# Patient Record
Sex: Male | Born: 1955
Health system: Southern US, Community
[De-identification: ages and names within clinical notes are randomized; demographics above are authoritative.]

## PROBLEM LIST (undated history)

## (undated) DIAGNOSIS — M545 Low back pain, unspecified: Secondary | ICD-10-CM

## (undated) DIAGNOSIS — I219 Acute myocardial infarction, unspecified: Secondary | ICD-10-CM

## (undated) DIAGNOSIS — N400 Enlarged prostate without lower urinary tract symptoms: Secondary | ICD-10-CM

## (undated) DIAGNOSIS — M1990 Unspecified osteoarthritis, unspecified site: Secondary | ICD-10-CM

## (undated) DIAGNOSIS — I2510 Atherosclerotic heart disease of native coronary artery without angina pectoris: Secondary | ICD-10-CM

## (undated) DIAGNOSIS — E785 Hyperlipidemia, unspecified: Secondary | ICD-10-CM

## (undated) DIAGNOSIS — N189 Chronic kidney disease, unspecified: Secondary | ICD-10-CM

## (undated) DIAGNOSIS — Z8669 Personal history of other diseases of the nervous system and sense organs: Secondary | ICD-10-CM

## (undated) DIAGNOSIS — I10 Essential (primary) hypertension: Secondary | ICD-10-CM

## (undated) DIAGNOSIS — R3911 Hesitancy of micturition: Secondary | ICD-10-CM

## (undated) HISTORY — DX: Low back pain, unspecified: M54.50

## (undated) HISTORY — DX: Unspecified osteoarthritis, unspecified site: M19.90

## (undated) HISTORY — DX: Essential (primary) hypertension: I10

## (undated) HISTORY — DX: Chronic kidney disease, unspecified: N18.9

## (undated) HISTORY — DX: Benign prostatic hyperplasia without lower urinary tract symptoms: N40.0

## (undated) HISTORY — DX: Acute myocardial infarction, unspecified: I21.9

## (undated) HISTORY — DX: Hesitancy of micturition: R39.11

## (undated) HISTORY — PX: OTHER SURGICAL HISTORY: SHX169

## (undated) HISTORY — DX: Atherosclerotic heart disease of native coronary artery without angina pectoris: I25.10

## (undated) HISTORY — DX: Low back pain: M54.5

## (undated) HISTORY — DX: Personal history of other diseases of the nervous system and sense organs: Z86.69

## (undated) HISTORY — DX: Hyperlipidemia, unspecified: E78.5

---

## 2003-06-14 ENCOUNTER — Other Ambulatory Visit: Payer: Self-pay

## 2004-12-11 ENCOUNTER — Emergency Department: Payer: Self-pay | Admitting: Emergency Medicine

## 2004-12-23 ENCOUNTER — Other Ambulatory Visit: Payer: Self-pay

## 2004-12-23 ENCOUNTER — Inpatient Hospital Stay: Payer: Self-pay | Admitting: Internal Medicine

## 2010-03-12 ENCOUNTER — Inpatient Hospital Stay: Payer: Self-pay | Admitting: Internal Medicine

## 2010-03-28 ENCOUNTER — Ambulatory Visit: Payer: Self-pay | Admitting: Internal Medicine

## 2010-11-07 ENCOUNTER — Inpatient Hospital Stay: Payer: Self-pay | Admitting: Internal Medicine

## 2010-12-04 ENCOUNTER — Ambulatory Visit: Payer: Self-pay | Admitting: "Endocrinology

## 2011-02-03 ENCOUNTER — Emergency Department: Payer: Self-pay | Admitting: Emergency Medicine

## 2013-07-02 ENCOUNTER — Emergency Department: Payer: Self-pay | Admitting: Emergency Medicine

## 2014-02-09 ENCOUNTER — Ambulatory Visit: Payer: Self-pay | Admitting: Family Medicine

## 2014-02-25 ENCOUNTER — Encounter: Payer: Self-pay | Admitting: Family Medicine

## 2014-03-20 ENCOUNTER — Encounter: Payer: Self-pay | Admitting: Family Medicine

## 2014-04-19 ENCOUNTER — Encounter: Payer: Self-pay | Admitting: Family Medicine

## 2014-05-03 ENCOUNTER — Ambulatory Visit: Payer: Self-pay | Admitting: Gastroenterology

## 2014-05-03 LAB — HM COLONOSCOPY

## 2014-05-20 ENCOUNTER — Encounter: Payer: Self-pay | Admitting: Family Medicine

## 2014-05-30 DIAGNOSIS — M4802 Spinal stenosis, cervical region: Secondary | ICD-10-CM | POA: Diagnosis not present

## 2014-05-30 DIAGNOSIS — M1811 Unilateral primary osteoarthritis of first carpometacarpal joint, right hand: Secondary | ICD-10-CM | POA: Diagnosis not present

## 2014-06-03 DIAGNOSIS — M4802 Spinal stenosis, cervical region: Secondary | ICD-10-CM | POA: Diagnosis not present

## 2014-06-03 DIAGNOSIS — M1811 Unilateral primary osteoarthritis of first carpometacarpal joint, right hand: Secondary | ICD-10-CM | POA: Diagnosis not present

## 2014-06-17 DIAGNOSIS — M4802 Spinal stenosis, cervical region: Secondary | ICD-10-CM | POA: Diagnosis not present

## 2014-06-17 DIAGNOSIS — M1811 Unilateral primary osteoarthritis of first carpometacarpal joint, right hand: Secondary | ICD-10-CM | POA: Diagnosis not present

## 2014-06-20 ENCOUNTER — Encounter: Payer: Self-pay | Admitting: Family Medicine

## 2014-08-24 DIAGNOSIS — I429 Cardiomyopathy, unspecified: Secondary | ICD-10-CM | POA: Diagnosis not present

## 2014-08-24 DIAGNOSIS — I2510 Atherosclerotic heart disease of native coronary artery without angina pectoris: Secondary | ICD-10-CM | POA: Diagnosis not present

## 2014-08-24 DIAGNOSIS — F17200 Nicotine dependence, unspecified, uncomplicated: Secondary | ICD-10-CM | POA: Diagnosis not present

## 2014-08-24 DIAGNOSIS — E669 Obesity, unspecified: Secondary | ICD-10-CM | POA: Diagnosis not present

## 2014-09-12 LAB — SURGICAL PATHOLOGY

## 2014-09-16 DIAGNOSIS — I10 Essential (primary) hypertension: Secondary | ICD-10-CM | POA: Diagnosis not present

## 2014-09-16 DIAGNOSIS — I129 Hypertensive chronic kidney disease with stage 1 through stage 4 chronic kidney disease, or unspecified chronic kidney disease: Secondary | ICD-10-CM | POA: Diagnosis not present

## 2014-09-16 DIAGNOSIS — E785 Hyperlipidemia, unspecified: Secondary | ICD-10-CM | POA: Diagnosis not present

## 2014-09-16 DIAGNOSIS — N400 Enlarged prostate without lower urinary tract symptoms: Secondary | ICD-10-CM | POA: Diagnosis not present

## 2014-09-16 DIAGNOSIS — N183 Chronic kidney disease, stage 3 (moderate): Secondary | ICD-10-CM | POA: Diagnosis not present

## 2014-09-16 DIAGNOSIS — R3911 Hesitancy of micturition: Secondary | ICD-10-CM | POA: Diagnosis not present

## 2015-02-23 DIAGNOSIS — I2510 Atherosclerotic heart disease of native coronary artery without angina pectoris: Secondary | ICD-10-CM | POA: Diagnosis not present

## 2015-02-23 DIAGNOSIS — I429 Cardiomyopathy, unspecified: Secondary | ICD-10-CM | POA: Diagnosis not present

## 2015-02-23 DIAGNOSIS — F17200 Nicotine dependence, unspecified, uncomplicated: Secondary | ICD-10-CM | POA: Diagnosis not present

## 2015-02-23 DIAGNOSIS — R0602 Shortness of breath: Secondary | ICD-10-CM | POA: Diagnosis not present

## 2015-02-23 DIAGNOSIS — I10 Essential (primary) hypertension: Secondary | ICD-10-CM | POA: Diagnosis not present

## 2015-02-23 DIAGNOSIS — E784 Other hyperlipidemia: Secondary | ICD-10-CM | POA: Diagnosis not present

## 2015-02-23 DIAGNOSIS — I208 Other forms of angina pectoris: Secondary | ICD-10-CM | POA: Diagnosis not present

## 2015-02-23 DIAGNOSIS — Z87438 Personal history of other diseases of male genital organs: Secondary | ICD-10-CM | POA: Diagnosis not present

## 2015-03-20 ENCOUNTER — Other Ambulatory Visit: Payer: Self-pay | Admitting: Family Medicine

## 2015-03-20 ENCOUNTER — Ambulatory Visit (INDEPENDENT_AMBULATORY_CARE_PROVIDER_SITE_OTHER): Payer: Commercial Managed Care - HMO | Admitting: Family Medicine

## 2015-03-20 ENCOUNTER — Encounter: Payer: Self-pay | Admitting: Family Medicine

## 2015-03-20 VITALS — BP 115/70 | HR 56 | Temp 97.2°F | Ht 63.6 in | Wt 188.0 lb

## 2015-03-20 DIAGNOSIS — N183 Chronic kidney disease, stage 3 unspecified: Secondary | ICD-10-CM

## 2015-03-20 DIAGNOSIS — N400 Enlarged prostate without lower urinary tract symptoms: Secondary | ICD-10-CM | POA: Diagnosis not present

## 2015-03-20 DIAGNOSIS — R3911 Hesitancy of micturition: Secondary | ICD-10-CM

## 2015-03-20 DIAGNOSIS — E785 Hyperlipidemia, unspecified: Secondary | ICD-10-CM

## 2015-03-20 DIAGNOSIS — I129 Hypertensive chronic kidney disease with stage 1 through stage 4 chronic kidney disease, or unspecified chronic kidney disease: Secondary | ICD-10-CM | POA: Diagnosis not present

## 2015-03-20 DIAGNOSIS — Z0000 Encounter for general adult medical examination without abnormal findings: Secondary | ICD-10-CM

## 2015-03-20 DIAGNOSIS — I2510 Atherosclerotic heart disease of native coronary artery without angina pectoris: Secondary | ICD-10-CM

## 2015-03-20 DIAGNOSIS — Z113 Encounter for screening for infections with a predominantly sexual mode of transmission: Secondary | ICD-10-CM

## 2015-03-20 DIAGNOSIS — N401 Enlarged prostate with lower urinary tract symptoms: Secondary | ICD-10-CM

## 2015-03-20 DIAGNOSIS — M545 Low back pain, unspecified: Secondary | ICD-10-CM | POA: Insufficient documentation

## 2015-03-20 DIAGNOSIS — Z23 Encounter for immunization: Secondary | ICD-10-CM | POA: Diagnosis not present

## 2015-03-20 DIAGNOSIS — G8929 Other chronic pain: Secondary | ICD-10-CM | POA: Insufficient documentation

## 2015-03-20 DIAGNOSIS — M778 Other enthesopathies, not elsewhere classified: Secondary | ICD-10-CM

## 2015-03-20 DIAGNOSIS — M659 Synovitis and tenosynovitis, unspecified: Secondary | ICD-10-CM | POA: Diagnosis not present

## 2015-03-20 LAB — UA/M W/RFLX CULTURE, ROUTINE
Bilirubin, UA: NEGATIVE
Glucose, UA: NEGATIVE
KETONES UA: NEGATIVE
LEUKOCYTES UA: NEGATIVE
Nitrite, UA: NEGATIVE
PROTEIN UA: NEGATIVE
RBC UA: NEGATIVE
SPEC GRAV UA: 1.02 (ref 1.005–1.030)
Urobilinogen, Ur: 0.2 mg/dL (ref 0.2–1.0)
pH, UA: 5 (ref 5.0–7.5)

## 2015-03-20 LAB — MICROALBUMIN, URINE WAIVED
Creatinine, Urine Waived: 200 mg/dL (ref 10–300)
Microalb, Ur Waived: 30 mg/L — ABNORMAL HIGH (ref 0–19)
Microalb/Creat Ratio: <30 mg/g (ref <30)

## 2015-03-20 MED ORDER — LISINOPRIL 40 MG PO TABS: 40.0000 mg | ORAL_TABLET | Freq: Every day | ORAL | Status: DC

## 2015-03-20 MED ORDER — SIMVASTATIN 40 MG PO TABS: 40.0000 mg | ORAL_TABLET | Freq: Every day | ORAL | Status: DC

## 2015-03-20 MED ORDER — METOPROLOL TARTRATE 25 MG PO TABS: 25.0000 mg | ORAL_TABLET | Freq: Two times a day (BID) | ORAL | Status: DC

## 2015-03-20 MED ORDER — TAMSULOSIN HCL 0.4 MG PO CAPS: 0.4000 mg | ORAL_CAPSULE | Freq: Every day | ORAL | Status: DC

## 2015-03-20 NOTE — Assessment & Plan Note (Signed)
Under good control on current regimen. Continue current regimen. Continue to monitor. PSA checked today.

## 2015-03-20 NOTE — Assessment & Plan Note (Signed)
On lisinopril. Continue current regimen. Continue to monitor.

## 2015-03-20 NOTE — Assessment & Plan Note (Signed)
Under good control on current regimen. Continue current regimen. Continue to monitor.  

## 2015-03-20 NOTE — Assessment & Plan Note (Signed)
Continue to follow with cardiology. Stable at this time.

## 2015-03-20 NOTE — Assessment & Plan Note (Signed)
Continue flomax. Continue to monitor.

## 2015-03-20 NOTE — Progress Notes (Signed)
BP 115/70 mmHg  Pulse 56  Temp(Src) 97.2 F (36.2 C)  Ht 5' 3.6" (1.615 m)  Wt 188 lb (85.276 kg)  BMI 32.70 kg/m2  SpO2 99%   Subjective:    Patient ID: Mike [name]., male    DOB: April 01, 1956, 59 y.o.   MRN: [id number]  HPI: [place] Norberto Nair [name] is a 59 y.o. male presenting on 03/20/2015 for comprehensive medical examination. Current medical complaints include:  Has been having some tendonitis in his elbows and his low back. Has been taking aleve for it at night  He currently lives with: friend Interim Problems from his last visit: no  Depression Screen done today and results listed below:  Depression screen [facility] 2/9 03/20/2015  Decreased Interest 0  Down, Depressed, Hopeless 0  PHQ - 2 Score 0    The patient does not have a history of falls. I did not complete a risk assessment for falls. A plan of care for falls was not documented.  Past Medical History:  Past Medical History  Diagnosis Date  . Hyperlipidemia   . Hypertension   . CAD (coronary artery disease)   . Urinary hesitancy   . Lumbago   . Chronic kidney disease   . Benign enlargement of prostate   . Arthritis   . Myocardial infarction ([place])   . History of seizures as a child     Due to head injury    Surgical History:  Past Surgical History  Procedure Laterality Date  . Coronary artery stent      Medications:  Current Outpatient Prescriptions on File Prior to Visit  Medication Sig  . EPINEPHrine (EPIPEN 2-PAK) 0.3 mg/0.3 mL IJ SOAJ injection Inject into the muscle once.   No current facility-administered medications on file prior to visit.    Allergies:  Allergies  Allergen Reactions  . Shellfish Allergy Anaphylaxis  . Peanuts [Peanut Oil] Rash  . Geralyn Flash [Fish Allergy] Hives    Social History:  Social History   Social History  . Marital Status: Single    Spouse Name: N/A  . Number of Children: N/A  . Years of Education: N/A   Occupational History  . Not on file.    Social History Main Topics  . Smoking status: Current Every Day Smoker -- 0.50 packs/day    Types: Cigarettes  . Smokeless tobacco: Never Used  . Alcohol Use: No  . Drug Use: No  . Sexual Activity: Yes    Birth Control/ Protection: None   Other Topics Concern  . Not on file   Social History Narrative   History  Smoking status  . Current Every Day Smoker -- 0.50 packs/day  . Types: Cigarettes  Smokeless tobacco  . Never Used   History  Alcohol Use No    Family History:  Family History  Problem Relation Age of Onset  . Cancer Mother     breast  . Hypertension Mother   . Hypertension Father   . Stroke Father   . Stroke Maternal Grandfather     Past medical history, surgical history, medications, allergies, family history and social history reviewed with patient today and changes made to appropriate areas of the chart.   Review of Systems  Constitutional: Negative.   HENT: Negative.   Eyes: Negative.   Respiratory: Negative.   Cardiovascular: Negative.   Gastrointestinal: Positive for constipation. Negative for heartburn, nausea, vomiting, abdominal pain, diarrhea, blood in stool and melena.  Genitourinary: Negative.  Hesitancy- flomax helps but not a whole lot, slow stream   Musculoskeletal: Negative.   Skin: Negative.   Neurological: Negative.   Endo/Heme/Allergies: Negative.   Psychiatric/Behavioral: Negative.     All other ROS negative except what is listed above and in the HPI.      Objective:    BP 115/70 mmHg  Pulse 56  Temp(Src) 97.2 F (36.2 C)  Ht 5' 3.6" (1.615 m)  Wt 188 lb (85.276 kg)  BMI 32.70 kg/m2  SpO2 99%  Wt Readings from Last 3 Encounters:  03/20/15 188 lb (85.276 kg)  09/16/14 195 lb (88.451 kg)    Physical Exam  Constitutional: He is oriented to person, place, and time. He appears well-developed and well-nourished. No distress.  HENT:  Head: Normocephalic and atraumatic.  Right Ear: Hearing and external ear  normal.  Left Ear: Hearing and external ear normal.  Nose: Nose normal.  Mouth/Throat: Oropharynx is clear and moist. No oropharyngeal exudate.  Eyes: Conjunctivae, EOM and lids are normal. Pupils are equal, round, and reactive to light. Right eye exhibits no discharge. Left eye exhibits no discharge. No scleral icterus.  Neck: Normal range of motion. Neck supple. No JVD present. No tracheal deviation present. No thyromegaly present.  Cardiovascular: Normal rate, regular rhythm, normal heart sounds and intact distal pulses.  Exam reveals no gallop and no friction rub.   No murmur heard. Pulmonary/Chest: Effort normal and breath sounds normal. No stridor. No respiratory distress.  Abdominal: Soft. Bowel sounds are normal. He exhibits no distension and no mass. There is no tenderness. There is no rebound and no guarding. Hernia confirmed negative in the right inguinal area and confirmed negative in the left inguinal area.  Genitourinary: Rectum normal, testes normal and penis normal. Prostate is enlarged. Prostate is not tender. Cremasteric reflex is present. Circumcised. No phimosis, hypospadias or penile tenderness.  Musculoskeletal: Normal range of motion. He exhibits no edema or tenderness.  Lymphadenopathy:    He has no cervical adenopathy.       Right: No inguinal adenopathy present.       Left: No inguinal adenopathy present.  Neurological: He is alert and oriented to person, place, and time. He has normal reflexes. He displays normal reflexes. No cranial nerve deficit. He exhibits normal muscle tone. Coordination normal.  Skin: Skin is warm, dry and intact. No rash noted. He is not diaphoretic. No erythema. No pallor.  Psychiatric: He has a normal mood and affect. His speech is normal and behavior is normal. Judgment and thought content normal. Cognition and memory are normal.  Nursing note and vitals reviewed.      Assessment & Plan:   Problem List Items Addressed This Visit       Cardiovascular and Mediastinum   CAD (coronary artery disease)    Continue to follow with cardiology. Stable at this time.       Relevant Medications   aspirin EC 81 MG tablet   lisinopril (PRINIVIL,ZESTRIL) 40 MG tablet   metoprolol tartrate (LOPRESSOR) 25 MG tablet   simvastatin (ZOCOR) 40 MG tablet     Genitourinary   BPH (benign prostatic hyperplasia)    Under good control on current regimen. Continue current regimen. Continue to monitor. PSA checked today.      Relevant Medications   tamsulosin (FLOMAX) 0.4 MG CAPS capsule   Benign hypertensive renal disease    Under good control on current regimen. Continue current regimen. Continue to monitor .      CKD (chronic  kidney disease), stage III    On lisinopril. Continue current regimen. Continue to monitor.         Other   HLD (hyperlipidemia)    Checking levels today. Await results. Continue to monitor.       Relevant Medications   aspirin EC 81 MG tablet   lisinopril (PRINIVIL,ZESTRIL) 40 MG tablet   metoprolol tartrate (LOPRESSOR) 25 MG tablet   simvastatin (ZOCOR) 40 MG tablet   Urinary hesitancy due to benign prostatic hyperplasia    Continue flomax. Continue to monitor.        Other Visit Diagnoses    Routine general medical examination at a health care facility    -  Primary    Up to date on vaccines. Colonoscopy up to date. Screening labs checked today. Aw    Routine screening for STI (sexually transmitted infection)        HIV and hep C checked today. Await results.     Relevant Orders    HIV antibody    Hepatitis C Antibody    Immunization due        Flu shot given today.    Relevant Orders    Flu Vaccine QUAD 36+ mos PF IM (Fluarix & Fluzone Quad PF)    Right elbow tendonitis        Due to lifting. Rx for brace given. Call if not getting better or getting worse.         Discussed aspirin prophylaxis for myocardial infarction prevention and decision was made to continue ASA  LABORATORY TESTING:   Health maintenance labs ordered today as discussed above.   The natural history of prostate cancer and ongoing controversy regarding screening and potential treatment outcomes of prostate cancer has been discussed with the patient. The meaning of a false positive PSA and a false negative PSA has been discussed. He indicates understanding of the limitations of this screening test and wishes to proceed with screening PSA testing.   IMMUNIZATIONS:   - Tdap: Tetanus vaccination status reviewed: last tetanus booster within 10 years. - Influenza: Administered today - Pneumovax: Up to date - Prevnar: Not applicable - Zostavax vaccine: Rx given today to have at the pharmacy  SCREENING: - Colonoscopy: Up to date  Discussed with patient purpose of the colonoscopy is to detect colon cancer at curable precancerous or early stages   - AAA Screening: Not applicable   PATIENT COUNSELING:    Sexuality: Discussed sexually transmitted diseases, partner selection, use of condoms, avoidance of unintended pregnancy  and contraceptive alternatives.   Advised to avoid cigarette smoking.  I discussed with the patient that most people either abstain from alcohol or drink within safe limits (<=14/week and <=4 drinks/occasion for males, <=7/weeks and <= 3 drinks/occasion for females) and that the risk for alcohol disorders and other health effects rises proportionally with the number of drinks per week and how often a drinker exceeds daily limits.  Discussed cessation/primary prevention of drug use and availability of treatment for abuse.   Diet: Encouraged to adjust caloric intake to maintain  or achieve ideal body weight, to reduce intake of dietary saturated fat and total fat, to limit sodium intake by avoiding high sodium foods and not adding table salt, and to maintain adequate dietary potassium and calcium preferably from fresh fruits, vegetables, and low-fat dairy products.    stressed the importance of  regular exercise  Injury prevention: Discussed safety belts, safety helmets, smoke detector, smoking near bedding or upholstery.  Dental health: Discussed importance of regular tooth brushing, flossing, and dental visits.   Follow up plan: NEXT PREVENTATIVE PHYSICAL DUE IN 1 YEAR. Return in about 6 months (around 09/17/2015) for Follow up BP and cholesterol.

## 2015-03-20 NOTE — Assessment & Plan Note (Signed)
Checking levels today. Await results. Continue to monitor.  

## 2015-03-20 NOTE — Patient Instructions (Signed)
Preventive Care for Adults, Male A healthy lifestyle and preventive care can promote health and wellness. Preventive health guidelines for men include the following key practices:  A routine yearly physical is a good way to check with your health care provider about your health and preventative screening. It is a chance to share any concerns and updates on your health and to receive a thorough exam.  Visit your dentist for a routine exam and preventative care every 6 months. Brush your teeth twice a day and floss once a day. Good oral hygiene prevents tooth decay and gum disease.  The frequency of eye exams is based on your age, health, family medical history, use of contact lenses, and other factors. Follow your health care provider's recommendations for frequency of eye exams.  Eat a healthy diet. Foods such as vegetables, fruits, whole grains, low-fat dairy products, and lean protein foods contain the nutrients you need without too many calories. Decrease your intake of foods high in solid fats, added sugars, and salt. Eat the right amount of calories for you.Get information about a proper diet from your health care provider, if necessary.  Regular physical exercise is one of the most important things you can do for your health. Most adults should get at least 150 minutes of moderate-intensity exercise (any activity that increases your heart rate and causes you to sweat) each week. In addition, most adults need muscle-strengthening exercises on 2 or more days a week.  Maintain a healthy weight. The body mass index (BMI) is a screening tool to identify possible weight problems. It provides an estimate of body fat based on height and weight. Your health care provider can find your BMI and can help you achieve or maintain a healthy weight.For adults 20 years and older:  A BMI below 18.5 is considered underweight.  A BMI of 18.5 to 24.9 is normal.  A BMI of 25 to 29.9 is considered  overweight.  A BMI of 30 and above is considered obese.  Maintain normal blood lipids and cholesterol levels by exercising and minimizing your intake of saturated fat. Eat a balanced diet with plenty of fruit and vegetables. Blood tests for lipids and cholesterol should begin at age 20 and be repeated every 5 years. If your lipid or cholesterol levels are high, you are over 50, or you are at high risk for heart disease, you may need your cholesterol levels checked more frequently.Ongoing high lipid and cholesterol levels should be treated with medicines if diet and exercise are not working.  If you smoke, find out from your health care provider how to quit. If you do not use tobacco, do not start.  Lung cancer screening is recommended for adults aged 55-80 years who are at high risk for developing lung cancer because of a history of smoking. A yearly low-dose CT scan of the lungs is recommended for people who have at least a 30-pack-year history of smoking and are a current smoker or have quit within the past 15 years. A pack year of smoking is smoking an average of 1 pack of cigarettes a day for 1 year (for example: 1 pack a day for 30 years or 2 packs a day for 15 years). Yearly screening should continue until the smoker has stopped smoking for at least 15 years. Yearly screening should be stopped for people who develop a health problem that would prevent them from having lung cancer treatment.  If you choose to drink alcohol, do not have more   than 2 drinks per day. One drink is considered to be 12 ounces (355 mL) of beer, 5 ounces (148 mL) of wine, or 1.5 ounces (44 mL) of liquor.  Avoid use of street drugs. Do not share needles with anyone. Ask for help if you need support or instructions about stopping the use of drugs.  High blood pressure causes heart disease and increases the risk of stroke. Your blood pressure should be checked at least every 1-2 years. Ongoing high blood pressure should be  treated with medicines, if weight loss and exercise are not effective.  If you are 34-90 years old, ask your health care provider if you should take aspirin to prevent heart disease.  Diabetes screening is done by taking a blood sample to check your blood glucose level after you have not eaten for a certain period of time (fasting). If you are not overweight and you do not have risk factors for diabetes, you should be screened once every 3 years starting at age 35. If you are overweight or obese and you are 70-84 years of age, you should be screened for diabetes every year as part of your cardiovascular risk assessment.  Colorectal cancer can be detected and often prevented. Most routine colorectal cancer screening begins at the age of 18 and continues through age 69. However, your health care provider may recommend screening at an earlier age if you have risk factors for colon cancer. On a yearly basis, your health care provider may provide home test kits to check for hidden blood in the stool. Use of a small camera at the end of a tube to directly examine the colon (sigmoidoscopy or colonoscopy) can detect the earliest forms of colorectal cancer. Talk to your health care provider about this at age 71, when routine screening begins. Direct exam of the colon should be repeated every 5-10 years through age 18, unless early forms of precancerous polyps or small growths are found.  People who are at an increased risk for hepatitis B should be screened for this virus. You are considered at high risk for hepatitis B if:  You were born in a country where hepatitis B occurs often. Talk with your health care provider about which countries are considered high risk.  Your parents were born in a high-risk country and you have not received a shot to protect against hepatitis B (hepatitis B vaccine).  You have HIV or AIDS.  You use needles to inject street drugs.  You live with, or have sex with, someone who  has hepatitis B.  You are a man who has sex with other men (MSM).  You get hemodialysis treatment.  You take certain medicines for conditions such as cancer, organ transplantation, and autoimmune conditions.  Hepatitis C blood testing is recommended for all people born from [birth year] through 1965 and any individual with known risks for hepatitis C.  Practice safe sex. Use condoms and avoid high-risk sexual practices to reduce the spread of sexually transmitted infections (STIs). STIs include gonorrhea, chlamydia, syphilis, trichomonas, herpes, HPV, and human immunodeficiency virus (HIV). Herpes, HIV, and HPV are viral illnesses that have no cure. They can result in disability, cancer, and death.  If you are a man who has sex with other men, you should be screened at least once per year for:  HIV.  Urethral, rectal, and pharyngeal infection of gonorrhea, chlamydia, or both.  If you are at risk of being infected with HIV, it is recommended that you take a  prescription medicine daily to prevent HIV infection. This is called preexposure prophylaxis (PrEP). You are considered at risk if:  You are a man who has sex with other men (MSM) and have other risk factors.  You are a heterosexual man, are sexually active, and are at increased risk for HIV infection.  You take drugs by injection.  You are sexually active with a partner who has HIV.  Talk with your health care provider about whether you are at high risk of being infected with HIV. If you choose to begin PrEP, you should first be tested for HIV. You should then be tested every 3 months for as long as you are taking PrEP.  A one-time screening for abdominal aortic aneurysm (AAA) and surgical repair of large AAAs by ultrasound are recommended for men ages 44 to 66 years who are current or former smokers.  Healthy men should no longer receive prostate-specific antigen (PSA) blood tests as part of routine cancer screening. Talk with your health  care provider about prostate cancer screening.  Testicular cancer screening is not recommended for adult males who have no symptoms. Screening includes self-exam, a health care provider exam, and other screening tests. Consult with your health care provider about any symptoms you have or any concerns you have about testicular cancer.  Use sunscreen. Apply sunscreen liberally and repeatedly throughout the day. You should seek shade when your shadow is shorter than you. Protect yourself by wearing long sleeves, pants, a wide-brimmed hat, and sunglasses year round, whenever you are outdoors.  Once a month, do a whole-body skin exam, using a mirror to look at the skin on your back. Tell your health care provider about new moles, moles that have irregular borders, moles that are larger than a pencil eraser, or moles that have changed in shape or color.  Stay current with required vaccines (immunizations).  Influenza vaccine. All adults should be immunized every year.  Tetanus, diphtheria, and acellular pertussis (Td, Tdap) vaccine. An adult who has not previously received Tdap or who does not know his vaccine status should receive 1 dose of Tdap. This initial dose should be followed by tetanus and diphtheria toxoids (Td) booster doses every 10 years. Adults with an unknown or incomplete history of completing a 3-dose immunization series with Td-containing vaccines should begin or complete a primary immunization series including a Tdap dose. Adults should receive a Td booster every 10 years.  Varicella vaccine. An adult without evidence of immunity to varicella should receive 2 doses or a second dose if he has previously received 1 dose.  Human papillomavirus (HPV) vaccine. Males aged 11-21 years who have not received the vaccine previously should receive the 3-dose series. Males aged 22-26 years may be immunized. Immunization is recommended through the age of 23 years for any male who has sex with males  and did not get any or all doses earlier. Immunization is recommended for any person with an immunocompromised condition through the age of 72 years if he did not get any or all doses earlier. During the 3-dose series, the second dose should be obtained 4-8 weeks after the first dose. The third dose should be obtained 24 weeks after the first dose and 16 weeks after the second dose.  Zoster vaccine. One dose is recommended for adults aged 23 years or older unless certain conditions are present.  Measles, mumps, and rubella (MMR) vaccine. Adults born before [birth year] generally are considered immune to measles and mumps. Adults born in [birth year]  or later should have 1 or more doses of MMR vaccine unless there is a contraindication to the vaccine or there is laboratory evidence of immunity to each of the three diseases. A routine second dose of MMR vaccine should be obtained at least 28 days after the first dose for students attending postsecondary schools, health care workers, or international travelers. People who received inactivated measles vaccine or an unknown type of measles vaccine during 1963-1967 should receive 2 doses of MMR vaccine. People who received inactivated mumps vaccine or an unknown type of mumps vaccine before 1979 and are at high risk for mumps infection should consider immunization with 2 doses of MMR vaccine. Unvaccinated health care workers born before [birth year] who lack laboratory evidence of measles, mumps, or rubella immunity or laboratory confirmation of disease should consider measles and mumps immunization with 2 doses of MMR vaccine or rubella immunization with 1 dose of MMR vaccine.  Pneumococcal 13-valent conjugate (PCV13) vaccine. When indicated, a person who is uncertain of his immunization history and has no record of immunization should receive the PCV13 vaccine. All adults 9 years of age and older should receive this vaccine. An adult aged 69 years or older who has certain medical  conditions and has not been previously immunized should receive 1 dose of PCV13 vaccine. This PCV13 should be followed with a dose of pneumococcal polysaccharide (PPSV23) vaccine. Adults who are at high risk for pneumococcal disease should obtain the PPSV23 vaccine at least 8 weeks after the dose of PCV13 vaccine. Adults older than 60 years of age who have normal immune system function should obtain the PPSV23 vaccine dose at least 1 year after the dose of PCV13 vaccine.  Pneumococcal polysaccharide (PPSV23) vaccine. When PCV13 is also indicated, PCV13 should be obtained first. All adults aged 79 years and older should be immunized. An adult younger than age 43 years who has certain medical conditions should be immunized. Any person who resides in a nursing home or long-term care facility should be immunized. An adult smoker should be immunized. People with an immunocompromised condition and certain other conditions should receive both PCV13 and PPSV23 vaccines. People with human immunodeficiency virus (HIV) infection should be immunized as soon as possible after diagnosis. Immunization during chemotherapy or radiation therapy should be avoided. Routine use of PPSV23 vaccine is not recommended for American Indians, [place] Natives, or people younger than 65 years unless there are medical conditions that require PPSV23 vaccine. When indicated, people who have unknown immunization and have no record of immunization should receive PPSV23 vaccine. One-time revaccination 5 years after the first dose of PPSV23 is recommended for people aged 19-64 years who have chronic kidney failure, nephrotic syndrome, asplenia, or immunocompromised conditions. People who received 1-2 doses of PPSV23 before age 70 years should receive another dose of PPSV23 vaccine at age 79 years or later if at least 5 years have passed since the previous dose. Doses of PPSV23 are not needed for people immunized with PPSV23 at or after age 55  years.  Meningococcal vaccine. Adults with asplenia or persistent complement component deficiencies should receive 2 doses of quadrivalent meningococcal conjugate (MenACWY-D) vaccine. The doses should be obtained at least 2 months apart. Microbiologists working with certain meningococcal bacteria, [place] recruits, people at risk during an outbreak, and people who travel to or live in countries with a high rate of meningitis should be immunized. A first-year college student up through age 64 years who is living in a residence hall should receive a  dose if he did not receive a dose on or after his 16th birthday. Adults who have certain high-risk conditions should receive one or more doses of vaccine.  Hepatitis A vaccine. Adults who wish to be protected from this disease, have chronic liver disease, work with hepatitis A-infected animals, work in hepatitis A research labs, or travel to or work in countries with a high rate of hepatitis A should be immunized. Adults who were previously unvaccinated and who anticipate close contact with an international adoptee during the first 60 days after arrival in the [place] from a country with a high rate of hepatitis A should be immunized.  Hepatitis B vaccine. Adults should be immunized if they wish to be protected from this disease, are under age 34 years and have diabetes, have chronic liver disease, have had more than one sex partner in the past 6 months, may be exposed to blood or other infectious body fluids, are household contacts or sex partners of hepatitis B positive people, are clients or workers in certain care facilities, or travel to or work in countries with a high rate of hepatitis B.  Haemophilus influenzae type b (Hib) vaccine. A previously unvaccinated person with asplenia or sickle cell disease or having a scheduled splenectomy should receive 1 dose of Hib vaccine. Regardless of previous immunization, a recipient of a hematopoietic stem cell  transplant should receive a 3-dose series 6-12 months after his successful transplant. Hib vaccine is not recommended for adults with HIV infection. Preventive Service / Frequency Ages 77 to 55  Blood pressure check.** / Every 3-5 years.  Lipid and cholesterol check.** / Every 5 years beginning at age 66.  Hepatitis C blood test.** / For any individual with known risks for hepatitis C.  Skin self-exam. / Monthly.  Influenza vaccine. / Every year.  Tetanus, diphtheria, and acellular pertussis (Tdap, Td) vaccine.** / Consult your health care provider. 1 dose of Td every 10 years.  Varicella vaccine.** / Consult your health care provider.  HPV vaccine. / 3 doses over 6 months, if 45 or younger.  Measles, mumps, rubella (MMR) vaccine.** / You need at least 1 dose of MMR if you were born in 1957 or later. You may also need a second dose.  Pneumococcal 13-valent conjugate (PCV13) vaccine.** / Consult your health care provider.  Pneumococcal polysaccharide (PPSV23) vaccine.** / 1 to 2 doses if you smoke cigarettes or if you have certain conditions.  Meningococcal vaccine.** / 1 dose if you are age 81 to 79 years and a [employer] researcher living in a residence hall, or have one of several medical conditions. You may also need additional booster doses.  Hepatitis A vaccine.** / Consult your health care provider.  Hepatitis B vaccine.** / Consult your health care provider.  Haemophilus influenzae type b (Hib) vaccine.** / Consult your health care provider. Ages 6 to 58  Blood pressure check.** / Every year.  Lipid and cholesterol check.** / Every 5 years beginning at age 89.  Lung cancer screening. / Every year if you are aged 84-80 years and have a 30-pack-year history of smoking and currently smoke or have quit within the past 15 years. Yearly screening is stopped once you have quit smoking for at least 15 years or develop a health problem that would prevent you from having  lung cancer treatment.  Fecal occult blood test (FOBT) of stool. / Every year beginning at age 90 and continuing until age 73. You may not have to do  this test if you get a colonoscopy every 10 years.  Flexible sigmoidoscopy** or colonoscopy.** / Every 5 years for a flexible sigmoidoscopy or every 10 years for a colonoscopy beginning at age 50 and continuing until age 75.  Hepatitis C blood test.** / For all people born from 1945 through 1965 and any individual with known risks for hepatitis C.  Skin self-exam. / Monthly.  Influenza vaccine. / Every year.  Tetanus, diphtheria, and acellular pertussis (Tdap/Td) vaccine.** / Consult your health care provider. 1 dose of Td every 10 years.  Varicella vaccine.** / Consult your health care provider.  Zoster vaccine.** / 1 dose for adults aged 60 years or older.  Measles, mumps, rubella (MMR) vaccine.** / You need at least 1 dose of MMR if you were born in 1957 or later. You may also need a second dose.  Pneumococcal 13-valent conjugate (PCV13) vaccine.** / Consult your health care provider.  Pneumococcal polysaccharide (PPSV23) vaccine.** / 1 to 2 doses if you smoke cigarettes or if you have certain conditions.  Meningococcal vaccine.** / Consult your health care provider.  Hepatitis A vaccine.** / Consult your health care provider.  Hepatitis B vaccine.** / Consult your health care provider.  Haemophilus influenzae type b (Hib) vaccine.** / Consult your health care provider. Ages 65 and over  Blood pressure check.** / Every year.  Lipid and cholesterol check.**/ Every 5 years beginning at age 20.  Lung cancer screening. / Every year if you are aged 55-80 years and have a 30-pack-year history of smoking and currently smoke or have quit within the past 15 years. Yearly screening is stopped once you have quit smoking for at least 15 years or develop a health problem that would prevent you from having lung cancer treatment.  Fecal  occult blood test (FOBT) of stool. / Every year beginning at age 50 and continuing until age 75. You may not have to do this test if you get a colonoscopy every 10 years.  Flexible sigmoidoscopy** or colonoscopy.** / Every 5 years for a flexible sigmoidoscopy or every 10 years for a colonoscopy beginning at age 50 and continuing until age 75.  Hepatitis C blood test.** / For all people born from 1945 through 1965 and any individual with known risks for hepatitis C.  Abdominal aortic aneurysm (AAA) screening.** / A one-time screening for ages 65 to 75 years who are current or former smokers.  Skin self-exam. / Monthly.  Influenza vaccine. / Every year.  Tetanus, diphtheria, and acellular pertussis (Tdap/Td) vaccine.** / 1 dose of Td every 10 years.  Varicella vaccine.** / Consult your health care provider.  Zoster vaccine.** / 1 dose for adults aged 60 years or older.  Pneumococcal 13-valent conjugate (PCV13) vaccine.** / 1 dose for all adults aged 65 years and older.  Pneumococcal polysaccharide (PPSV23) vaccine.** / 1 dose for all adults aged 65 years and older.  Meningococcal vaccine.** / Consult your health care provider.  Hepatitis A vaccine.** / Consult your health care provider.  Hepatitis B vaccine.** / Consult your health care provider.  Haemophilus influenzae type b (Hib) vaccine.** / Consult your health care provider. **Family history and personal history of risk and conditions may change your health care provider's recommendations.   This information is not intended to replace advice given to you by your health care provider. Make sure you discuss any questions you have with your health care provider.   Document Released: 07/02/2001 Document Revised: 05/27/2014 Document Reviewed: 10/01/2010 Elsevier Interactive Patient Education 2016   [place] Can Quit Smoking If you are ready to quit smoking or are thinking about it, congratulations! You have chosen to help  yourself be healthier and live longer! There are lots of different ways to quit smoking. Nicotine gum, nicotine patches, a nicotine inhaler, or nicotine nasal spray can help with physical craving. Hypnosis, support groups, and medicines help break the habit of smoking. TIPS TO GET OFF AND STAY OFF CIGARETTES  Learn to predict your moods. Do not let a bad situation be your excuse to have a cigarette. Some situations in your life might tempt you to have a cigarette.  Ask friends and co-workers not to smoke around you.  Make your home smoke-free.  Never have "just one" cigarette. It leads to wanting another and another. Remind yourself of your decision to quit.  On a card, make a list of your reasons for not smoking. Read it at least the same number of times a day as you have a cigarette. Tell yourself everyday, "I do not want to smoke. I choose not to smoke."  Ask someone at home or work to help you with your plan to quit smoking.  Have something planned after you eat or have a cup of coffee. Take a walk or get other exercise to perk you up. This will help to keep you from overeating.  Try a relaxation exercise to calm you down and decrease your stress. Remember, you may be tense and nervous the first two weeks after you quit. This will pass.  Find new activities to keep your hands busy. Play with a pen, coin, or rubber band. Doodle or draw things on paper.  Brush your teeth right after eating. This will help cut down the craving for the taste of tobacco after meals. You can try mouthwash too.  Try gum, breath mints, or diet candy to keep something in your mouth. IF YOU SMOKE AND WANT TO QUIT:  Do not stock up on cigarettes. Never buy a carton. Wait until one pack is finished before you buy another.  Never carry cigarettes with you at work or at home.  Keep cigarettes as far away from you as possible. Leave them with someone else.  Never carry matches or a lighter with you.  Ask  yourself, "Do I need this cigarette or is this just a reflex?"  Bet with someone that you can quit. Put cigarette money in a piggy bank every morning. If you smoke, you give up the money. If you do not smoke, by the end of the week, you keep the money.  Keep trying. It takes 21 days to change a habit!  Talk to your doctor about using medicines to help you quit. These include nicotine replacement gum, lozenges, or skin patches.   This information is not intended to replace advice given to you by your health care provider. Make sure you discuss any questions you have with your health care provider.   Document Released: 03/02/2009 Document Revised: 07/29/2011 Document Reviewed: 03/02/2009 Elsevier Interactive Patient Education [employer].

## 2015-03-21 ENCOUNTER — Encounter: Payer: Self-pay | Admitting: Family Medicine

## 2015-03-21 LAB — CBC WITH DIFFERENTIAL/PLATELET
Basophils Absolute: 0.1 10*3/uL (ref 0.0–0.2)
Basos: 1 %
EOS (ABSOLUTE): 0.6 10*3/uL — ABNORMAL HIGH (ref 0.0–0.4)
EOS: 9 %
HEMATOCRIT: 43.2 % (ref 37.5–51.0)
Hemoglobin: 14.6 g/dL (ref 12.6–17.7)
Immature Grans (Abs): 0 10*3/uL (ref 0.0–0.1)
Immature Granulocytes: 0 %
LYMPHS ABS: 2.3 10*3/uL (ref 0.7–3.1)
Lymphs: 33 %
MCH: 26.2 pg — ABNORMAL LOW (ref 26.6–33.0)
MCHC: 33.8 g/dL (ref 31.5–35.7)
MCV: 78 fL — ABNORMAL LOW (ref 79–97)
MONOS ABS: 0.6 10*3/uL (ref 0.1–0.9)
Monocytes: 9 %
Neutrophils Absolute: 3.2 10*3/uL (ref 1.4–7.0)
Neutrophils: 48 %
Platelets: 192 10*3/uL (ref 150–379)
RBC: 5.57 x10E6/uL (ref 4.14–5.80)
RDW: 14.5 % (ref 12.3–15.4)
WBC: 6.8 10*3/uL (ref 3.4–10.8)

## 2015-03-21 LAB — COMPREHENSIVE METABOLIC PANEL
ALBUMIN: 4.1 g/dL (ref 3.6–4.8)
ALT: 14 IU/L (ref 0–44)
AST: 24 IU/L (ref 0–40)
Albumin/Globulin Ratio: 1.6 (ref 1.1–2.5)
Alkaline Phosphatase: 144 IU/L — ABNORMAL HIGH (ref 39–117)
BILIRUBIN TOTAL: 0.4 mg/dL (ref 0.0–1.2)
BUN / CREAT RATIO: 14 (ref 10–22)
BUN: 20 mg/dL (ref 8–27)
CO2: 23 mmol/L (ref 18–29)
CREATININE: 1.39 mg/dL — AB (ref 0.76–1.27)
Calcium: 9.2 mg/dL (ref 8.6–10.2)
Chloride: 101 mmol/L (ref 97–106)
GFR calc non Af Amer: 55 mL/min/{1.73_m2} — ABNORMAL LOW (ref >59)
GFR, EST AFRICAN AMERICAN: 63 mL/min/{1.73_m2} (ref >59)
GLOBULIN, TOTAL: 2.5 g/dL (ref 1.5–4.5)
Glucose: 90 mg/dL (ref 65–99)
Potassium: 4.4 mmol/L (ref 3.5–5.2)
SODIUM: 139 mmol/L (ref 136–144)
TOTAL PROTEIN: 6.6 g/dL (ref 6.0–8.5)

## 2015-03-21 LAB — LIPID PANEL W/O CHOL/HDL RATIO
Cholesterol, Total: 147 mg/dL (ref 100–199)
HDL: 55 mg/dL (ref >39)
LDL CALC: 63 mg/dL (ref 0–99)
Triglycerides: 146 mg/dL (ref 0–149)
VLDL CHOLESTEROL CAL: 29 mg/dL (ref 5–40)

## 2015-03-21 LAB — HEPATITIS C ANTIBODY: Hep C Virus Ab: <0.1 s/co ratio (ref 0.0–0.9)

## 2015-03-21 LAB — HIV ANTIBODY (ROUTINE TESTING W REFLEX): HIV SCREEN 4TH GENERATION: NONREACTIVE

## 2015-03-21 LAB — TSH: TSH: 1.26 u[IU]/mL (ref 0.450–4.500)

## 2015-03-21 LAB — PSA: Prostate Specific Ag, Serum: 0.4 ng/mL (ref 0.0–4.0)

## 2015-09-18 ENCOUNTER — Ambulatory Visit (INDEPENDENT_AMBULATORY_CARE_PROVIDER_SITE_OTHER): Payer: Commercial Managed Care - HMO | Admitting: Family Medicine

## 2015-09-18 ENCOUNTER — Encounter: Payer: Self-pay | Admitting: Family Medicine

## 2015-09-18 VITALS — BP 129/73 | HR 67 | Temp 98.6°F | Ht 64.8 in | Wt 192.0 lb

## 2015-09-18 DIAGNOSIS — N400 Enlarged prostate without lower urinary tract symptoms: Secondary | ICD-10-CM | POA: Diagnosis not present

## 2015-09-18 DIAGNOSIS — I129 Hypertensive chronic kidney disease with stage 1 through stage 4 chronic kidney disease, or unspecified chronic kidney disease: Secondary | ICD-10-CM | POA: Diagnosis not present

## 2015-09-18 DIAGNOSIS — E785 Hyperlipidemia, unspecified: Secondary | ICD-10-CM | POA: Diagnosis not present

## 2015-09-18 LAB — LIPID PANEL PICCOLO, WAIVED
CHOLESTEROL PICCOLO, WAIVED: 138 mg/dL (ref <200)
Chol/HDL Ratio Piccolo,Waive: 2.2 mg/dL
HDL Chol Piccolo, Waived: 63 mg/dL (ref >59)
LDL CHOL CALC PICCOLO WAIVED: 64 mg/dL (ref <100)
Triglycerides Piccolo,Waived: 54 mg/dL (ref <150)
VLDL Chol Calc Piccolo,Waive: 11 mg/dL (ref <30)

## 2015-09-18 MED ORDER — SIMVASTATIN 40 MG PO TABS: 40.0000 mg | ORAL_TABLET | Freq: Every day | ORAL | Status: DC

## 2015-09-18 MED ORDER — LISINOPRIL 40 MG PO TABS: 40.0000 mg | ORAL_TABLET | Freq: Every day | ORAL | Status: DC

## 2015-09-18 MED ORDER — METOPROLOL TARTRATE 25 MG PO TABS: 25.0000 mg | ORAL_TABLET | Freq: Two times a day (BID) | ORAL | Status: DC

## 2015-09-18 MED ORDER — TAMSULOSIN HCL 0.4 MG PO CAPS: 0.4000 mg | ORAL_CAPSULE | Freq: Every day | ORAL | Status: DC

## 2015-09-18 NOTE — Assessment & Plan Note (Signed)
Slightly exacerbated at this time. Not sure if he wants to add anything. Will look over information and let us know if he wants to start it.

## 2015-09-18 NOTE — Progress Notes (Signed)
BP 129/73 mmHg  Pulse 67  Temp(Src) 98.6 F (37 C)  Ht 5' 4.8" (1.646 m)  Wt 192 lb (87.091 kg)  BMI 32.15 kg/m2  SpO2 99%   Subjective:    Patient ID: Mike [name]., male    DOB: 04/21/1956, 60 y.o.   MRN: [id number]  HPI: Mike [name]. is a 60 y.o. male  Chief Complaint  Patient presents with  . Hypertension  . Hyperlipidemia  . Benign Prostatic Hypertrophy   HYPERTENSION / [place] Satisfied with current treatment? yes Duration of hypertension: chronic BP monitoring frequency: not checking BP medication side effects: no Duration of hyperlipidemia: chronic Cholesterol medication side effects: no Cholesterol supplements: none Medication compliance: excellent compliance Aspirin: yes Recent stressors: no Recurrent headaches: no Visual changes: no Palpitations: no Dyspnea: no Chest pain: no Lower extremity edema: no Dizzy/lightheaded: no  BPH BPH status: slightly exacerbated Satisfied with current treatment?: yes Medication side effects: no Medication compliance: excellent compliance Duration: chronic Nocturia: 2-3x per night Urinary frequency:yes Incomplete voiding: yes Urgency: no Weak urinary stream: no Straining to start stream: yes Dysuria: no Onset: gradual Severity: fluctuating  Relevant past medical, surgical, family and social history reviewed and updated as indicated. Interim medical history since our last visit reviewed. Allergies and medications reviewed and updated.  Review of Systems  Constitutional: Negative.   Respiratory: Negative.   Cardiovascular: Negative.   Genitourinary: Positive for frequency and difficulty urinating. Negative for dysuria, urgency, hematuria, flank pain, decreased urine volume, discharge, penile swelling, scrotal swelling, enuresis, genital sores, penile pain and testicular pain.  Psychiatric/Behavioral: Negative.     Per HPI unless specifically indicated above     Objective:     BP 129/73 mmHg  Pulse 67  Temp(Src) 98.6 F (37 C)  Ht 5' 4.8" (1.646 m)  Wt 192 lb (87.091 kg)  BMI 32.15 kg/m2  SpO2 99%  Wt Readings from Last 3 Encounters:  09/18/15 192 lb (87.091 kg)  03/20/15 188 lb (85.276 kg)  09/16/14 195 lb (88.451 kg)    Physical Exam  Results for orders placed or performed in visit on 03/20/15  CBC with Differential/Platelet  Result Value Ref Range   WBC 6.8 3.4 - 10.8 x10E3/uL   RBC 5.57 4.14 - 5.80 x10E6/uL   Hemoglobin 14.6 12.6 - 17.7 g/dL   Hematocrit 43.2 37.5 - 51.0 %   MCV 78 (L) 79 - 97 fL   MCH 26.2 (L) 26.6 - 33.0 pg   MCHC 33.8 31.5 - 35.7 g/dL   RDW 14.5 12.3 - 15.4 %   Platelets 192 150 - 379 x10E3/uL   Neutrophils 48 %   Lymphs 33 %   Monocytes 9 %   Eos 9 %   Basos 1 %   Neutrophils Absolute 3.2 1.4 - 7.0 x10E3/uL   Lymphocytes Absolute 2.3 0.7 - 3.1 x10E3/uL   Monocytes Absolute 0.6 0.1 - 0.9 x10E3/uL   EOS (ABSOLUTE) 0.6 (H) 0.0 - 0.4 x10E3/uL   Basophils Absolute 0.1 0.0 - 0.2 x10E3/uL   Immature Granulocytes 0 %   Immature Grans (Abs) 0.0 0.0 - 0.1 x10E3/uL  Comprehensive metabolic panel  Result Value Ref Range   Glucose 90 65 - 99 mg/dL   BUN 20 8 - 27 mg/dL   Creatinine, Ser 1.39 (H) 0.76 - 1.27 mg/dL   GFR calc non Af Amer 55 (L) >59 mL/min/1.73   GFR calc Af Amer 63 >59 mL/min/1.73   BUN/Creatinine Ratio 14 10 - 22   Sodium  139 136 - 144 mmol/L   Potassium 4.4 3.5 - 5.2 mmol/L   Chloride 101 97 - 106 mmol/L   CO2 23 18 - 29 mmol/L   Calcium 9.2 8.6 - 10.2 mg/dL   Total Protein 6.6 6.0 - 8.5 g/dL   Albumin 4.1 3.6 - 4.8 g/dL   Globulin, Total 2.5 1.5 - 4.5 g/dL   Albumin/Globulin Ratio 1.6 1.1 - 2.5   Bilirubin Total 0.4 0.0 - 1.2 mg/dL   Alkaline Phosphatase 144 (H) 39 - 117 IU/L   AST 24 0 - 40 IU/L   ALT 14 0 - 44 IU/L  Lipid Panel w/o Chol/HDL Ratio  Result Value Ref Range   Cholesterol, Total 147 100 - 199 mg/dL   Triglycerides 146 0 - 149 mg/dL   HDL 55 >39 mg/dL   VLDL Cholesterol Cal 29 5 -  40 mg/dL   LDL Calculated 63 0 - 99 mg/dL  Microalbumin, Urine Waived  Result Value Ref Range   Microalb, Ur Waived 30 (H) 0 - 19 mg/L   Creatinine, Urine Waived 200 10 - 300 mg/dL   Microalb/Creat Ratio <30 <30 mg/g  PSA  Result Value Ref Range   Prostate Specific Ag, Serum 0.4 0.0 - 4.0 ng/mL  TSH  Result Value Ref Range   TSH 1.260 0.450 - 4.500 uIU/mL  UA/M w/rflx Culture, Routine  Result Value Ref Range   Specific Gravity, UA 1.020 1.005 - 1.030   pH, UA 5.0 5.0 - 7.5   Color, UA Yellow Yellow   Appearance Ur Clear Clear   Leukocytes, UA Negative Negative   Protein, UA Negative Negative/Trace   Glucose, UA Negative Negative   Ketones, UA Negative Negative   RBC, UA Negative Negative   Bilirubin, UA Negative Negative   Urobilinogen, Ur 0.2 0.2 - 1.0 mg/dL   Nitrite, UA Negative Negative  HIV antibody  Result Value Ref Range   HIV Screen 4th Generation wRfx Non Reactive Non Reactive  Hepatitis C Antibody  Result Value Ref Range   Hep C Virus Ab <0.1 0.0 - 0.9 s/co ratio  HM COLONOSCOPY  Result Value Ref Range   HM Colonoscopy Due in 5 years       Assessment & Plan:   Problem List Items Addressed This Visit      Genitourinary   BPH (benign prostatic hyperplasia)    Slightly exacerbated at this time. Not sure if he wants to add anything. Will look over information and let us know if he wants to start it.       Relevant Medications   tamsulosin (FLOMAX) 0.4 MG CAPS capsule   Benign hypertensive renal disease    Under good control. Continue current regimen. Continue to monitor.       Relevant Orders   Comprehensive metabolic panel     Other   HLD (hyperlipidemia) - Primary    Under good control. Continue current regimen. Continue to monitor.       Relevant Medications   simvastatin (ZOCOR) 40 MG tablet   lisinopril (PRINIVIL,ZESTRIL) 40 MG tablet   metoprolol tartrate (LOPRESSOR) 25 MG tablet   Other Relevant Orders   Lipid Panel [place], Waived        Follow up plan: Return in about 6 months (around 03/20/2016) for physical.

## 2015-09-18 NOTE — Assessment & Plan Note (Signed)
Under good control. Continue current regimen. Continue to monitor.  

## 2015-09-18 NOTE — Patient Instructions (Signed)
Dutasteride capsules What is this medicine? DUTASTERIDE (doo TAS teer ide) is used to treat benign prostatic hyperplasia (BPH) in men. This is a condition that causes you to have an enlarged prostate. This medicine helps to control your symptoms, decrease urinary retention, and reduces your risk of needing surgery. This medicine may be used for other purposes; ask your health care provider or pharmacist if you have questions. What should I tell my health care provider before I take this medicine? They need to know if you have any of these conditions: -liver disease -prostate cancer -an unusual or allergic reaction to dutasteride, finasteride, other medicines, foods, dyes, or preservatives -pregnant or trying to get pregnant -breast-feeding How should I use this medicine? Take this medicine by mouth with a glass of water. Follow the directions on the prescription label. Do not cut, crush, chew or open this medicine. You can take this medicine with or without food. Take your doses at regular intervals. Do not take your medicine more often than directed. Do not stop taking except on your doctor's advice. Talk to your pediatrician regarding the use of this medicine in children. Special care may be needed. Overdosage: If you think you have taken too much of this medicine contact a poison control center or emergency room at once. NOTE: This medicine is only for you. Do not share this medicine with others. What if I miss a dose? If you miss a dose, take it as soon as you can. If it is almost time for your next dose, take only that dose. Do not take double or extra doses. What may interact with this medicine? -antiviral medicines for HIV or AIDS -boceprevir -certain medicines for fungal infections like ketoconazole, itraconazole, voriconazole -certain medicines for infection like erythromycin, telithromycin -cimetidine -diltiazem -saw palmetto or other dietary supplements -verapamil This list may  not describe all possible interactions. Give your health care provider a list of all the medicines, herbs, non-prescription drugs, or dietary supplements you use. Also tell them if you smoke, drink alcohol, or use illegal drugs. Some items may interact with your medicine. What should I watch for while using this medicine? Do not donate blood while you are taking this medicine. This will prevent giving this medicine to a pregnant male through a blood transfusion. Ask your doctor or health care professional when it is safe to donate blood after you stop taking this medicine. Contact your doctor or health care professional if your symptoms do not start to get better. You may need to take this medicine for 6 to 12 months to get the best results. Women who are pregnant or may get pregnant must not handle this medicine. The active ingredient could harm the unborn baby. If a pregnant woman or woman who may become pregnant comes into contact with a leaking capsule, she should wash the exposed area of skin with soap and water immediately and check with her doctor or health care professional. This medicine can interfere with PSA laboratory tests for prostate cancer. If you are scheduled to have a lab test for prostate cancer, tell your doctor or health care professional that you are taking this medicine. What side effects may I notice from receiving this medicine? Side effects that you should report to your doctor or health care professional as soon as possible: -allergic reactions like skin rash, itching or hives, swelling of the face, lips, or tongue -changes in breast like lumps, pain or fluids leaking from the nipple -pain in the testicles Side  effects that usually do not require medical attention (report to your doctor or health care professional if they continue or are bothersome): -change in sex drive or performance This list may not describe all possible side effects. Call your doctor for medical advice  about side effects. You may report side effects to FDA at 1-800-FDA-1088. Where should I keep my medicine? Keep out of the reach of children. Store at room temperature between 15 and 30 degrees C (59 and 86 degrees F). Keep container tightly closed. Throw away any unused medicine after the expiration date. NOTE: This sheet is a summary. It may not cover all possible information. If you have questions about this medicine, talk to your doctor, pharmacist, or health care provider.    2016, [employer]. (2014-12-22 21:05:59)

## 2015-09-19 ENCOUNTER — Encounter: Payer: Self-pay | Admitting: Family Medicine

## 2015-09-19 LAB — COMPREHENSIVE METABOLIC PANEL
A/G RATIO: 1.7 (ref 1.2–2.2)
ALT: 13 IU/L (ref 0–44)
AST: 20 IU/L (ref 0–40)
Albumin: 4.1 g/dL (ref 3.6–4.8)
Alkaline Phosphatase: 125 IU/L — ABNORMAL HIGH (ref 39–117)
BUN/Creatinine Ratio: 10 (ref 10–24)
BUN: 14 mg/dL (ref 8–27)
Bilirubin Total: 0.5 mg/dL (ref 0.0–1.2)
CO2: 25 mmol/L (ref 18–29)
Calcium: 9.7 mg/dL (ref 8.6–10.2)
Chloride: 105 mmol/L (ref 96–106)
Creatinine, Ser: 1.4 mg/dL — ABNORMAL HIGH (ref 0.76–1.27)
GFR calc Af Amer: 62 mL/min/{1.73_m2} (ref >59)
GFR, EST NON AFRICAN AMERICAN: 54 mL/min/{1.73_m2} — AB (ref >59)
Globulin, Total: 2.4 g/dL (ref 1.5–4.5)
Glucose: 75 mg/dL (ref 65–99)
POTASSIUM: 4.1 mmol/L (ref 3.5–5.2)
Sodium: 143 mmol/L (ref 134–144)
Total Protein: 6.5 g/dL (ref 6.0–8.5)

## 2016-01-12 ENCOUNTER — Telehealth: Payer: Self-pay

## 2016-01-12 MED ORDER — TAMSULOSIN HCL 0.4 MG PO CAPS: 0.4000 mg | ORAL_CAPSULE | Freq: Every day | ORAL | 1 refills | Status: DC

## 2016-01-12 MED ORDER — METOPROLOL TARTRATE 25 MG PO TABS: 25.0000 mg | ORAL_TABLET | Freq: Two times a day (BID) | ORAL | 1 refills | Status: DC

## 2016-01-12 MED ORDER — LISINOPRIL 40 MG PO TABS: 40.0000 mg | ORAL_TABLET | Freq: Every day | ORAL | 1 refills | Status: DC

## 2016-01-12 MED ORDER — SIMVASTATIN 40 MG PO TABS: 40.0000 mg | ORAL_TABLET | Freq: Every day | ORAL | 1 refills | Status: DC

## 2016-01-12 NOTE — Telephone Encounter (Signed)
Please send 90 day supplies of the following to [facility]: Lisinopril Metoprolol   Simvastatin Tamsulosin

## 2016-01-12 NOTE — Telephone Encounter (Signed)
Rx's sent to pharmacy.  

## 2016-03-08 ENCOUNTER — Encounter (INDEPENDENT_AMBULATORY_CARE_PROVIDER_SITE_OTHER): Payer: Self-pay

## 2016-03-26 ENCOUNTER — Encounter: Payer: Commercial Managed Care - HMO | Admitting: Family Medicine

## 2016-04-02 ENCOUNTER — Ambulatory Visit (INDEPENDENT_AMBULATORY_CARE_PROVIDER_SITE_OTHER): Payer: Commercial Managed Care - HMO | Admitting: Family Medicine

## 2016-04-02 ENCOUNTER — Encounter: Payer: Self-pay | Admitting: Family Medicine

## 2016-04-02 ENCOUNTER — Other Ambulatory Visit: Payer: Self-pay | Admitting: Family Medicine

## 2016-04-02 VITALS — BP 119/55 | HR 58 | Temp 97.5°F | Ht 64.3 in | Wt 192.8 lb

## 2016-04-02 DIAGNOSIS — Z87891 Personal history of nicotine dependence: Secondary | ICD-10-CM | POA: Insufficient documentation

## 2016-04-02 DIAGNOSIS — E782 Mixed hyperlipidemia: Secondary | ICD-10-CM

## 2016-04-02 DIAGNOSIS — Z23 Encounter for immunization: Secondary | ICD-10-CM

## 2016-04-02 DIAGNOSIS — I129 Hypertensive chronic kidney disease with stage 1 through stage 4 chronic kidney disease, or unspecified chronic kidney disease: Secondary | ICD-10-CM | POA: Diagnosis not present

## 2016-04-02 DIAGNOSIS — N401 Enlarged prostate with lower urinary tract symptoms: Secondary | ICD-10-CM

## 2016-04-02 DIAGNOSIS — Z0000 Encounter for general adult medical examination without abnormal findings: Secondary | ICD-10-CM

## 2016-04-02 DIAGNOSIS — R3911 Hesitancy of micturition: Secondary | ICD-10-CM

## 2016-04-02 DIAGNOSIS — N183 Chronic kidney disease, stage 3 unspecified: Secondary | ICD-10-CM

## 2016-04-02 DIAGNOSIS — I2510 Atherosclerotic heart disease of native coronary artery without angina pectoris: Secondary | ICD-10-CM

## 2016-04-02 DIAGNOSIS — Z720 Tobacco use: Secondary | ICD-10-CM | POA: Diagnosis not present

## 2016-04-02 LAB — UA/M W/RFLX CULTURE, ROUTINE
Bilirubin, UA: NEGATIVE
Glucose, UA: NEGATIVE
Ketones, UA: NEGATIVE
LEUKOCYTES UA: NEGATIVE
NITRITE UA: NEGATIVE
PH UA: 5.5 (ref 5.0–7.5)
Protein, UA: NEGATIVE
RBC, UA: NEGATIVE
Specific Gravity, UA: 1.015 (ref 1.005–1.030)
Urobilinogen, Ur: 0.2 mg/dL (ref 0.2–1.0)

## 2016-04-02 LAB — LIPID PANEL PICCOLO, WAIVED
Chol/HDL Ratio Piccolo,Waive: 2.7 mg/dL
Cholesterol Piccolo, Waived: 142 mg/dL (ref <200)
HDL CHOL PICCOLO, WAIVED: 53 mg/dL — AB (ref >59)
LDL CHOL CALC PICCOLO WAIVED: 78 mg/dL (ref <100)
TRIGLYCERIDES PICCOLO,WAIVED: 53 mg/dL (ref <150)
VLDL CHOL CALC PICCOLO,WAIVE: 11 mg/dL (ref <30)

## 2016-04-02 LAB — MICROALBUMIN, URINE WAIVED
Creatinine, Urine Waived: 200 mg/dL (ref 10–300)
MICROALB, UR WAIVED: 10 mg/L (ref 0–19)
Microalb/Creat Ratio: <30 mg/g (ref <30)

## 2016-04-02 MED ORDER — VARENICLINE TARTRATE 1 MG PO TABS: 1.0000 mg | ORAL_TABLET | Freq: Two times a day (BID) | ORAL | 2 refills | Status: DC

## 2016-04-02 MED ORDER — TAMSULOSIN HCL 0.4 MG PO CAPS: 0.4000 mg | ORAL_CAPSULE | Freq: Every day | ORAL | 1 refills | Status: DC

## 2016-04-02 MED ORDER — VARENICLINE TARTRATE 0.5 MG X 11 & 1 MG X 42 PO MISC: ORAL | 0 refills | Status: DC

## 2016-04-02 MED ORDER — SIMVASTATIN 40 MG PO TABS: 40.0000 mg | ORAL_TABLET | Freq: Every day | ORAL | 1 refills | Status: DC

## 2016-04-02 MED ORDER — LISINOPRIL 40 MG PO TABS: 40.0000 mg | ORAL_TABLET | Freq: Every day | ORAL | 1 refills | Status: DC

## 2016-04-02 MED ORDER — METOPROLOL TARTRATE 25 MG PO TABS: 25.0000 mg | ORAL_TABLET | Freq: Two times a day (BID) | ORAL | 1 refills | Status: DC

## 2016-04-02 NOTE — Patient Instructions (Addendum)
Preventative Services:  Health Risk Assessment and Personalized Prevention Plan: Done today Bone Mass Measurements: N/A CVD Screening: Done today Colon Cancer Screening: Up to date Depression Screening: Done today Diabetes Screening: Done today Glaucoma Screening: See your eye doctor Hepatitis B vaccine: N/A Hepatitis C screening: Done previously HIV Screening: Done previously Flu Vaccine: Done today Lung cancer Screening: Ordered today Obesity Screening: Done today Pneumonia Vaccines (2): Up to date STI Screening: N/A PSA screening: Done today Health Maintenance, Male A healthy lifestyle and preventative care can promote health and wellness.  Maintain regular health, dental, and eye exams.  Eat a healthy diet. Foods like vegetables, fruits, whole grains, low-fat dairy products, and lean protein foods contain the nutrients you need and are low in calories. Decrease your intake of foods high in solid fats, added sugars, and salt. Get information about a proper diet from your health care provider, if necessary.  Regular physical exercise is one of the most important things you can do for your health. Most adults should get at least 150 minutes of moderate-intensity exercise (any activity that increases your heart rate and causes you to sweat) each week. In addition, most adults need muscle-strengthening exercises on 2 or more days a week.   Maintain a healthy weight. The body mass index (BMI) is a screening tool to identify possible weight problems. It provides an estimate of body fat based on height and weight. Your health care provider can find your BMI and can help you achieve or maintain a healthy weight. For males 20 years and older:  A BMI below 18.5 is considered underweight.  A BMI of 18.5 to 24.9 is normal.  A BMI of 25 to 29.9 is considered overweight.  A BMI of 30 and above is considered obese.  Maintain normal blood lipids and cholesterol by exercising and minimizing  your intake of saturated fat. Eat a balanced diet with plenty of fruits and vegetables. Blood tests for lipids and cholesterol should begin at age 41 and be repeated every 5 years. If your lipid or cholesterol levels are high, you are over age 37, or you are at high risk for heart disease, you may need your cholesterol levels checked more frequently.Ongoing high lipid and cholesterol levels should be treated with medicines if diet and exercise are not working.  If you smoke, find out from your health care provider how to quit. If you do not use tobacco, do not start.  Lung cancer screening is recommended for adults aged 52-80 years who are at high risk for developing lung cancer because of a history of smoking. A yearly low-dose CT scan of the lungs is recommended for people who have at least a 30-pack-year history of smoking and are current smokers or have quit within the past 15 years. A pack year of smoking is smoking an average of 1 pack of cigarettes a day for 1 year (for example, a 30-pack-year history of smoking could mean smoking 1 pack a day for 30 years or 2 packs a day for 15 years). Yearly screening should continue until the smoker has stopped smoking for at least 15 years. Yearly screening should be stopped for people who develop a health problem that would prevent them from having lung cancer treatment.  If you choose to drink alcohol, do not have more than 2 drinks per day. One drink is considered to be 12 oz (360 mL) of beer, 5 oz (150 mL) of wine, or 1.5 oz (45 mL) of liquor.  Avoid the use of street drugs. Do not share needles with anyone. Ask for help if you need support or instructions about stopping the use of drugs.  High blood pressure causes heart disease and increases the risk of stroke. High blood pressure is more likely to develop in:  People who have blood pressure in the end of the normal range (100-139/85-89 mm Hg).  People who are overweight or obese.  People who are  African American.  If you are 24-65 years of age, have your blood pressure checked every 3-5 years. If you are 47 years of age or older, have your blood pressure checked every year. You should have your blood pressure measured twice-once when you are at a hospital or clinic, and once when you are not at a hospital or clinic. Record the average of the two measurements. To check your blood pressure when you are not at a hospital or clinic, you can use:  An automated blood pressure machine at a pharmacy.  A home blood pressure monitor.  If you are 34-96 years old, ask your health care provider if you should take aspirin to prevent heart disease.  Diabetes screening involves taking a blood sample to check your fasting blood sugar level. This should be done once every 3 years after age 15 if you are at a normal weight and without risk factors for diabetes. Testing should be considered at a younger age or be carried out more frequently if you are overweight and have at least 1 risk factor for diabetes.  Colorectal cancer can be detected and often prevented. Most routine colorectal cancer screening begins at the age of 27 and continues through age 61. However, your health care provider may recommend screening at an earlier age if you have risk factors for colon cancer. On a yearly basis, your health care provider may provide home test kits to check for hidden blood in the stool. A small camera at the end of a tube may be used to directly examine the colon (sigmoidoscopy or colonoscopy) to detect the earliest forms of colorectal cancer. Talk to your health care provider about this at age 16 when routine screening begins. A direct exam of the colon should be repeated every 5-10 years through age 36, unless early forms of precancerous polyps or small growths are found.  People who are at an increased risk for hepatitis B should be screened for this virus. You are considered at high risk for hepatitis B  if:  You were born in a country where hepatitis B occurs often. Talk with your health care provider about which countries are considered high risk.  Your parents were born in a high-risk country and you have not received a shot to protect against hepatitis B (hepatitis B vaccine).  You have HIV or AIDS.  You use needles to inject street drugs.  You live with, or have sex with, someone who has hepatitis B.  You are a man who has sex with other men (MSM).  You get hemodialysis treatment.  You take certain medicines for conditions like cancer, organ transplantation, and autoimmune conditions.  Hepatitis C blood testing is recommended for all people born from [birth year] through 1966 and any individual with known risk factors for hepatitis C.  Healthy men should no longer receive prostate-specific antigen (PSA) blood tests as part of routine cancer screening. Talk to your health care provider about prostate cancer screening.  Testicular cancer screening is not recommended for adolescents or adult males who  have no symptoms. Screening includes self-exam, a health care provider exam, and other screening tests. Consult with your health care provider about any symptoms you have or any concerns you have about testicular cancer.  Practice safe sex. Use condoms and avoid high-risk sexual practices to reduce the spread of sexually transmitted infections (STIs).  You should be screened for STIs, including gonorrhea and chlamydia if:  You are sexually active and are younger than 24 years.  You are older than 24 years, and your health care provider tells you that you are at risk for this type of infection.  Your sexual activity has changed since you were last screened, and you are at an increased risk for chlamydia or gonorrhea. Ask your health care provider if you are at risk.  If you are at risk of being infected with HIV, it is recommended that you take a prescription medicine daily to prevent HIV  infection. This is called pre-exposure prophylaxis (PrEP). You are considered at risk if:  You are a man who has sex with other men (MSM).  You are a heterosexual man who is sexually active with multiple partners.  You take drugs by injection.  You are sexually active with a partner who has HIV.  Talk with your health care provider about whether you are at high risk of being infected with HIV. If you choose to begin PrEP, you should first be tested for HIV. You should then be tested every 3 months for as long as you are taking PrEP.  Use sunscreen. Apply sunscreen liberally and repeatedly throughout the day. You should seek shade when your shadow is shorter than you. Protect yourself by wearing long sleeves, pants, a wide-brimmed hat, and sunglasses year round whenever you are outdoors.  Tell your health care provider of new moles or changes in moles, especially if there is a change in shape or color. Also, tell your health care provider if a mole is larger than the size of a pencil eraser.  A one-time screening for abdominal aortic aneurysm (AAA) and surgical repair of large AAAs by ultrasound is recommended for men aged 88-75 years who are current or former smokers.  Stay current with your vaccines (immunizations). This information is not intended to replace advice given to you by your health care provider. Make sure you discuss any questions you have with your health care provider. Document Released: 11/02/2007 Document Revised: 05/27/2014 Document Reviewed: 02/07/2015 Elsevier Interactive Patient Education  2017 [employer]. Influenza (Flu) Vaccine (Inactivated or Recombinant): What You Need to Know 1. Why get vaccinated? Influenza ("flu") is a contagious disease that spreads around the [place] every year, usually between October and May. Flu is caused by influenza viruses, and is spread mainly by coughing, sneezing, and close contact. Anyone can get flu. Flu strikes suddenly  and can last several days. Symptoms vary by age, but can include:  fever/chills  sore throat  muscle aches  fatigue  cough  headache  runny or stuffy nose Flu can also lead to pneumonia and blood infections, and cause diarrhea and seizures in children. If you have a medical condition, such as heart or lung disease, flu can make it worse. Flu is more dangerous for some people. Infants and young children, people 57 years of age and older, pregnant women, and people with certain health conditions or a weakened immune system are at greatest risk. Each year thousands of people in the [place] die from flu, and many more are hospitalized. Flu  vaccine can:  keep you from getting flu,  make flu less severe if you do get it, and  keep you from spreading flu to your family and other people. 2. Inactivated and recombinant flu vaccines A dose of flu vaccine is recommended every flu season. Children 6 months through 73 years of age may need two doses during the same flu season. Everyone else needs only one dose each flu season. Some inactivated flu vaccines contain a very small amount of a mercury-based preservative called thimerosal. Studies have not shown thimerosal in vaccines to be harmful, but flu vaccines that do not contain thimerosal are available. There is no live flu virus in flu shots. They cannot cause the flu. There are many flu viruses, and they are always changing. Each year a new flu vaccine is made to protect against three or four viruses that are likely to cause disease in the upcoming flu season. But even when the vaccine doesn't exactly match these viruses, it may still provide some protection. Flu vaccine cannot prevent:  flu that is caused by a virus not covered by the vaccine, or  illnesses that look like flu but are not. It takes about 2 weeks for protection to develop after vaccination, and protection lasts through the flu season. 3. Some people should not get this  vaccine Tell the person who is giving you the vaccine:  If you have any severe, life-threatening allergies. If you ever had a life-threatening allergic reaction after a dose of flu vaccine, or have a severe allergy to any part of this vaccine, you may be advised not to get vaccinated. Most, but not all, types of flu vaccine contain a small amount of egg protein.  If you ever had Guillain-Barr Syndrome (also called GBS). Some people with a history of GBS should not get this vaccine. This should be discussed with your doctor.  If you are not feeling well. It is usually okay to get flu vaccine when you have a mild illness, but you might be asked to come back when you feel better. 4. Risks of a vaccine reaction With any medicine, including vaccines, there is a chance of reactions. These are usually mild and go away on their own, but serious reactions are also possible. Most people who get a flu shot do not have any problems with it. Minor problems following a flu shot include:  soreness, redness, or swelling where the shot was given  hoarseness  sore, red or itchy eyes  cough  fever  aches  headache  itching  fatigue If these problems occur, they usually begin soon after the shot and last 1 or 2 days. More serious problems following a flu shot can include the following:  There may be a small increased risk of Guillain-Barre Syndrome (GBS) after inactivated flu vaccine. This risk has been estimated at 1 or 2 additional cases per million people vaccinated. This is much lower than the risk of severe complications from flu, which can be prevented by flu vaccine.  Young children who get the flu shot along with pneumococcal vaccine (PCV13) and/or DTaP vaccine at the same time might be slightly more likely to have a seizure caused by fever. Ask your doctor for more information. Tell your doctor if a child who is getting flu vaccine has ever had a seizure. Problems that could happen after  any injected vaccine:  People sometimes faint after a medical procedure, including vaccination. Sitting or lying down for about 15 minutes can help  prevent fainting, and injuries caused by a fall. Tell your doctor if you feel dizzy, or have vision changes or ringing in the ears.  Some people get severe pain in the shoulder and have difficulty moving the arm where a shot was given. This happens very rarely.  Any medication can cause a severe allergic reaction. Such reactions from a vaccine are very rare, estimated at about 1 in a million doses, and would happen within a few minutes to a few hours after the vaccination. As with any medicine, there is a very remote chance of a vaccine causing a serious injury or death. The safety of vaccines is always being monitored. For more information, visit: http://www.aguilar.org/ 5. What if there is a serious reaction? What should I look for? Look for anything that concerns you, such as signs of a severe allergic reaction, very high fever, or unusual behavior. Signs of a severe allergic reaction can include hives, swelling of the face and throat, difficulty breathing, a fast heartbeat, dizziness, and weakness. These would start a few minutes to a few hours after the vaccination. What should I do?  If you think it is a severe allergic reaction or other emergency that can't wait, call 9-1-1 and get the person to the nearest hospital. Otherwise, call your doctor.  Reactions should be reported to the Vaccine Adverse Event Reporting System (VAERS). Your doctor should file this report, or you can do it yourself through the VAERS web site at www.vaers.SamedayNews.es, or by calling 563-644-9420.  VAERS does not give medical advice. 6. The National Vaccine Injury Compensation Program The [employer] Vaccine Injury Compensation Program (VICP) is a federal program that was created to compensate people who may have been injured by certain vaccines. Persons who believe they  may have been injured by a vaccine can learn about the program and about filing a claim by calling 317-825-2005 or visiting the [employer] website at GoldCloset.com.ee. There is a time limit to file a claim for compensation. 7. How can I learn more?  Ask your healthcare provider. He or she can give you the vaccine package insert or suggest other sources of information.  Call your local or state health department.  Contact the Centers for Disease Control and Prevention (CDC):  Call (856)075-7804 (1-800-CDC-INFO) or  Visit CDC's website at https://gibson.com/ Vaccine Information Statement, Inactivated Influenza Vaccine (12/24/2013) This information is not intended to replace advice given to you by your health care provider. Make sure you discuss any questions you have with your health care provider. Document Released: 02/28/2006 Document Revised: 01/25/2016 Document Reviewed: 01/25/2016 Elsevier Interactive Patient Education  2017 [place] Directive Advance directives are the legal documents that allow you to make choices about your health care and medical treatment if you cannot speak for yourself. Advance directives are a way for you to communicate your wishes to family, friends, and health care providers. The specified people can then convey your decisions about end-of-life care to avoid confusion if you should become unable to communicate. Ideally, the process of discussing and writing advance directives should happen over time rather than making decisions all at once. Advance directives can be modified as your situation changes, and you can change your mind at any time, even after you have signed the advance directives. Each state has its own laws regarding advance directives. You may want to check with your health care provider, attorney, or state representative about the law in your state. Below are some examples of advance directives. HEALTH CARE PROXY  AND DURABLE  POWER OF ATTORNEY FOR HEALTH CARE A health care proxy is a person (agent) appointed to make medical decisions for you if you cannot. Generally, people choose someone they know well and trust to represent their preferences when they can no longer do so. You should be sure to ask this person for agreement to act as your agent. An agent may have to exercise judgment in the event of a medical decision for which your wishes are not known. A durable power of attorney for health care is a legal document that names your health care proxy. Depending on the laws in your state, after the document is written, it may also need to be:  Signed.  Notarized.  Dated.  Copied.  Witnessed.  Incorporated into your medical record. You may also want to appoint someone to manage your financial affairs if you cannot. This is called a durable power of attorney for finances. It is a separate legal document from the durable power of attorney for health care. You may choose the same person or someone different from your health care proxy to act as your agent in financial matters. LIVING WILL A living will is a set of instructions documenting your wishes about medical care when you cannot care for yourself. It is used if you become:  Terminally ill.  Incapacitated.  Unable to communicate.  Unable to make decisions. Items to consider in your living will include:  The use or non-use of life-sustaining equipment, such as dialysis machines and breathing machines (ventilators).  A do not resuscitate (DNR) order, which is the instruction not to use cardiopulmonary resuscitation (CPR) if breathing or heartbeat stops.  Tube feeding.  Withholding of food and fluids.  Comfort (palliative) care when the goal becomes comfort rather than a cure.  Organ and tissue donation. A living will does not give instructions about distribution of your money and property if you should pass away. It is advisable to seek the expert  advice of a lawyer in drawing up a will regarding your possessions. Decisions about taxes, beneficiaries, and asset distribution will be legally binding. This process can relieve your family and friends of any burdens surrounding disputes or questions that may come up about the allocation of your assets. DO NOT RESUSCITATE (DNR) A do not resuscitate (DNR) order is a request to not have CPR in the event that your heart stops beating or you stop breathing. Unless given other instructions, a health care provider will try to help any patient whose heart has stopped or who has stopped breathing.  This information is not intended to replace advice given to you by your health care provider. Make sure you discuss any questions you have with your health care provider. Document Released: 08/13/2007 Document Revised: 08/28/2015 Document Reviewed: 09/23/2012 Elsevier Interactive Patient Education  2017 [employer].

## 2016-04-02 NOTE — Assessment & Plan Note (Signed)
Rechecking labs. Continue to monitor.

## 2016-04-02 NOTE — Assessment & Plan Note (Signed)
Message sent to [facility], sounds like he's a candidate for lung cancer screening. Would like to start chantix. Rx given. Recheck 1 month.

## 2016-04-02 NOTE — Assessment & Plan Note (Signed)
Stable. Continue current regimen. Call with any concerns.  

## 2016-04-02 NOTE — Progress Notes (Signed)
BP (!) 119/55   Pulse (!) 58   Temp 97.5 F (36.4 C)   Ht 5' 4.3" (1.633 m)   Wt 192 lb 12.8 oz (87.5 kg)   SpO2 100%   BMI 32.79 kg/m    Subjective:    Patient ID: Mike [name]., male    DOB: 04-29-1956, 60 y.o.   MRN: [id number]  HPI: [name] [name] is a 60 y.o. male presenting on 04/02/2016 for comprehensive medical examination. Current medical complaints include:  HYPERTENSION / HYPERLIPIDEMIA Satisfied with current treatment? yes Duration of hypertension: chronic BP monitoring frequency: not checking BP medication side effects: yes Duration of hyperlipidemia: chronic Cholesterol medication side effects: no Cholesterol supplements: none Medication compliance: excellent compliance Aspirin: yes Recent stressors: no Recurrent headaches: no Visual changes: no Palpitations: no Dyspnea: no Chest pain: no Lower extremity edema: no Dizzy/lightheaded: no  BPH BPH status: stable Satisfied with current treatment?: yes Medication side effects: no Medication compliance: excellent compliance Duration: chronic Nocturia: 1-2x per night Urinary frequency:yes Incomplete voiding: yes Urgency: no Weak urinary stream: no Straining to start stream: yes Dysuria: no Onset: gradual Severity: mild Treatments attempted: flomax  He currently lives with: girlfriend Interim Problems from his last visit: no  Functional Status Survey: Is the patient deaf or have difficulty hearing?: No Does the patient have difficulty seeing, even when wearing glasses/contacts?: No Does the patient have difficulty concentrating, remembering, or making decisions?: No Does the patient have difficulty walking or climbing stairs?: Yes Does the patient have difficulty dressing or bathing?: No Does the patient have difficulty doing errands alone such as visiting a doctor's office or shopping?: No  FALL RISK: Fall Risk  03/20/2015  Falls in the past year? No    Depression  Screen Depression screen [facility] 2/9 04/02/2016 03/20/2015  Decreased Interest 0 0  Down, Depressed, Hopeless 0 0  PHQ - 2 Score 0 0  Altered sleeping 1 -  Tired, decreased energy 0 -  Change in appetite 0 -  Feeling bad or failure about yourself  0 -  Trouble concentrating 0 -  Moving slowly or fidgety/restless 0 -  Suicidal thoughts 0 -  PHQ-9 Score 1 -   Advance Directives See Appropriate area of chart  Past Medical History:  Past Medical History:  Diagnosis Date  . Arthritis   . Benign enlargement of prostate   . CAD (coronary artery disease)   . Chronic kidney disease   . History of seizures as a child    Due to head injury  . Hyperlipidemia   . Hypertension   . Lumbago   . Myocardial infarction   . Urinary hesitancy     Surgical History:  Past Surgical History:  Procedure Laterality Date  . coronary artery stent      Medications:  Current Outpatient Prescriptions on File Prior to Visit  Medication Sig  . aspirin EC 81 MG tablet Take 81 mg by mouth daily.   Marland Kitchen EPINEPHrine (EPIPEN 2-PAK) 0.3 mg/0.3 mL IJ SOAJ injection Inject into the muscle once.   No current facility-administered medications on file prior to visit.     Allergies:  Allergies  Allergen Reactions  . Shellfish Allergy Anaphylaxis  . Peanuts [Peanut Oil] Rash  . Geralyn Flash [Fish Allergy] Hives    Social History:  Social History   Social History  . Marital status: Single    Spouse name: N/A  . Number of children: N/A  . Years of education: N/A   Occupational  History  . Not on file.   Social History Main Topics  . Smoking status: Current Every Day Smoker    Packs/day: 0.50    Types: Cigarettes  . Smokeless tobacco: Never Used     Comment: since 16, max 1 ppd x10  . Alcohol use No  . Drug use: No  . Sexual activity: Yes    Birth control/ protection: None   Other Topics Concern  . Not on file   Social History Narrative  . No narrative on file   History  Smoking Status  .  Current Every Day Smoker  . Packs/day: 0.50  . Types: Cigarettes  Smokeless Tobacco  . Never Used    Comment: since 15, max 1 ppd x10   History  Alcohol Use No    Family History:  Family History  Problem Relation Age of Onset  . Cancer Mother     breast  . Hypertension Mother   . Hypertension Father   . Stroke Father   . Stroke Maternal Grandfather     Past medical history, surgical history, medications, allergies, family history and social history reviewed with patient today and changes made to appropriate areas of the chart.   Review of Systems  Constitutional: Negative.   HENT: Negative.   Eyes: Negative.   Respiratory: Negative.   Cardiovascular: Negative.   Gastrointestinal: Negative.   Genitourinary: Negative.   Musculoskeletal: Negative.        Sciatica on R side, arthritis in his L knuckles  Skin: Negative.   Neurological: Negative.   Endo/Heme/Allergies: Negative.   Psychiatric/Behavioral: Negative.    All other ROS negative except what is listed above and in the HPI.      Objective:    BP (!) 119/55   Pulse (!) 58   Temp 97.5 F (36.4 C)   Ht 5' 4.3" (1.633 m)   Wt 192 lb 12.8 oz (87.5 kg)   SpO2 100%   BMI 32.79 kg/m   Wt Readings from Last 3 Encounters:  04/02/16 192 lb 12.8 oz (87.5 kg)  09/18/15 192 lb (87.1 kg)  03/20/15 188 lb (85.3 kg)     Hearing Screening   125Hz  250Hz  500Hz  1000Hz  2000Hz  3000Hz  4000Hz  6000Hz  8000Hz   Right ear:   20 20 20   Fail    Left ear:   20 20 20  20       Visual Acuity Screening   Right eye Left eye Both eyes  Without correction: 20/50 20/25 20/30   With correction:       Physical Exam  Constitutional: He is oriented to person, place, and time. He appears well-developed and well-nourished. No distress.  HENT:  Head: Normocephalic and atraumatic.  Right Ear: Hearing and external ear normal.  Left Ear: Hearing and external ear normal.  Nose: Nose normal.  Mouth/Throat: Oropharynx is clear and moist. No  oropharyngeal exudate.  Eyes: Conjunctivae, EOM and lids are normal. Pupils are equal, round, and reactive to light. Right eye exhibits no discharge. Left eye exhibits no discharge. No scleral icterus.  Neck: Normal range of motion. Neck supple. No JVD present. No tracheal deviation present. No thyromegaly present.  Cardiovascular: Normal rate, regular rhythm, normal heart sounds and intact distal pulses.  Exam reveals no gallop and no friction rub.   No murmur heard. Pulmonary/Chest: Effort normal and breath sounds normal. No stridor. No respiratory distress. He has no wheezes. He has no rales. He exhibits no tenderness.  Abdominal: Soft. Bowel sounds are normal. He  exhibits no distension and no mass. There is no tenderness. There is no rebound and no guarding.  Musculoskeletal: Normal range of motion.  Lymphadenopathy:    He has no cervical adenopathy.  Neurological: He is alert and oriented to person, place, and time.  Skin: Skin is intact. No rash noted. He is not diaphoretic.  Psychiatric: He has a normal mood and affect. His speech is normal and behavior is normal. Judgment and thought content normal. Cognition and memory are normal.  Nursing note and vitals reviewed.   6CIT Screen 04/02/2016  What Year? 0 points  What month? 0 points  What time? 0 points  Count back from 20 0 points  Months in reverse 0 points  Repeat phrase 2 points  Total Score 2    Results for orders placed or performed in visit on 09/18/15  Lipid Panel Piccolo, [employer]  Result Value Ref Range   Cholesterol Piccolo, Waived 138 <200 mg/dL   HDL Chol Piccolo, Waived 63 >59 mg/dL   Triglycerides Piccolo,Waived 54 <150 mg/dL   Chol/HDL Ratio Piccolo,Waive 2.2 mg/dL   LDL Chol Calc Piccolo Waived 64 <100 mg/dL   VLDL Chol Calc Piccolo,Waive 11 <30 mg/dL  Comprehensive metabolic panel  Result Value Ref Range   Glucose 75 65 - 99 mg/dL   BUN 14 8 - 27 mg/dL   Creatinine, Ser 1.40 (H) 0.76 - 1.27 mg/dL   GFR  calc non Af Amer 54 (L) >59 mL/min/1.73   GFR calc Af Amer 62 >59 mL/min/1.73   BUN/Creatinine Ratio 10 10 - 24   Sodium 143 134 - 144 mmol/L   Potassium 4.1 3.5 - 5.2 mmol/L   Chloride 105 96 - 106 mmol/L   CO2 25 18 - 29 mmol/L   Calcium 9.7 8.6 - 10.2 mg/dL   Total Protein 6.5 6.0 - 8.5 g/dL   Albumin 4.1 3.6 - 4.8 g/dL   Globulin, Total 2.4 1.5 - 4.5 g/dL   Albumin/Globulin Ratio 1.7 1.2 - 2.2   Bilirubin Total 0.5 0.0 - 1.2 mg/dL   Alkaline Phosphatase 125 (H) 39 - 117 IU/L   AST 20 0 - 40 IU/L   ALT 13 0 - 44 IU/L      Assessment & Plan:   Problem List Items Addressed This Visit      Cardiovascular and Mediastinum   CAD (coronary artery disease)    No pain. Continue medical management. Call with any concerns.       Relevant Medications   simvastatin (ZOCOR) 40 MG tablet   metoprolol tartrate (LOPRESSOR) 25 MG tablet   lisinopril (PRINIVIL,ZESTRIL) 40 MG tablet     Genitourinary   BPH (benign prostatic hyperplasia)    Stable. Continue current regimen. Call with any concerns.       Relevant Medications   tamsulosin (FLOMAX) 0.4 MG CAPS capsule   Benign hypertensive renal disease    Under good control on recheck. Call with any concerns.       CKD (chronic kidney disease), stage III    Rechecking labs. Continue to monitor.         Other   HLD (hyperlipidemia)    Under good control. Continue current regimen. Continue to monitor.       Relevant Medications   simvastatin (ZOCOR) 40 MG tablet   metoprolol tartrate (LOPRESSOR) 25 MG tablet   lisinopril (PRINIVIL,ZESTRIL) 40 MG tablet   Tobacco abuse    Message sent to [employer], sounds like he's a candidate for lung  cancer screening. Would like to start chantix. Rx given. Recheck 1 month.       Other Visit Diagnoses    Medicare annual wellness visit, subsequent    -  Primary   Preventative care discussed as below. Vaccines up to date. Labs done. Continue diet and exercise.    Immunization due        Flu shot given today   Relevant Orders   Flu Vaccine QUAD 36+ mos PF IM (Fluarix & Fluzone Quad PF) (Completed)       Preventative Services:  Health Risk Assessment and Personalized Prevention Plan: Done today Bone Mass Measurements: N/A CVD Screening: Done today Colon Cancer Screening: Up to date Depression Screening: Done today Diabetes Screening: Done today Glaucoma Screening: See your eye doctor Hepatitis B vaccine: N/A Hepatitis C screening: Done previously HIV Screening: Done previously Flu Vaccine: Done today Lung cancer Screening: Ordered today Obesity Screening: Done today Pneumonia Vaccines (2): Up to date STI Screening: N/A PSA screening: Done today  Discussed aspirin prophylaxis for myocardial infarction prevention and decision was made to continue ASA  LABORATORY TESTING:  Health maintenance labs ordered today as discussed above.   The natural history of prostate cancer and ongoing controversy regarding screening and potential treatment outcomes of prostate cancer has been discussed with the patient. The meaning of a false positive PSA and a false negative PSA has been discussed. He indicates understanding of the limitations of this screening test and wishes to proceed with screening PSA testing.  IMMUNIZATIONS:   - Tdap: Tetanus vaccination status reviewed: last tetanus booster within 10 years. - Influenza: Administered today - Pneumovax: Up to date - Prevnar: Not applicable - Zostavax vaccine: Up to date  SCREENING: - Colonoscopy: Up to date  Discussed with patient purpose of the colonoscopy is to detect colon cancer at curable precancerous or early stages   PATIENT COUNSELING:    Sexuality: Discussed sexually transmitted diseases, partner selection, use of condoms, avoidance of unintended pregnancy  and contraceptive alternatives.   Advised to avoid cigarette smoking.  I discussed with the patient that most people either abstain from alcohol or drink  within safe limits (<=14/week and <=4 drinks/occasion for males, <=7/weeks and <= 3 drinks/occasion for females) and that the risk for alcohol disorders and other health effects rises proportionally with the number of drinks per week and how often a drinker exceeds daily limits.  Discussed cessation/primary prevention of drug use and availability of treatment for abuse.   Diet: Encouraged to adjust caloric intake to maintain  or achieve ideal body weight, to reduce intake of dietary saturated fat and total fat, to limit sodium intake by avoiding high sodium foods and not adding table salt, and to maintain adequate dietary potassium and calcium preferably from fresh fruits, vegetables, and low-fat dairy products.    stressed the importance of regular exercise  Injury prevention: Discussed safety belts, safety helmets, smoke detector, smoking near bedding or upholstery.   Dental health: Discussed importance of regular tooth brushing, flossing, and dental visits.   Follow up plan: NEXT PREVENTATIVE PHYSICAL DUE IN 1 YEAR. Return in about 4 weeks (around 04/30/2016) for Follow up smoking cessation.

## 2016-04-02 NOTE — Assessment & Plan Note (Signed)
Under good control. Continue current regimen. Continue to monitor.  

## 2016-04-02 NOTE — Assessment & Plan Note (Signed)
No pain. Continue medical management. Call with any concerns.

## 2016-04-02 NOTE — Assessment & Plan Note (Signed)
Under good control on recheck. Call with any concerns.  

## 2016-04-03 ENCOUNTER — Encounter: Payer: Self-pay | Admitting: Family Medicine

## 2016-04-03 LAB — CBC WITH DIFFERENTIAL/PLATELET
BASOS ABS: 0.1 10*3/uL (ref 0.0–0.2)
BASOS: 1 %
EOS (ABSOLUTE): 2.4 10*3/uL — AB (ref 0.0–0.4)
Eos: 28 %
HEMATOCRIT: 40.9 % (ref 37.5–51.0)
Hemoglobin: 13.8 g/dL (ref 12.6–17.7)
IMMATURE GRANULOCYTES: 0 %
Immature Grans (Abs): 0 10*3/uL (ref 0.0–0.1)
Lymphocytes Absolute: 2.2 10*3/uL (ref 0.7–3.1)
Lymphs: 26 %
MCH: 25.8 pg — ABNORMAL LOW (ref 26.6–33.0)
MCHC: 33.7 g/dL (ref 31.5–35.7)
MCV: 77 fL — ABNORMAL LOW (ref 79–97)
MONOS ABS: 0.5 10*3/uL (ref 0.1–0.9)
Monocytes: 6 %
NEUTROS PCT: 39 %
Neutrophils Absolute: 3.4 10*3/uL (ref 1.4–7.0)
Platelets: 185 10*3/uL (ref 150–379)
RBC: 5.34 x10E6/uL (ref 4.14–5.80)
RDW: 13.7 % (ref 12.3–15.4)
WBC: 8.5 10*3/uL (ref 3.4–10.8)

## 2016-04-03 LAB — COMPREHENSIVE METABOLIC PANEL
A/G RATIO: 1.6 (ref 1.2–2.2)
ALK PHOS: 161 IU/L — AB (ref 39–117)
ALT: 18 IU/L (ref 0–44)
AST: 27 IU/L (ref 0–40)
Albumin: 4.1 g/dL (ref 3.6–4.8)
BUN/Creatinine Ratio: 11 (ref 10–24)
BUN: 16 mg/dL (ref 8–27)
Bilirubin Total: 0.4 mg/dL (ref 0.0–1.2)
CO2: 21 mmol/L (ref 18–29)
Calcium: 9.4 mg/dL (ref 8.6–10.2)
Chloride: 101 mmol/L (ref 96–106)
Creatinine, Ser: 1.4 mg/dL — ABNORMAL HIGH (ref 0.76–1.27)
GFR calc Af Amer: 62 mL/min/{1.73_m2} (ref >59)
GFR calc non Af Amer: 54 mL/min/{1.73_m2} — ABNORMAL LOW (ref >59)
GLOBULIN, TOTAL: 2.6 g/dL (ref 1.5–4.5)
Glucose: 80 mg/dL (ref 65–99)
POTASSIUM: 4.5 mmol/L (ref 3.5–5.2)
SODIUM: 140 mmol/L (ref 134–144)
Total Protein: 6.7 g/dL (ref 6.0–8.5)

## 2016-04-03 LAB — PSA: PROSTATE SPECIFIC AG, SERUM: 0.3 ng/mL (ref 0.0–4.0)

## 2016-04-03 LAB — TSH: TSH: 1.49 u[IU]/mL (ref 0.450–4.500)

## 2016-04-16 ENCOUNTER — Telehealth: Payer: Self-pay | Admitting: *Deleted

## 2016-04-16 NOTE — Telephone Encounter (Signed)
Received referral for low dose lung cancer screening CT scan.Attempted to leave Voicemail at phone number listed in EMR for patient to call me back to facilitate scheduling scan. However, there is no voicemail option.

## 2016-04-17 ENCOUNTER — Telehealth: Payer: Self-pay | Admitting: *Deleted

## 2016-04-17 NOTE — Telephone Encounter (Signed)
Received referral for initial lung cancer screening scan. Contacted patient and obtained smoking history,(current, 30 pack year) as well as answering questions related to screening process. Patient denies signs of lung cancer such as weight loss or hemoptysis. Patient denies comorbidity that would prevent curative treatment if lung cancer were found. Patient is tentatively scheduled for shared decision making visit and CT scan on 04/23/16 at 1:30pm, pending insurance approval from business office.

## 2016-04-22 ENCOUNTER — Other Ambulatory Visit: Payer: Self-pay | Admitting: *Deleted

## 2016-04-30 ENCOUNTER — Encounter: Payer: Self-pay | Admitting: Family Medicine

## 2016-04-30 ENCOUNTER — Ambulatory Visit (INDEPENDENT_AMBULATORY_CARE_PROVIDER_SITE_OTHER): Payer: Commercial Managed Care - HMO | Admitting: Family Medicine

## 2016-04-30 DIAGNOSIS — Z720 Tobacco use: Secondary | ICD-10-CM | POA: Diagnosis not present

## 2016-04-30 NOTE — Progress Notes (Signed)
BP 120/62   Pulse (!) 52   Temp 97.6 F (36.4 C)   Wt 200 lb 3.2 oz (90.8 kg)   SpO2 100%   BMI 34.04 kg/m    Subjective:    Patient ID: Mike [name]., male    DOB: 08-23-1955, 60 y.o.   MRN: [id number]  HPI: Mike [name] Mike [name]. is a 60 y.o. male  Chief Complaint  Patient presents with  . Nicotine Dependence   SMOKING CESSATION Smoking Status: current every day smoker Smoking Amount: 1/2 pack a day Smoking starting: 60yo Smoking Quit Date: 3 weeks ago! Smoking triggers: none Type of tobacco use: cigarettes Children in the house: no Other household members who smoke: no Treatments attempted: chantix Pneumovax: up to date   Relevant past medical, surgical, family and social history reviewed and updated as indicated. Interim medical history since our last visit reviewed. Allergies and medications reviewed and updated.  Review of Systems  Constitutional: Negative.   Respiratory: Negative.   Cardiovascular: Negative.   Psychiatric/Behavioral: Negative.     Per HPI unless specifically indicated above     Objective:    BP 120/62   Pulse (!) 52   Temp 97.6 F (36.4 C)   Wt 200 lb 3.2 oz (90.8 kg)   SpO2 100%   BMI 34.04 kg/m   Wt Readings from Last 3 Encounters:  04/30/16 200 lb 3.2 oz (90.8 kg)  04/02/16 192 lb 12.8 oz (87.5 kg)  09/18/15 192 lb (87.1 kg)    Physical Exam  Constitutional: He is oriented to person, place, and time. He appears well-developed and well-nourished. No distress.  HENT:  Head: Normocephalic and atraumatic.  Right Ear: Hearing normal.  Left Ear: Hearing normal.  Nose: Nose normal.  Eyes: Conjunctivae and lids are normal. Right eye exhibits no discharge. Left eye exhibits no discharge. No scleral icterus.  Cardiovascular: Normal rate, regular rhythm, normal heart sounds and intact distal pulses.  Exam reveals no gallop and no friction rub.   No murmur heard. Pulmonary/Chest: Effort normal and breath sounds  normal. No respiratory distress. He has no wheezes. He has no rales. He exhibits no tenderness.  Musculoskeletal: Normal range of motion.  Neurological: He is alert and oriented to person, place, and time.  Skin: Skin is warm, dry and intact. No rash noted. He is not diaphoretic. No erythema. No pallor.  Psychiatric: He has a normal mood and affect. His speech is normal and behavior is normal. Judgment and thought content normal. Cognition and memory are normal.  Nursing note and vitals reviewed.   Results for orders placed or performed in visit on 04/02/16  CBC with Differential/Platelet  Result Value Ref Range   WBC 8.5 3.4 - 10.8 x10E3/uL   RBC 5.34 4.14 - 5.80 x10E6/uL   Hemoglobin 13.8 12.6 - 17.7 g/dL   Hematocrit 40.9 37.5 - 51.0 %   MCV 77 (L) 79 - 97 fL   MCH 25.8 (L) 26.6 - 33.0 pg   MCHC 33.7 31.5 - 35.7 g/dL   RDW 13.7 12.3 - 15.4 %   Platelets 185 150 - 379 x10E3/uL   Neutrophils 39 Not Estab. %   Lymphs 26 Not Estab. %   Monocytes 6 Not Estab. %   Eos 28 Not Estab. %   Basos 1 Not Estab. %   Neutrophils Absolute 3.4 1.4 - 7.0 x10E3/uL   Lymphocytes Absolute 2.2 0.7 - 3.1 x10E3/uL   Monocytes Absolute 0.5 0.1 - 0.9 x10E3/uL  EOS (ABSOLUTE) 2.4 (H) 0.0 - 0.4 x10E3/uL   Basophils Absolute 0.1 0.0 - 0.2 x10E3/uL   Immature Granulocytes 0 Not Estab. %   Immature Grans (Abs) 0.0 0.0 - 0.1 x10E3/uL   Hematology Comments: Note:   Comprehensive metabolic panel  Result Value Ref Range   Glucose 80 65 - 99 mg/dL   BUN 16 8 - 27 mg/dL   Creatinine, Ser 1.40 (H) 0.76 - 1.27 mg/dL   GFR calc non Af Amer 54 (L) >59 mL/min/1.73   GFR calc Af Amer 62 >59 mL/min/1.73   BUN/Creatinine Ratio 11 10 - 24   Sodium 140 134 - 144 mmol/L   Potassium 4.5 3.5 - 5.2 mmol/L   Chloride 101 96 - 106 mmol/L   CO2 21 18 - 29 mmol/L   Calcium 9.4 8.6 - 10.2 mg/dL   Total Protein 6.7 6.0 - 8.5 g/dL   Albumin 4.1 3.6 - 4.8 g/dL   Globulin, Total 2.6 1.5 - 4.5 g/dL   Albumin/Globulin Ratio  1.6 1.2 - 2.2   Bilirubin Total 0.4 0.0 - 1.2 mg/dL   Alkaline Phosphatase 161 (H) 39 - 117 IU/L   AST 27 0 - 40 IU/L   ALT 18 0 - 44 IU/L  Lipid Panel Piccolo, Waived  Result Value Ref Range   Cholesterol Piccolo, Waived 142 <200 mg/dL   HDL Chol Piccolo, Waived 53 (L) >59 mg/dL   Triglycerides Piccolo,Waived 53 <150 mg/dL   Chol/HDL Ratio Piccolo,Waive 2.7 mg/dL   LDL Chol Calc Piccolo Waived 78 <100 mg/dL   VLDL Chol Calc Piccolo,Waive 11 <30 mg/dL  Microalbumin, Urine Waived  Result Value Ref Range   Microalb, Ur Waived 10 0 - 19 mg/L   Creatinine, Urine Waived 200 10 - 300 mg/dL   Microalb/Creat Ratio <30 <30 mg/g  PSA  Result Value Ref Range   Prostate Specific Ag, Serum 0.3 0.0 - 4.0 ng/mL  TSH  Result Value Ref Range   TSH 1.490 0.450 - 4.500 uIU/mL  UA/M w/rflx Culture, Routine  Result Value Ref Range   Specific Gravity, UA 1.015 1.005 - 1.030   pH, UA 5.5 5.0 - 7.5   Color, UA Yellow Yellow   Appearance Ur Clear Clear   Leukocytes, UA Negative Negative   Protein, UA Negative Negative/Trace   Glucose, UA Negative Negative   Ketones, UA Negative Negative   RBC, UA Negative Negative   Bilirubin, UA Negative Negative   Urobilinogen, Ur 0.2 0.2 - 1.0 mg/dL   Nitrite, UA Negative Negative      Assessment & Plan:   Problem List Items Addressed This Visit      Other   Tobacco abuse    Hasn't smoked in 3 weeks! Congratulated patient. Encouraged continued abstinence. Continue chantix.          Follow up plan: Return in about 5 months (around 09/28/2016) for Follow up BP/Chol.

## 2016-04-30 NOTE — Assessment & Plan Note (Signed)
Hasn't smoked in 3 weeks! Congratulated patient. Encouraged continued abstinence. Continue chantix.

## 2016-05-06 ENCOUNTER — Other Ambulatory Visit: Payer: Self-pay | Admitting: *Deleted

## 2016-05-06 DIAGNOSIS — Z87891 Personal history of nicotine dependence: Secondary | ICD-10-CM

## 2016-05-07 ENCOUNTER — Ambulatory Visit
Admission: RE | Admit: 2016-05-07 | Discharge: 2016-05-07 | Disposition: A | Discharge status: Home / Self Care | Payer: Commercial Managed Care - HMO | Source: Ambulatory Visit | Attending: Oncology | Admitting: Oncology

## 2016-05-07 ENCOUNTER — Encounter: Payer: Self-pay | Admitting: Oncology

## 2016-05-07 ENCOUNTER — Inpatient Hospital Stay: Payer: Commercial Managed Care - HMO | Attending: Oncology | Admitting: Oncology

## 2016-05-07 DIAGNOSIS — Z87891 Personal history of nicotine dependence: Secondary | ICD-10-CM | POA: Diagnosis not present

## 2016-05-07 DIAGNOSIS — R918 Other nonspecific abnormal finding of lung field: Secondary | ICD-10-CM | POA: Diagnosis not present

## 2016-05-07 DIAGNOSIS — I700 Atherosclerosis of aorta: Secondary | ICD-10-CM | POA: Insufficient documentation

## 2016-05-07 DIAGNOSIS — Z122 Encounter for screening for malignant neoplasm of respiratory organs: Secondary | ICD-10-CM

## 2016-05-07 DIAGNOSIS — F17210 Nicotine dependence, cigarettes, uncomplicated: Secondary | ICD-10-CM

## 2016-05-07 DIAGNOSIS — K229 Disease of esophagus, unspecified: Secondary | ICD-10-CM | POA: Diagnosis not present

## 2016-05-07 DIAGNOSIS — I2510 Atherosclerotic heart disease of native coronary artery without angina pectoris: Secondary | ICD-10-CM | POA: Diagnosis not present

## 2016-05-09 ENCOUNTER — Telehealth: Payer: Self-pay | Admitting: *Deleted

## 2016-05-09 NOTE — Telephone Encounter (Signed)
Notified patient of LDCT lung cancer screening results with recommendation for 3 month follow up imaging. Also notified of incidental finding noted below. Patient verbalizes understanding. This note will be forwarded to patient's PCP via Epic.  IMPRESSION: 1. Masslike subpleural focus of consolidation in the posterior right lower lobe with adjacent pleural thickening and calcified pleural plaque, increased since 12/04/2010 chest CT. Although technically Lung-RADS Category 4B, suspicious, benign rounded atelectasis is suspected due to the described chest CT features, and initial follow-up in 3 months is recommended with repeat low-dose chest CT without contrast (please use the following order, "CT CHEST LCS NODULE FOLLOW-UP W/O CM"). 2. The "S" modifier above refers to potentially clinically significant non lung cancer related findings. Specifically, left main and 3 vessel coronary atherosclerosis . 3. Aortic atherosclerosis . 4. Nonspecific mild circumferential wall thickening in the lower third of the thoracic esophagus, not appreciably changed since 2012, most commonly due to reflux esophagitis.

## 2016-05-11 DIAGNOSIS — Z87891 Personal history of nicotine dependence: Secondary | ICD-10-CM | POA: Insufficient documentation

## 2016-05-11 NOTE — Progress Notes (Signed)
In accordance with CMS guidelines, patient has met eligibility criteria including age, absence of signs or symptoms of lung cancer.  Social History  Substance Use Topics  . Smoking status: Current Every Day Smoker    Packs/day: 1.00    Years: 30.00    Types: Cigarettes    Last attempt to quit: 04/12/2016  . Smokeless tobacco: Never Used     Comment: since 16, max 1 ppd x10  . Alcohol use No     A shared decision-making session was conducted prior to the performance of CT scan. This includes one or more decision aids, includes benefits and harms of screening, follow-up diagnostic testing, over-diagnosis, false positive rate, and total radiation exposure.  Counseling on the importance of adherence to annual lung cancer LDCT screening, impact of co-morbidities, and ability or willingness to undergo diagnosis and treatment is imperative for compliance of the program.  Counseling on the importance of continued smoking cessation for former smokers; the importance of smoking cessation for current smokers, and information about tobacco cessation interventions have been given to patient including [place] and [facility] quit [place] programs.  Written order for lung cancer screening with LDCT has been given to the patient and any and all questions have been answered to the best of my abilities.   Yearly follow up will be coordinated by Burgess Estelle, Thoracic Navigator.

## 2016-07-31 ENCOUNTER — Telehealth: Payer: Self-pay | Admitting: *Deleted

## 2016-07-31 DIAGNOSIS — R9389 Abnormal findings on diagnostic imaging of other specified body structures: Secondary | ICD-10-CM

## 2016-07-31 NOTE — Telephone Encounter (Signed)
Notified patient that lung cancer screening follow up CT scan is due. Confirmed that patient is within the age range of 55-77, and asymptomatic, (no signs or symptoms of lung cancer). Patient denies illness that would prevent curative treatment for lung cancer if found. The patient is a current smoker, with a 30 pack year history. The shared decision making visit was done 05/07/16. Patient is agreeable for CT scan being scheduled.

## 2016-08-01 ENCOUNTER — Telehealth: Payer: Self-pay

## 2016-08-01 NOTE — Telephone Encounter (Signed)
Spoke with patient.  Appt details given. 3/23 @ 1:30pm Mike [name].

## 2016-08-09 ENCOUNTER — Ambulatory Visit
Admission: RE | Admit: 2016-08-09 | Discharge: 2016-08-09 | Disposition: A | Discharge status: Home / Self Care | Payer: Medicare HMO | Source: Ambulatory Visit | Attending: Oncology | Admitting: Oncology

## 2016-08-09 DIAGNOSIS — I2510 Atherosclerotic heart disease of native coronary artery without angina pectoris: Secondary | ICD-10-CM | POA: Insufficient documentation

## 2016-08-09 DIAGNOSIS — R9389 Abnormal findings on diagnostic imaging of other specified body structures: Secondary | ICD-10-CM

## 2016-08-09 DIAGNOSIS — R938 Abnormal findings on diagnostic imaging of other specified body structures: Secondary | ICD-10-CM | POA: Diagnosis present

## 2016-08-09 DIAGNOSIS — R918 Other nonspecific abnormal finding of lung field: Secondary | ICD-10-CM | POA: Diagnosis not present

## 2016-08-09 DIAGNOSIS — I700 Atherosclerosis of aorta: Secondary | ICD-10-CM | POA: Diagnosis not present

## 2016-08-12 ENCOUNTER — Encounter: Payer: Self-pay | Admitting: Family Medicine

## 2016-08-12 ENCOUNTER — Encounter: Payer: Self-pay | Admitting: *Deleted

## 2016-08-12 DIAGNOSIS — J449 Chronic obstructive pulmonary disease, unspecified: Secondary | ICD-10-CM | POA: Insufficient documentation

## 2016-10-01 ENCOUNTER — Ambulatory Visit (INDEPENDENT_AMBULATORY_CARE_PROVIDER_SITE_OTHER): Payer: Medicare HMO | Admitting: Family Medicine

## 2016-10-01 ENCOUNTER — Encounter: Payer: Self-pay | Admitting: Family Medicine

## 2016-10-01 VITALS — BP 136/70 | HR 56 | Temp 99.0°F | Wt 199.8 lb

## 2016-10-01 DIAGNOSIS — I129 Hypertensive chronic kidney disease with stage 1 through stage 4 chronic kidney disease, or unspecified chronic kidney disease: Secondary | ICD-10-CM

## 2016-10-01 DIAGNOSIS — E782 Mixed hyperlipidemia: Secondary | ICD-10-CM

## 2016-10-01 LAB — MICROALBUMIN, URINE WAIVED
Creatinine, Urine Waived: 100 mg/dL (ref 10–300)
Microalb, Ur Waived: 10 mg/L (ref 0–19)

## 2016-10-01 MED ORDER — TAMSULOSIN HCL 0.4 MG PO CAPS: 0.4000 mg | ORAL_CAPSULE | Freq: Every day | ORAL | 1 refills | Status: DC

## 2016-10-01 MED ORDER — SIMVASTATIN 40 MG PO TABS: 40.0000 mg | ORAL_TABLET | Freq: Every day | ORAL | 1 refills | Status: DC

## 2016-10-01 MED ORDER — METOPROLOL TARTRATE 25 MG PO TABS: 25.0000 mg | ORAL_TABLET | Freq: Two times a day (BID) | ORAL | 1 refills | Status: DC

## 2016-10-01 MED ORDER — LISINOPRIL 40 MG PO TABS: 40.0000 mg | ORAL_TABLET | Freq: Every day | ORAL | 1 refills | Status: DC

## 2016-10-01 NOTE — Assessment & Plan Note (Signed)
Under good control. Continue regimen. Continue to monitor. Call with any concerns.

## 2016-10-01 NOTE — Progress Notes (Signed)
BP 136/70 (BP Location: Left Arm, Patient Position: Sitting, Cuff Size: Large)   Pulse (!) 56   Temp 99 F (37.2 C)   Wt 199 lb 12.8 oz (90.6 kg)   SpO2 98%   BMI 33.25 kg/m    Subjective:    Patient ID: Mike [name]., male    DOB: 1955-07-11, 61 y.o.   MRN: [id number]  HPI: Cheryl Stabenow Flicker [name] is a 61 y.o. male  Chief Complaint  Patient presents with  . Hypertension  . Hyperlipidemia   Has quit smoking! Has been doing well!  HYPERTENSION / HYPERLIPIDEMIA Satisfied with current treatment? yes Duration of hypertension: chronic BP monitoring frequency: not checking BP medication side effects: no Past BP meds: lisinopril, metoprolol Duration of hyperlipidemia: chronic Cholesterol medication side effects: no Cholesterol supplements: none Past cholesterol medications: simvastatin Medication compliance: excellent compliance Aspirin: yes Recent stressors: no Recurrent headaches: no Visual changes: no Palpitations: no Dyspnea: no Chest pain: no Lower extremity edema: no Dizzy/lightheaded: no  Relevant past medical, surgical, family and social history reviewed and updated as indicated. Interim medical history since our last visit reviewed. Allergies and medications reviewed and updated.  Review of Systems  Constitutional: Negative.   Respiratory: Negative.   Cardiovascular: Negative.   Psychiatric/Behavioral: Negative.     Per HPI unless specifically indicated above     Objective:    BP 136/70 (BP Location: Left Arm, Patient Position: Sitting, Cuff Size: Large)   Pulse (!) 56   Temp 99 F (37.2 C)   Wt 199 lb 12.8 oz (90.6 kg)   SpO2 98%   BMI 33.25 kg/m   Wt Readings from Last 3 Encounters:  10/01/16 199 lb 12.8 oz (90.6 kg)  05/07/16 200 lb (90.7 kg)  04/30/16 200 lb 3.2 oz (90.8 kg)    Physical Exam  Constitutional: He is oriented to person, place, and time. He appears well-developed and well-nourished. No distress.  HENT:    Head: Normocephalic and atraumatic.  Right Ear: Hearing normal.  Left Ear: Hearing normal.  Nose: Nose normal.  Eyes: Conjunctivae and lids are normal. Right eye exhibits no discharge. Left eye exhibits no discharge. No scleral icterus.  Cardiovascular: Normal rate, regular rhythm, normal heart sounds and intact distal pulses.  Exam reveals no gallop and no friction rub.   No murmur heard. Pulmonary/Chest: Effort normal and breath sounds normal. No respiratory distress. He has no wheezes. He has no rales. He exhibits no tenderness.  Musculoskeletal: Normal range of motion.  Neurological: He is alert and oriented to person, place, and time.  Skin: Skin is warm, dry and intact. No rash noted. He is not diaphoretic. No erythema. No pallor.  Psychiatric: He has a normal mood and affect. His speech is normal and behavior is normal. Judgment and thought content normal. Cognition and memory are normal.  Nursing note and vitals reviewed.   Results for orders placed or performed in visit on 04/02/16  CBC with Differential/Platelet  Result Value Ref Range   WBC 8.5 3.4 - 10.8 x10E3/uL   RBC 5.34 4.14 - 5.80 x10E6/uL   Hemoglobin 13.8 12.6 - 17.7 g/dL   Hematocrit 40.9 37.5 - 51.0 %   MCV 77 (L) 79 - 97 fL   MCH 25.8 (L) 26.6 - 33.0 pg   MCHC 33.7 31.5 - 35.7 g/dL   RDW 13.7 12.3 - 15.4 %   Platelets 185 150 - 379 x10E3/uL   Neutrophils 39 Not Estab. %   Lymphs 26  Not Estab. %   Monocytes 6 Not Estab. %   Eos 28 Not Estab. %   Basos 1 Not Estab. %   Neutrophils Absolute 3.4 1.4 - 7.0 x10E3/uL   Lymphocytes Absolute 2.2 0.7 - 3.1 x10E3/uL   Monocytes Absolute 0.5 0.1 - 0.9 x10E3/uL   EOS (ABSOLUTE) 2.4 (H) 0.0 - 0.4 x10E3/uL   Basophils Absolute 0.1 0.0 - 0.2 x10E3/uL   Immature Granulocytes 0 Not Estab. %   Immature Grans (Abs) 0.0 0.0 - 0.1 x10E3/uL   Hematology Comments: Note:   Comprehensive metabolic panel  Result Value Ref Range   Glucose 80 65 - 99 mg/dL   BUN 16 8 - 27 mg/dL    Creatinine, Ser 1.40 (H) 0.76 - 1.27 mg/dL   GFR calc non Af Amer 54 (L) >59 mL/min/1.73   GFR calc Af Amer 62 >59 mL/min/1.73   BUN/Creatinine Ratio 11 10 - 24   Sodium 140 134 - 144 mmol/L   Potassium 4.5 3.5 - 5.2 mmol/L   Chloride 101 96 - 106 mmol/L   CO2 21 18 - 29 mmol/L   Calcium 9.4 8.6 - 10.2 mg/dL   Total Protein 6.7 6.0 - 8.5 g/dL   Albumin 4.1 3.6 - 4.8 g/dL   Globulin, Total 2.6 1.5 - 4.5 g/dL   Albumin/Globulin Ratio 1.6 1.2 - 2.2   Bilirubin Total 0.4 0.0 - 1.2 mg/dL   Alkaline Phosphatase 161 (H) 39 - 117 IU/L   AST 27 0 - 40 IU/L   ALT 18 0 - 44 IU/L  Lipid Panel Piccolo, Waived  Result Value Ref Range   Cholesterol Piccolo, Waived 142 <200 mg/dL   HDL Chol Piccolo, Waived 53 (L) >59 mg/dL   Triglycerides Piccolo,Waived 53 <150 mg/dL   Chol/HDL Ratio Piccolo,Waive 2.7 mg/dL   LDL Chol Calc Piccolo Waived 78 <100 mg/dL   VLDL Chol Calc Piccolo,Waive 11 <30 mg/dL  Microalbumin, Urine Waived  Result Value Ref Range   Microalb, Ur Waived 10 0 - 19 mg/L   Creatinine, Urine Waived 200 10 - 300 mg/dL   Microalb/Creat Ratio <30 <30 mg/g  PSA  Result Value Ref Range   Prostate Specific Ag, Serum 0.3 0.0 - 4.0 ng/mL  TSH  Result Value Ref Range   TSH 1.490 0.450 - 4.500 uIU/mL  UA/M w/rflx Culture, Routine  Result Value Ref Range   Specific Gravity, UA 1.015 1.005 - 1.030   pH, UA 5.5 5.0 - 7.5   Color, UA Yellow Yellow   Appearance Ur Clear Clear   Leukocytes, UA Negative Negative   Protein, UA Negative Negative/Trace   Glucose, UA Negative Negative   Ketones, UA Negative Negative   RBC, UA Negative Negative   Bilirubin, UA Negative Negative   Urobilinogen, Ur 0.2 0.2 - 1.0 mg/dL   Nitrite, UA Negative Negative      Assessment & Plan:   Problem List Items Addressed This Visit      Genitourinary   Benign hypertensive renal disease - Primary    Under good control. Continue regimen. Continue to monitor. Call with any concerns.       Relevant  Orders   Comprehensive metabolic panel   Microalbumin, Urine Waived     Other   HLD (hyperlipidemia)    Under good control. Continue regimen. Continue to monitor. Call with any concerns.       Relevant Medications   simvastatin (ZOCOR) 40 MG tablet   metoprolol tartrate (LOPRESSOR) 25 MG tablet  lisinopril (PRINIVIL,ZESTRIL) 40 MG tablet   Other Relevant Orders   Comprehensive metabolic panel   Lipid Panel w/o Chol/HDL Ratio       Follow up plan: Return in about 6 months (around 04/03/2017) for Physical.

## 2016-10-02 ENCOUNTER — Encounter: Payer: Self-pay | Admitting: Family Medicine

## 2016-10-02 LAB — COMPREHENSIVE METABOLIC PANEL
A/G RATIO: 1.8 (ref 1.2–2.2)
ALK PHOS: 126 IU/L — AB (ref 39–117)
ALT: 17 IU/L (ref 0–44)
AST: 32 IU/L (ref 0–40)
Albumin: 4.2 g/dL (ref 3.6–4.8)
BUN/Creatinine Ratio: 14 (ref 10–24)
BUN: 19 mg/dL (ref 8–27)
Bilirubin Total: 0.4 mg/dL (ref 0.0–1.2)
CALCIUM: 9.3 mg/dL (ref 8.6–10.2)
CHLORIDE: 100 mmol/L (ref 96–106)
CO2: 22 mmol/L (ref 18–29)
Creatinine, Ser: 1.32 mg/dL — ABNORMAL HIGH (ref 0.76–1.27)
GFR calc Af Amer: 66 mL/min/{1.73_m2} (ref >59)
GFR, EST NON AFRICAN AMERICAN: 57 mL/min/{1.73_m2} — AB (ref >59)
Globulin, Total: 2.4 g/dL (ref 1.5–4.5)
Glucose: 115 mg/dL — ABNORMAL HIGH (ref 65–99)
Potassium: 4.1 mmol/L (ref 3.5–5.2)
Sodium: 138 mmol/L (ref 134–144)
Total Protein: 6.6 g/dL (ref 6.0–8.5)

## 2016-10-02 LAB — LIPID PANEL W/O CHOL/HDL RATIO
CHOLESTEROL TOTAL: 154 mg/dL (ref 100–199)
HDL: 59 mg/dL (ref >39)
LDL CALC: 78 mg/dL (ref 0–99)
Triglycerides: 87 mg/dL (ref 0–149)
VLDL CHOLESTEROL CAL: 17 mg/dL (ref 5–40)

## 2017-02-21 ENCOUNTER — Ambulatory Visit (INDEPENDENT_AMBULATORY_CARE_PROVIDER_SITE_OTHER): Payer: Medicare HMO

## 2017-02-21 VITALS — BP 133/76 | HR 59 | Temp 97.9°F | Resp 17 | Ht 65.0 in | Wt 205.8 lb

## 2017-02-21 DIAGNOSIS — Z0000 Encounter for general adult medical examination without abnormal findings: Secondary | ICD-10-CM | POA: Diagnosis not present

## 2017-02-21 DIAGNOSIS — Z23 Encounter for immunization: Secondary | ICD-10-CM

## 2017-02-21 NOTE — Progress Notes (Signed)
Subjective:   Mike [name] Mike [name]. is a 61 y.o. male who presents for Medicare Annual/Subsequent preventive examination.  Review of Systems:  Cardiac Risk Factors include: male gender;advanced age (>37men, >75 women);obesity (BMI >30kg/m2);hypertension;dyslipidemia     Objective:    Vitals: BP 133/76 (BP Location: Left Arm, Patient Position: Sitting)   Pulse (!) 59   Temp 97.9 F (36.6 C) (Oral)   Resp 17   Ht 5\' 5"  (1.651 m)   Wt 205 lb 12.8 oz (93.4 kg)   BMI 34.25 kg/m   Body mass index is 34.25 kg/m.  Tobacco History  Smoking Status  . Former Smoker  . Packs/day: 1.00  . Years: 30.00  . Types: Cigarettes  . Quit date: 04/12/2016  Smokeless Tobacco  . Never Used    Comment: since 67, max 1 ppd x10     Counseling given: Not Answered   Past Medical History:  Diagnosis Date  . Arthritis   . Benign enlargement of prostate   . CAD (coronary artery disease)   . Chronic kidney disease   . History of seizures as a child    Due to head injury  . Hyperlipidemia   . Hypertension   . Lumbago   . Myocardial infarction ([place])   . Urinary hesitancy    Past Surgical History:  Procedure Laterality Date  . coronary artery stent     Family History  Problem Relation Age of Onset  . Hypertension Mother   . Breast cancer Mother   . Hypertension Father   . Stroke Father   . Stroke Maternal Grandfather    History  Sexual Activity  . Sexual activity: Yes  . Birth control/ protection: None    Outpatient Encounter Prescriptions as of 02/21/2017  Medication Sig  . aspirin EC 81 MG tablet Take 81 mg by mouth daily.   Marland Kitchen lisinopril (PRINIVIL,ZESTRIL) 40 MG tablet Take 1 tablet (40 mg total) by mouth daily.  . metoprolol tartrate (LOPRESSOR) 25 MG tablet Take 1 tablet (25 mg total) by mouth 2 (two) times daily.  . simvastatin (ZOCOR) 40 MG tablet Take 1 tablet (40 mg total) by mouth daily.  . tamsulosin (FLOMAX) 0.4 MG CAPS capsule Take 1 capsule (0.4 mg total) by  mouth daily.  Marland Kitchen EPINEPHrine (EPIPEN 2-PAK) 0.3 mg/0.3 mL IJ SOAJ injection Inject into the muscle once.   No facility-administered encounter medications on file as of 02/21/2017.     Activities of Daily Living In your present state of health, do you have any difficulty performing the following activities: 02/21/2017 10/01/2016  Hearing? N N  Vision? N N  Difficulty concentrating or making decisions? N N  Walking or climbing stairs? Y Y  Comment - Sciatica Pain  Dressing or bathing? N N  Doing errands, shopping? N N  Preparing Food and eating ? N -  Using the Toilet? N -  In the past six months, have you accidently leaked urine? N -  Do you have problems with loss of bowel control? N -  Managing your Medications? N -  Managing your Finances? N -  Housekeeping or managing your Housekeeping? N -  Some recent data might be hidden    Patient Care Team: Valerie Roys, DO as PCP - General (Family Medicine)   Assessment:     Exercise Activities and Dietary recommendations Current Exercise Habits: Home exercise routine, Type of exercise: strength training/weights (stationary bike), Time (Minutes): > 60, Frequency (Times/Week): 6, Weekly Exercise (Minutes/Week): 0, Intensity:  Mild, Exercise limited by: orthopedic condition(s)  Goals    None     Fall Risk Fall Risk  02/21/2017 10/01/2016 03/20/2015  Falls in the past year? No No No   Depression Screen PHQ 2/9 Scores 02/21/2017 10/01/2016 04/02/2016 03/20/2015  PHQ - 2 Score 0 0 0 0  PHQ- 9 Score - - 1 -    Cognitive Function     6CIT Screen 02/21/2017 04/02/2016  What Year? 0 points 0 points  What month? 0 points 0 points  What time? 0 points 0 points  Count back from 20 0 points 0 points  Months in reverse 0 points 0 points  Repeat phrase 2 points 2 points  Total Score 2 2    Immunization History  Administered Date(s) Administered  . Influenza,inj,Quad PF,6+ Mos 03/20/2015, 04/02/2016, 02/21/2017  . Pneumococcal  Conjugate-13 02/16/2014  . Tdap 05/20/2010  . Zoster 07/19/2014   Screening Tests Health Maintenance  Topic Date Due  . INFLUENZA VACCINE  12/18/2016  . COLONOSCOPY  05/04/2019  . TETANUS/TDAP  05/20/2020  . Hepatitis C Screening  Completed  . HIV Screening  Completed      Plan:    I have personally reviewed and addressed the Medicare Annual Wellness questionnaire and have noted the following in the patient's chart:  A. Medical and social history B. Use of alcohol, tobacco or illicit drugs  C. Current medications and supplements D. Functional ability and status E.  Nutritional status F.  Physical activity G. Advance directives H. List of other physicians I.  Hospitalizations, surgeries, and ER visits in previous 12 months J.  [place] such as hearing and vision if needed, cognitive and depression L. Referrals and appointments  In addition, I have reviewed and discussed with patient certain preventive protocols, quality metrics, and best practice recommendations. A written personalized care plan for preventive services as well as general preventive health recommendations were provided to patient.   Signed,  Tyler Aas, LPN Nurse Health Advisor   MD Recommendations: none

## 2017-02-21 NOTE — Patient Instructions (Addendum)
Mike [name] , Thank you for taking time to come for your Medicare Wellness Visit. I appreciate your ongoing commitment to your health goals. Please review the following plan we discussed and let me know if I can assist you in the future.   Screening recommendations/referrals: Colonoscopy: completed 09/16/2014 Recommended yearly ophthalmology/optometry visit for glaucoma screening and checkup Recommended yearly dental visit for hygiene and checkup  Vaccinations: Influenza vaccine: done today  Pneumococcal vaccine: due at age 67 Tdap vaccine: up to date Shingles vaccine: up to date  Advanced directives: Advance directive discussed with you today. Even though you declined this today please call our office should you change your mind and we can give you the proper paperwork for you to fill out.  Conditions/risks identified: none  Next appointment: Follow up on 04/07/2017 at 2:00pm with Dr.Johnson. Follow up in one year for your annual wellness exam.   Preventive Care 40-64 Years, Male Preventive care refers to lifestyle choices and visits with your health care provider that can promote health and wellness. What does preventive care include?  A yearly physical exam. This is also called an annual well check.  Dental exams once or twice a year.  Routine eye exams. Ask your health care provider how often you should have your eyes checked.  Personal lifestyle choices, including:  Daily care of your teeth and gums.  Regular physical activity.  Eating a healthy diet.  Avoiding tobacco and drug use.  Limiting alcohol use.  Practicing safe sex.  Taking low-dose aspirin every day starting at age 69. What happens during an annual well check? The services and screenings done by your health care provider during your annual well check will depend on your age, overall health, lifestyle risk factors, and family history of disease. Counseling  Your health care provider may ask you questions  about your:  Alcohol use.  Tobacco use.  Drug use.  Emotional well-being.  Home and relationship well-being.  Sexual activity.  Eating habits.  Work and work Statistician. Screening  You may have the following tests or measurements:  Height, weight, and BMI.  Blood pressure.  Lipid and cholesterol levels. These may be checked every 5 years, or more frequently if you are over 21 years old.  Skin check.  Lung cancer screening. You may have this screening every year starting at age 3 if you have a 30-pack-year history of smoking and currently smoke or have quit within the past 15 years.  Fecal occult blood test (FOBT) of the stool. You may have this test every year starting at age 28.  Flexible sigmoidoscopy or colonoscopy. You may have a sigmoidoscopy every 5 years or a colonoscopy every 10 years starting at age 19.  Prostate cancer screening. Recommendations will vary depending on your family history and other risks.  Hepatitis C blood test.  Hepatitis B blood test.  Sexually transmitted disease (STD) testing.  Diabetes screening. This is done by checking your blood sugar (glucose) after you have not eaten for a while (fasting). You may have this done every 1-3 years. Discuss your test results, treatment options, and if necessary, the need for more tests with your health care provider. Vaccines  Your health care provider may recommend certain vaccines, such as:  Influenza vaccine. This is recommended every year.  Tetanus, diphtheria, and acellular pertussis (Tdap, Td) vaccine. You may need a Td booster every 10 years.  Zoster vaccine. You may need this after age 55.  Pneumococcal 13-valent conjugate (PCV13) vaccine. You may  need this if you have certain conditions and have not been vaccinated.  Pneumococcal polysaccharide (PPSV23) vaccine. You may need one or two doses if you smoke cigarettes or if you have certain conditions. Talk to your health care provider  about which screenings and vaccines you need and how often you need them. This information is not intended to replace advice given to you by your health care provider. Make sure you discuss any questions you have with your health care provider. Document Released: 06/02/2015 Document Revised: 01/24/2016 Document Reviewed: 03/07/2015 Elsevier Interactive Patient Education  2017 [place] Prevention in the Home Falls can cause injuries. They can happen to people of all ages. There are many things you can do to make your home safe and to help prevent falls. What can I do on the outside of my home?  Regularly fix the edges of walkways and driveways and fix any cracks.  Remove anything that might make you trip as you walk through a door, such as a raised step or threshold.  Trim any bushes or trees on the path to your home.  Use bright outdoor lighting.  Clear any walking paths of anything that might make someone trip, such as rocks or tools.  Regularly check to see if handrails are loose or broken. Make sure that both sides of any steps have handrails.  Any raised decks and porches should have guardrails on the edges.  Have any leaves, snow, or ice cleared regularly.  Use sand or salt on walking paths during winter.  Clean up any spills in your garage right away. This includes oil or grease spills. What can I do in the bathroom?  Use night lights.  Install grab bars by the toilet and in the tub and shower. Do not use towel bars as grab bars.  Use non-skid mats or decals in the tub or shower.  If you need to sit down in the shower, use a plastic, non-slip stool.  Keep the floor dry. Clean up any water that spills on the floor as soon as it happens.  Remove soap buildup in the tub or shower regularly.  Attach bath mats securely with double-sided non-slip rug tape.  Do not have throw rugs and other things on the floor that can make you trip. What can I do in the  bedroom?  Use night lights.  Make sure that you have a light by your bed that is easy to reach.  Do not use any sheets or blankets that are too big for your bed. They should not hang down onto the floor.  Have a firm chair that has side arms. You can use this for support while you get dressed.  Do not have throw rugs and other things on the floor that can make you trip. What can I do in the kitchen?  Clean up any spills right away.  Avoid walking on wet floors.  Keep items that you use a lot in easy-to-reach places.  If you need to reach something above you, use a strong step stool that has a grab bar.  Keep electrical cords out of the way.  Do not use floor polish or wax that makes floors slippery. If you must use wax, use non-skid floor wax.  Do not have throw rugs and other things on the floor that can make you trip. What can I do with my stairs?  Do not leave any items on the stairs.  Make sure that there are  handrails on both sides of the stairs and use them. Fix handrails that are broken or loose. Make sure that handrails are as long as the stairways.  Check any carpeting to make sure that it is firmly attached to the stairs. Fix any carpet that is loose or worn.  Avoid having throw rugs at the top or bottom of the stairs. If you do have throw rugs, attach them to the floor with carpet tape.  Make sure that you have a light switch at the top of the stairs and the bottom of the stairs. If you do not have them, ask someone to add them for you. What else can I do to help prevent falls?  Wear shoes that:  Do not have high heels.  Have rubber bottoms.  Are comfortable and fit you well.  Are closed at the toe. Do not wear sandals.  If you use a stepladder:  Make sure that it is fully opened. Do not climb a closed stepladder.  Make sure that both sides of the stepladder are locked into place.  Ask someone to hold it for you, if possible.  Clearly mark and make  sure that you can see:  Any grab bars or handrails.  First and last steps.  Where the edge of each step is.  Use tools that help you move around (mobility aids) if they are needed. These include:  Canes.  Walkers.  Scooters.  Crutches.  Turn on the lights when you go into a dark area. Replace any light bulbs as soon as they burn out.  Set up your furniture so you have a clear path. Avoid moving your furniture around.  If any of your floors are uneven, fix them.  If there are any pets around you, be aware of where they are.  Review your medicines with your doctor. Some medicines can make you feel dizzy. This can increase your chance of falling. Ask your doctor what other things that you can do to help prevent falls. This information is not intended to replace advice given to you by your health care provider. Make sure you discuss any questions you have with your health care provider. Document Released: 03/02/2009 Document Revised: 10/12/2015 Document Reviewed: 06/10/2014 Elsevier Interactive Patient Education  2017 [place].  Influenza (Flu) Vaccine (Inactivated or Recombinant): What You Need to Know 1. Why get vaccinated? Influenza ("flu") is a contagious disease that spreads around the [place] every year, usually between October and May. Flu is caused by influenza viruses, and is spread mainly by coughing, sneezing, and close contact. Anyone can get flu. Flu strikes suddenly and can last several days. Symptoms vary by age, but can include:  fever/chills  sore throat  muscle aches  fatigue  cough  headache  runny or stuffy nose  Flu can also lead to pneumonia and blood infections, and cause diarrhea and seizures in children. If you have a medical condition, such as heart or lung disease, flu can make it worse. Flu is more dangerous for some people. Infants and young children, people 40 years of age and older, pregnant women, and people with certain  health conditions or a weakened immune system are at greatest risk. Each year thousands of people in the [place] die from flu, and many more are hospitalized. Flu vaccine can:  keep you from getting flu,  make flu less severe if you do get it, and  keep you from spreading flu to your family and other people. 2.  Inactivated and recombinant flu vaccines A dose of flu vaccine is recommended every flu season. Children 6 months through 24 years of age may need two doses during the same flu season. Everyone else needs only one dose each flu season. Some inactivated flu vaccines contain a very small amount of a mercury-based preservative called thimerosal. Studies have not shown thimerosal in vaccines to be harmful, but flu vaccines that do not contain thimerosal are available. There is no live flu virus in flu shots. They cannot cause the flu. There are many flu viruses, and they are always changing. Each year a new flu vaccine is made to protect against three or four viruses that are likely to cause disease in the upcoming flu season. But even when the vaccine doesn't exactly match these viruses, it may still provide some protection. Flu vaccine cannot prevent:  flu that is caused by a virus not covered by the vaccine, or  illnesses that look like flu but are not.  It takes about 2 weeks for protection to develop after vaccination, and protection lasts through the flu season. 3. Some people should not get this vaccine Tell the person who is giving you the vaccine:  If you have any severe, life-threatening allergies. If you ever had a life-threatening allergic reaction after a dose of flu vaccine, or have a severe allergy to any part of this vaccine, you may be advised not to get vaccinated. Most, but not all, types of flu vaccine contain a small amount of egg protein.  If you ever had Guillain-Barr Syndrome (also called GBS). Some people with a history of GBS should not get this vaccine.  This should be discussed with your doctor.  If you are not feeling well. It is usually okay to get flu vaccine when you have a mild illness, but you might be asked to come back when you feel better.  4. Risks of a vaccine reaction With any medicine, including vaccines, there is a chance of reactions. These are usually mild and go away on their own, but serious reactions are also possible. Most people who get a flu shot do not have any problems with it. Minor problems following a flu shot include:  soreness, redness, or swelling where the shot was given  hoarseness  sore, red or itchy eyes  cough  fever  aches  headache  itching  fatigue  If these problems occur, they usually begin soon after the shot and last 1 or 2 days. More serious problems following a flu shot can include the following:  There may be a small increased risk of Guillain-Barre Syndrome (GBS) after inactivated flu vaccine. This risk has been estimated at 1 or 2 additional cases per million people vaccinated. This is much lower than the risk of severe complications from flu, which can be prevented by flu vaccine.  Young children who get the flu shot along with pneumococcal vaccine (PCV13) and/or DTaP vaccine at the same time might be slightly more likely to have a seizure caused by fever. Ask your doctor for more information. Tell your doctor if a child who is getting flu vaccine has ever had a seizure.  Problems that could happen after any injected vaccine:  People sometimes faint after a medical procedure, including vaccination. Sitting or lying down for about 15 minutes can help prevent fainting, and injuries caused by a fall. Tell your doctor if you feel dizzy, or have vision changes or ringing in the ears.  Some people get severe  pain in the shoulder and have difficulty moving the arm where a shot was given. This happens very rarely.  Any medication can cause a severe allergic reaction. Such reactions from  a vaccine are very rare, estimated at about 1 in a million doses, and would happen within a few minutes to a few hours after the vaccination. As with any medicine, there is a very remote chance of a vaccine causing a serious injury or death. The safety of vaccines is always being monitored. For more information, visit: http://www.aguilar.org/ 5. What if there is a serious reaction? What should I look for? Look for anything that concerns you, such as signs of a severe allergic reaction, very high fever, or unusual behavior. Signs of a severe allergic reaction can include hives, swelling of the face and throat, difficulty breathing, a fast heartbeat, dizziness, and weakness. These would start a few minutes to a few hours after the vaccination. What should I do?  If you think it is a severe allergic reaction or other emergency that can't wait, call 9-1-1 and get the person to the nearest hospital. Otherwise, call your doctor.  Reactions should be reported to the Vaccine Adverse Event Reporting System (VAERS). Your doctor should file this report, or you can do it yourself through the VAERS web site at www.vaers.SamedayNews.es, or by calling 930-483-4073. ? VAERS does not give medical advice. 6. The National Vaccine Injury Compensation Program The [employer] Vaccine Injury Compensation Program (VICP) is a federal program that was created to compensate people who may have been injured by certain vaccines. Persons who believe they may have been injured by a vaccine can learn about the program and about filing a claim by calling 561-639-1093 or visiting the [place] website at GoldCloset.com.ee. There is a time limit to file a claim for compensation. 7. How can I learn more?  Ask your healthcare provider. He or she can give you the vaccine package insert or suggest other sources of information.  Call your local or state health department.  Contact the Centers for Disease Control and Prevention  (CDC): ? Call 802-197-4607 (1-800-CDC-INFO) or ? Visit CDC's website at https://gibson.com/ Vaccine Information Statement, Inactivated Influenza Vaccine (12/24/2013) This information is not intended to replace advice given to you by your health care provider. Make sure you discuss any questions you have with your health care provider. Document Released: 02/28/2006 Document Revised: 01/25/2016 Document Reviewed: 01/25/2016 Elsevier Interactive Patient Education  2017 [employer].

## 2017-04-07 ENCOUNTER — Ambulatory Visit (INDEPENDENT_AMBULATORY_CARE_PROVIDER_SITE_OTHER): Payer: Medicare HMO | Admitting: Family Medicine

## 2017-04-07 ENCOUNTER — Encounter: Payer: Self-pay | Admitting: Family Medicine

## 2017-04-07 ENCOUNTER — Telehealth: Payer: Self-pay | Admitting: Family Medicine

## 2017-04-07 VITALS — BP 122/56 | HR 58 | Temp 97.4°F | Ht 64.6 in | Wt 210.3 lb

## 2017-04-07 DIAGNOSIS — N401 Enlarged prostate with lower urinary tract symptoms: Secondary | ICD-10-CM | POA: Diagnosis not present

## 2017-04-07 DIAGNOSIS — Z87891 Personal history of nicotine dependence: Secondary | ICD-10-CM | POA: Diagnosis not present

## 2017-04-07 DIAGNOSIS — G8929 Other chronic pain: Secondary | ICD-10-CM

## 2017-04-07 DIAGNOSIS — M5030 Other cervical disc degeneration, unspecified cervical region: Secondary | ICD-10-CM | POA: Diagnosis not present

## 2017-04-07 DIAGNOSIS — I129 Hypertensive chronic kidney disease with stage 1 through stage 4 chronic kidney disease, or unspecified chronic kidney disease: Secondary | ICD-10-CM | POA: Diagnosis not present

## 2017-04-07 DIAGNOSIS — N183 Chronic kidney disease, stage 3 unspecified: Secondary | ICD-10-CM

## 2017-04-07 DIAGNOSIS — E782 Mixed hyperlipidemia: Secondary | ICD-10-CM

## 2017-04-07 DIAGNOSIS — I2510 Atherosclerotic heart disease of native coronary artery without angina pectoris: Secondary | ICD-10-CM

## 2017-04-07 DIAGNOSIS — Z0000 Encounter for general adult medical examination without abnormal findings: Secondary | ICD-10-CM

## 2017-04-07 DIAGNOSIS — M4722 Other spondylosis with radiculopathy, cervical region: Secondary | ICD-10-CM | POA: Diagnosis not present

## 2017-04-07 DIAGNOSIS — M5441 Lumbago with sciatica, right side: Secondary | ICD-10-CM | POA: Diagnosis not present

## 2017-04-07 DIAGNOSIS — R3911 Hesitancy of micturition: Secondary | ICD-10-CM | POA: Diagnosis not present

## 2017-04-07 DIAGNOSIS — J449 Chronic obstructive pulmonary disease, unspecified: Secondary | ICD-10-CM | POA: Diagnosis not present

## 2017-04-07 LAB — UA/M W/RFLX CULTURE, ROUTINE
Bilirubin, UA: NEGATIVE
Glucose, UA: NEGATIVE
KETONES UA: NEGATIVE
Leukocytes, UA: NEGATIVE
NITRITE UA: NEGATIVE
Protein, UA: NEGATIVE
RBC UA: NEGATIVE
SPEC GRAV UA: 1.01 (ref 1.005–1.030)
UUROB: 0.2 mg/dL (ref 0.2–1.0)
pH, UA: 5 (ref 5.0–7.5)

## 2017-04-07 LAB — MICROALBUMIN, URINE WAIVED
CREATININE, URINE WAIVED: 100 mg/dL (ref 10–300)
MICROALB, UR WAIVED: 10 mg/L (ref 0–19)

## 2017-04-07 MED ORDER — ALBUTEROL SULFATE HFA 108 (90 BASE) MCG/ACT IN AERS: 2.0000 | INHALATION_SPRAY | Freq: Four times a day (QID) | RESPIRATORY_TRACT | 0 refills | Status: DC | PRN

## 2017-04-07 MED ORDER — SIMVASTATIN 40 MG PO TABS: 40.0000 mg | ORAL_TABLET | Freq: Every day | ORAL | 1 refills | Status: DC

## 2017-04-07 MED ORDER — LISINOPRIL 40 MG PO TABS: 40.0000 mg | ORAL_TABLET | Freq: Every day | ORAL | 1 refills | Status: DC

## 2017-04-07 MED ORDER — METOPROLOL TARTRATE 25 MG PO TABS: 25.0000 mg | ORAL_TABLET | Freq: Two times a day (BID) | ORAL | 1 refills | Status: DC

## 2017-04-07 MED ORDER — TAMSULOSIN HCL 0.4 MG PO CAPS: 0.4000 mg | ORAL_CAPSULE | Freq: Every day | ORAL | 1 refills | Status: DC

## 2017-04-07 MED ORDER — EPINEPHRINE 0.3 MG/0.3ML IJ SOAJ: 0.3000 mg | Freq: Once | INTRAMUSCULAR | 12 refills | Status: AC

## 2017-04-07 NOTE — Assessment & Plan Note (Signed)
Under good control on recheck. Continue current regimen. Continue to monitor. Call with any concerns.  

## 2017-04-07 NOTE — Assessment & Plan Note (Signed)
Stable. Continue current regimen. Continue to monitor. Call with any concerns.  

## 2017-04-07 NOTE — Assessment & Plan Note (Signed)
No pain. Continue to keep BP and cholesterol under good control. Call with any concerns.

## 2017-04-07 NOTE — Assessment & Plan Note (Signed)
Stable on current regimen. Continue to monitor. Call with any concerns.  

## 2017-04-07 NOTE — Assessment & Plan Note (Signed)
Former smoker x 1 year! Congratulated patient!

## 2017-04-07 NOTE — Assessment & Plan Note (Signed)
With some SOB last week. Start inhaler. Call with any concerns or if not getting better.

## 2017-04-07 NOTE — Assessment & Plan Note (Signed)
Rechecking levels today. Await results. Call with any concerns.  

## 2017-04-07 NOTE — Assessment & Plan Note (Addendum)
Acting up with some numbness and tingling and weakness. Has been getting worse. X-ray done in the past, which showed arthritis. Will obtain additional x-ray. Will likely need MRI. Call with any concerns.

## 2017-04-07 NOTE — Progress Notes (Signed)
BP (!) 122/56 (BP Location: Left Arm, Cuff Size: Large)   Pulse (!) 58   Temp (!) 97.4 F (36.3 C)   Ht 5' 4.6" (1.641 m)   Wt 210 lb 5 oz (95.4 kg)   SpO2 100%   BMI 35.43 kg/m    Subjective:    Patient ID: Mike [name]., male    DOB: [birth year], 61 y.o.   MRN: [id number]  HPI: Mike [name]. is a 61 y.o. male presenting on 04/07/2017 for comprehensive medical examination. Current medical complaints include:  HYPERTENSION / HYPERLIPIDEMIA Satisfied with current treatment? yes Duration of hypertension: chronic BP monitoring frequency: not checking BP medication side effects: no Past BP meds: lisinopril, metoprolol, flomax Duration of hyperlipidemia: chronic Cholesterol medication side effects: no Cholesterol supplements: none Past cholesterol medications: simvastatin Medication compliance: excellent compliance Aspirin: yes Recent stressors: no Recurrent headaches: no Visual changes: no Palpitations: no Dyspnea: no Chest pain: no Lower extremity edema: no Dizzy/lightheaded: no  BPH- medicine seems to help BPH status: controlled Satisfied with current treatment?: yes Medication side effects: yes Medication compliance: excellent compliance Duration: chronic Nocturia: 1/night Urinary frequency: yes Incomplete voiding: yes Urgency: no Weak urinary stream: no Straining to start stream: no Dysuria: no Onset: gradual Severity: moderate  COPD COPD status: stable Satisfied with current treatment?: yes Oxygen use: no Dyspnea frequency: very rarely  Cough frequency:  very rarely  Rescue inhaler frequency:   very rarely  Limitation of activity: no Pneumovax: Up to Date Influenza: Up to Date  NECK PAIN FOLLOW UP Diagnosis: Known arthritis 3 years ago- worse now with numbness and tingling  Status: exacerbated Treatments attempted: rest, ice, heat, APAP, ibuprofen, aleve, muscle relaxer and HEP  Compliant with recommended treatment:  yes Relief with NSAIDs?:  mild Location:Left Duration:chronic Severity: moderate Quality: sharp shooting, numb Frequency: constant Radiation: L arm Aggravating factors: movement, lifting Alleviating factors: nothing Weakness:  yes Paresthesias / decreased sensation:  yes  Fevers:  no  Interim Problems from his last visit: no  Depression Screen done today and results listed below:  Depression screen [facility] 2/9 04/07/2017 02/21/2017 10/01/2016 04/02/2016 03/20/2015  Decreased Interest 0 0 0 0 0  Down, Depressed, Hopeless 0 0 0 0 0  PHQ - 2 Score 0 0 0 0 0  Altered sleeping 1 - - 1 -  Tired, decreased energy 0 - - 0 -  Change in appetite 0 - - 0 -  Feeling bad or failure about yourself  0 - - 0 -  Trouble concentrating 0 - - 0 -  Moving slowly or fidgety/restless 0 - - 0 -  Suicidal thoughts 0 - - 0 -  PHQ-9 Score 1 - - 1 -    Past Medical History:  Past Medical History:  Diagnosis Date  . Arthritis   . Benign enlargement of prostate   . CAD (coronary artery disease)   . Chronic kidney disease   . History of seizures as a child    Due to head injury  . Hyperlipidemia   . Hypertension   . Lumbago   . Myocardial infarction ([place])   . Urinary hesitancy     Surgical History:  Past Surgical History:  Procedure Laterality Date  . coronary artery stent      Medications:  Current Outpatient Medications on File Prior to Visit  Medication Sig  . aspirin EC 81 MG tablet Take 81 mg by mouth daily.    No current facility-administered medications on file prior  to visit.     Allergies:  Allergies  Allergen Reactions  . Shellfish Allergy Anaphylaxis  . Peanuts [Peanut Oil] Rash  . Geralyn Flash [Fish Allergy] Hives    Social History:  Social History   Socioeconomic History  . Marital status: Single    Spouse name: Not on file  . Number of children: Not on file  . Years of education: Not on file  . Highest education level: Not on file  Social Needs  . Financial resource  strain: Not on file  . Food insecurity - worry: Not on file  . Food insecurity - inability: Not on file  . Transportation needs - medical: Not on file  . Transportation needs - non-medical: Not on file  Occupational History  . Not on file  Tobacco Use  . Smoking status: Former Smoker    Packs/day: 1.00    Years: 30.00    Pack years: 30.00    Types: Cigarettes    Last attempt to quit: 04/12/2016    Years since quitting: 0.9  . Smokeless tobacco: Never Used  . Tobacco comment: since 15, max 1 ppd x10  Substance and Sexual Activity  . Alcohol use: No    Alcohol/week: 0.0 oz  . Drug use: No  . Sexual activity: Yes    Birth control/protection: None  Other Topics Concern  . Not on file  Social History Narrative  . Not on file   Social History   Tobacco Use  Smoking Status Former Smoker  . Packs/day: 1.00  . Years: 30.00  . Pack years: 30.00  . Types: Cigarettes  . Last attempt to quit: 04/12/2016  . Years since quitting: 0.9  Smokeless Tobacco Never Used  Tobacco Comment   since 15, max 1 ppd x10   Social History   Substance and Sexual Activity  Alcohol Use No  . Alcohol/week: 0.0 oz    Family History:  Family History  Problem Relation Age of Onset  . Hypertension Mother   . Breast cancer Mother   . Hypertension Father   . Stroke Father   . Stroke Maternal Grandfather     Past medical history, surgical history, medications, allergies, family history and social history reviewed with patient today and changes made to appropriate areas of the chart.   Review of Systems  Constitutional: Negative.   HENT: Negative.   Eyes: Negative.   Respiratory: Negative.   Cardiovascular: Negative.   Gastrointestinal: Negative.   Genitourinary: Negative.   Musculoskeletal: Positive for back pain and neck pain. Negative for falls, joint pain and myalgias.  Skin: Negative.   Neurological: Positive for tingling and sensory change. Negative for dizziness, tremors, speech  change, focal weakness, seizures, loss of consciousness and headaches.  Psychiatric/Behavioral: Negative.     All other ROS negative except what is listed above and in the HPI.      Objective:    BP (!) 122/56 (BP Location: Left Arm, Cuff Size: Large)   Pulse (!) 58   Temp (!) 97.4 F (36.3 C)   Ht 5' 4.6" (1.641 m)   Wt 210 lb 5 oz (95.4 kg)   SpO2 100%   BMI 35.43 kg/m   Wt Readings from Last 3 Encounters:  04/07/17 210 lb 5 oz (95.4 kg)  02/21/17 205 lb 12.8 oz (93.4 kg)  10/01/16 199 lb 12.8 oz (90.6 kg)    Physical Exam  Constitutional: He is oriented to person, place, and time. He appears well-developed and well-nourished. No distress.  HENT:  Head: Normocephalic and atraumatic.  Right Ear: Hearing, tympanic membrane, external ear and ear canal normal.  Left Ear: Hearing, tympanic membrane, external ear and ear canal normal.  Nose: Nose normal.  Mouth/Throat: Uvula is midline, oropharynx is clear and moist and mucous membranes are normal. No oropharyngeal exudate.  Eyes: Conjunctivae and lids are normal. Pupils are equal, round, and reactive to light. Right eye exhibits no discharge. Left eye exhibits no discharge. No scleral icterus.  Neck: Neck supple. No JVD present. No tracheal deviation present. No thyromegaly present.  Cardiovascular: Normal rate, regular rhythm, normal heart sounds and intact distal pulses. Exam reveals no gallop and no friction rub.  No murmur heard. Pulmonary/Chest: Effort normal and breath sounds normal. No stridor. No respiratory distress. He has no wheezes. He has no rales. He exhibits no tenderness.  Abdominal: Soft. Bowel sounds are normal. He exhibits no distension and no mass. There is no tenderness. There is no rebound and no guarding.  Genitourinary:  Genitourinary Comments: Penis and prostate exam deferred at patient's request  Musculoskeletal: He exhibits tenderness. He exhibits no edema or deformity.  Lymphadenopathy:    He has no  cervical adenopathy.  Neurological: He is alert and oriented to person, place, and time. He has normal reflexes. He displays normal reflexes. No cranial nerve deficit. He exhibits normal muscle tone. Coordination normal.  Skin: Skin is warm, dry and intact. No rash noted. He is not diaphoretic. No erythema. No pallor.  Psychiatric: He has a normal mood and affect. His speech is normal and behavior is normal. Judgment and thought content normal. Cognition and memory are normal.  Nursing note and vitals reviewed. Neck Exam:    Tenderness to Palpation: no    Midline cervical spine: no    Paraspinal neck musculature: no    Trapezius: no    Sternocleidomastoid: no     Range of Motion:     Flexion: Decreased    Extension: Decreased    Lateral rotation: Decreased    Lateral bending: Decreased     Neuro Examination: decreased sensation over C4-5 on the L, otherwise normal, 5/5 strength throughout, normal reflexes    Special Tests:     Spurling test: positive on the L   Results for orders placed or performed in visit on 04/07/17  Microalbumin, Urine Waived  Result Value Ref Range   Microalb, Ur Waived 10 0 - 19 mg/L   Creatinine, Urine Waived 100 10 - 300 mg/dL   Microalb/Creat Ratio <30 <30 mg/g  UA/M w/rflx Culture, Routine  Result Value Ref Range   Specific Gravity, UA 1.010 1.005 - 1.030   pH, UA 5.0 5.0 - 7.5   Color, UA Yellow Yellow   Appearance Ur Clear Clear   Leukocytes, UA Negative Negative   Protein, UA Negative Negative/Trace   Glucose, UA Negative Negative   Ketones, UA Negative Negative   RBC, UA Negative Negative   Bilirubin, UA Negative Negative   Urobilinogen, Ur 0.2 0.2 - 1.0 mg/dL   Nitrite, UA Negative Negative      Assessment & Plan:   Problem List Items Addressed This Visit      Cardiovascular and Mediastinum   CAD (coronary artery disease)    No pain. Continue to keep BP and cholesterol under good control. Call with any concerns.       Relevant  Medications   lisinopril (PRINIVIL,ZESTRIL) 40 MG tablet   metoprolol tartrate (LOPRESSOR) 25 MG tablet   simvastatin (ZOCOR) 40 MG  tablet   EPINEPHrine (EPIPEN 2-PAK) 0.3 mg/0.3 mL IJ SOAJ injection     Respiratory   COPD (chronic obstructive pulmonary disease) (HCC)    With some SOB last week. Start inhaler. Call with any concerns or if not getting better.       Relevant Medications   albuterol (PROVENTIL HFA;VENTOLIN HFA) 108 (90 Base) MCG/ACT inhaler   Other Relevant Orders   CBC with Differential/Platelet   Comprehensive metabolic panel   TSH     Nervous and Auditory   Osteoarthritis of spine with radiculopathy, cervical region    Acting up with some numbness and tingling and weakness. Has been getting worse. X-ray done in the past, which showed arthritis. Will obtain additional x-ray. Will likely need MRI. Call with any concerns.         Genitourinary   BPH (benign prostatic hyperplasia)    Stable. Continue current regimen. Continue to monitor. Call with any concerns.       Relevant Medications   tamsulosin (FLOMAX) 0.4 MG CAPS capsule   Benign hypertensive renal disease    Under good control on recheck. Continue current regimen. Continue to monitor. Call with any concerns.       Relevant Orders   CBC with Differential/Platelet   Comprehensive metabolic panel   Microalbumin, Urine Waived (Completed)   TSH   CKD (chronic kidney disease), stage III (HCC)    Rechecking levels today. Await results. Call with any concerns.         Other   HLD (hyperlipidemia)    Stable on current regimen. Continue to monitor. Call with any concerns.       Relevant Medications   lisinopril (PRINIVIL,ZESTRIL) 40 MG tablet   metoprolol tartrate (LOPRESSOR) 25 MG tablet   simvastatin (ZOCOR) 40 MG tablet   EPINEPHrine (EPIPEN 2-PAK) 0.3 mg/0.3 mL IJ SOAJ injection   Other Relevant Orders   CBC with Differential/Platelet   Comprehensive metabolic panel   Lipid Panel w/o Chol/HDL  Ratio   TSH   Urinary hesitancy due to benign prostatic hyperplasia    Stable. Continue current regimen. Continue to monitor. Call with any concerns.       Relevant Orders   CBC with Differential/Platelet   Comprehensive metabolic panel   PSA   UA/M w/rflx Culture, Routine (Completed)   Chronic low back pain    Stretches given today. Call with any concerns or if not getting better or getting worse.       History of tobacco abuse    Former smoker x 1 year! Congratulated patient!       Other Visit Diagnoses    Routine general medical examination at a health care facility    -  Primary   Vaccines up to date. Screening labs checked today. Colonoscopy up to date. Continue diet and exercise. Call with any concerns    Other cervical disc degeneration, unspecified cervical region       Relevant Orders   MR Cervical Spine Wo Contrast       Discussed aspirin prophylaxis for myocardial infarction prevention and decision was made to continue ASA  LABORATORY TESTING:  Health maintenance labs ordered today as discussed above.   The natural history of prostate cancer and ongoing controversy regarding screening and potential treatment outcomes of prostate cancer has been discussed with the patient. The meaning of a false positive PSA and a false negative PSA has been discussed. He indicates understanding of the limitations of this screening test and wishes to proceed  with screening PSA testing.   IMMUNIZATIONS:   - Tdap: Tetanus vaccination status reviewed: last tetanus booster within 10 years. - Influenza: Up to date - Pneumovax: Not applicable - Prevnar: Up to date - Zostavax vaccine: Not applicable  SCREENING: - Colonoscopy: Up to date  Discussed with patient purpose of the colonoscopy is to detect colon cancer at curable precancerous or early stages   PATIENT COUNSELING:    Sexuality: Discussed sexually transmitted diseases, partner selection, use of condoms, avoidance of  unintended pregnancy  and contraceptive alternatives.   Advised to avoid cigarette smoking.  I discussed with the patient that most people either abstain from alcohol or drink within safe limits (<=14/week and <=4 drinks/occasion for males, <=7/weeks and <= 3 drinks/occasion for females) and that the risk for alcohol disorders and other health effects rises proportionally with the number of drinks per week and how often a drinker exceeds daily limits.  Discussed cessation/primary prevention of drug use and availability of treatment for abuse.   Diet: Encouraged to adjust caloric intake to maintain  or achieve ideal body weight, to reduce intake of dietary saturated fat and total fat, to limit sodium intake by avoiding high sodium foods and not adding table salt, and to maintain adequate dietary potassium and calcium preferably from fresh fruits, vegetables, and low-fat dairy products.    stressed the importance of regular exercise  Injury prevention: Discussed safety belts, safety helmets, smoke detector, smoking near bedding or upholstery.   Dental health: Discussed importance of regular tooth brushing, flossing, and dental visits.   Follow up plan: NEXT PREVENTATIVE PHYSICAL DUE IN 1 YEAR. Return in about 6 months (around 10/05/2017) for Follow up BP and Chol.

## 2017-04-07 NOTE — Assessment & Plan Note (Signed)
Stretches given today. Call with any concerns or if not getting better or getting worse.

## 2017-04-07 NOTE — Patient Instructions (Addendum)
Health Maintenance, Male A healthy lifestyle and preventive care is important for your health and wellness. Ask your health care provider about what schedule of regular examinations is right for you. What should I know about weight and diet? Eat a Healthy Diet  Eat plenty of vegetables, fruits, whole grains, low-fat dairy products, and lean protein.  Do not eat a lot of foods high in solid fats, added sugars, or salt.  Maintain a Healthy Weight Regular exercise can help you achieve or maintain a healthy weight. You should:  Do at least 150 minutes of exercise each week. The exercise should increase your heart rate and make you sweat (moderate-intensity exercise).  Do strength-training exercises at least twice a week.  Watch Your Levels of Cholesterol and Blood Lipids  Have your blood tested for lipids and cholesterol every 5 years starting at 62 years of age. If you are at high risk for heart disease, you should start having your blood tested when you are 62 years old. You may need to have your cholesterol levels checked more often if: ? Your lipid or cholesterol levels are high. ? You are older than 62 years of age. ? You are at high risk for heart disease.  What should I know about cancer screening? Many types of cancers can be detected early and may often be prevented. Lung Cancer  You should be screened every year for lung cancer if: ? You are a current smoker who has smoked for at least 30 years. ? You are a former smoker who has quit within the past 15 years.  Talk to your health care provider about your screening options, when you should start screening, and how often you should be screened.  Colorectal Cancer  Routine colorectal cancer screening usually begins at 62 years of age and should be repeated every 5-10 years until you are 62 years old. You may need to be screened more often if early forms of precancerous polyps or small growths are found. Your health care provider  may recommend screening at an earlier age if you have risk factors for colon cancer.  Your health care provider may recommend using home test kits to check for hidden blood in the stool.  A small camera at the end of a tube can be used to examine your colon (sigmoidoscopy or colonoscopy). This checks for the earliest forms of colorectal cancer.  Prostate and Testicular Cancer  Depending on your age and overall health, your health care provider may do certain tests to screen for prostate and testicular cancer.  Talk to your health care provider about any symptoms or concerns you have about testicular or prostate cancer.  Skin Cancer  Check your skin from head to toe regularly.  Tell your health care provider about any new moles or changes in moles, especially if: ? There is a change in a mole's size, shape, or color. ? You have a mole that is larger than a pencil eraser.  Always use sunscreen. Apply sunscreen liberally and repeat throughout the day.  Protect yourself by wearing long sleeves, pants, a wide-brimmed hat, and sunglasses when outside.  What should I know about heart disease, diabetes, and high blood pressure?  If you are 18-39 years of age, have your blood pressure checked every 3-5 years. If you are 40 years of age or older, have your blood pressure checked every year. You should have your blood pressure measured twice-once when you are at a hospital or clinic, and once when   you are not at a hospital or clinic. Record the average of the two measurements. To check your blood pressure when you are not at a hospital or clinic, you can use: ? An automated blood pressure machine at a pharmacy. ? A home blood pressure monitor.  Talk to your health care provider about your target blood pressure.  If you are between 66-76 years old, ask your health care provider if you should take aspirin to prevent heart disease.  Have regular diabetes screenings by checking your fasting blood  sugar level. ? If you are at a normal weight and have a low risk for diabetes, have this test once every three years after the age of 72. ? If you are overweight and have a high risk for diabetes, consider being tested at a younger age or more often.  A one-time screening for abdominal aortic aneurysm (AAA) by ultrasound is recommended for men aged 43-75 years who are current or former smokers. What should I know about preventing infection? Hepatitis B If you have a higher risk for hepatitis B, you should be screened for this virus. Talk with your health care provider to find out if you are at risk for hepatitis B infection. Hepatitis C Blood testing is recommended for:  Everyone born from [birth year] through 1966.  Anyone with known risk factors for hepatitis C.  Sexually Transmitted Diseases (STDs)  You should be screened each year for STDs including gonorrhea and chlamydia if: ? You are sexually active and are younger than 62 years of age. ? You are older than 62 years of age and your health care provider tells you that you are at risk for this type of infection. ? Your sexual activity has changed since you were last screened and you are at an increased risk for chlamydia or gonorrhea. Ask your health care provider if you are at risk.  Talk with your health care provider about whether you are at high risk of being infected with HIV. Your health care provider may recommend a prescription medicine to help prevent HIV infection.  What else can I do?  Schedule regular health, dental, and eye exams.  Stay current with your vaccines (immunizations).  Do not use any tobacco products, such as cigarettes, chewing tobacco, and e-cigarettes. If you need help quitting, ask your health care provider.  Limit alcohol intake to no more than 2 drinks per day. One drink equals 12 ounces of beer, 5 ounces of wine, or 1 ounces of hard liquor.  Do not use street drugs.  Do not share needles.  Ask your  health care provider for help if you need support or information about quitting drugs.  Tell your health care provider if you often feel depressed.  Tell your health care provider if you have ever been abused or do not feel safe at home. This information is not intended to replace advice given to you by your health care provider. Make sure you discuss any questions you have with your health care provider. Document Released: 11/02/2007 Document Revised: 01/03/2016 Document Reviewed: 02/07/2015 Elsevier Interactive Patient Education  2018 [employer].  Sciatica Rehab Ask your health care provider which exercises are safe for you. Do exercises exactly as told by your health care provider and adjust them as directed. It is normal to feel mild stretching, pulling, tightness, or discomfort as you do these exercises, but you should stop right away if you feel sudden pain or your pain gets worse.Do not begin these exercises  until told by your health care provider. Stretching and range of motion exercises These exercises warm up your muscles and joints and improve the movement and flexibility of your hips and your back. These exercises also help to relieve pain, numbness, and tingling. Exercise A: Sciatic nerve glide 1. Sit in a chair with your head facing down toward your chest. Place your hands behind your back. Let your shoulders slump forward. 2. Slowly straighten one of your knees while you tilt your head back as if you are looking toward the ceiling. Only straighten your leg as far as you can without making your symptoms worse. 3. Hold for __________ seconds. 4. Slowly return to the starting position. 5. Repeat with your other leg. Repeat __________ times. Complete this exercise __________ times a day. Exercise B: Knee to chest with hip adduction and internal rotation  1. Lie on your back on a firm surface with both legs straight. 2. Bend one of your knees and move it up toward your chest  until you feel a gentle stretch in your lower back and buttock. Then, move your knee toward the shoulder that is on the opposite side from your leg. ? Hold your leg in this position by holding onto the front of your knee. 3. Hold for __________ seconds. 4. Slowly return to the starting position. 5. Repeat with your other leg. Repeat __________ times. Complete this exercise __________ times a day. Exercise C: Prone extension on elbows  1. Lie on your abdomen on a firm surface. A bed may be too soft for this exercise. 2. Prop yourself up on your elbows. 3. Use your arms to help lift your chest up until you feel a gentle stretch in your abdomen and your lower back. ? This will place some of your body weight on your elbows. If this is uncomfortable, try stacking pillows under your chest. ? Your hips should stay down, against the surface that you are lying on. Keep your hip and back muscles relaxed. 4. Hold for __________ seconds. 5. Slowly relax your upper body and return to the starting position. Repeat __________ times. Complete this exercise __________ times a day. Strengthening exercises These exercises build strength and endurance in your back. Endurance is the ability to use your muscles for a long time, even after they get tired. Exercise D: Pelvic tilt 1. Lie on your back on a firm surface. Bend your knees and keep your feet flat. 2. Tense your abdominal muscles. Tip your pelvis up toward the ceiling and flatten your lower back into the floor. ? To help with this exercise, you may place a small towel under your lower back and try to push your back into the towel. 3. Hold for __________ seconds. 4. Let your muscles relax completely before you repeat this exercise. Repeat __________ times. Complete this exercise __________ times a day. Exercise E: Alternating arm and leg raises  1. Get on your hands and knees on a firm surface. If you are on a hard floor, you may want to use padding to  cushion your knees, such as an exercise mat. 2. Line up your arms and legs. Your hands should be below your shoulders, and your knees should be below your hips. 3. Lift your left leg behind you. At the same time, raise your right arm and straighten it in front of you. ? Do not lift your leg higher than your hip. ? Do not lift your arm higher than your shoulder. ? Keep your abdominal  and back muscles tight. ? Keep your hips facing the ground. ? Do not arch your back. ? Keep your balance carefully, and do not hold your breath. 4. Hold for __________ seconds. 5. Slowly return to the starting position and repeat with your right leg and your left arm. Repeat __________ times. Complete this exercise __________ times a day. Posture and body mechanics  Body mechanics refers to the movements and positions of your body while you do your daily activities. Posture is part of body mechanics. Good posture and healthy body mechanics can help to relieve stress in your body's tissues and joints. Good posture means that your spine is in its natural S-curve position (your spine is neutral), your shoulders are pulled back slightly, and your head is not tipped forward. The following are general guidelines for applying improved posture and body mechanics to your everyday activities. Standing   When standing, keep your spine neutral and your feet about hip-width apart. Keep a slight bend in your knees. Your ears, shoulders, and hips should line up.  When you do a task in which you stand in one place for a long time, place one foot up on a stable object that is 2-4 inches (5-10 cm) high, such as a footstool. This helps keep your spine neutral. Sitting   When sitting, keep your spine neutral and keep your feet flat on the floor. Use a footrest, if necessary, and keep your thighs parallel to the floor. Avoid rounding your shoulders, and avoid tilting your head forward.  When working at a desk or a computer, keep  your desk at a height where your hands are slightly lower than your elbows. Slide your chair under your desk so you are close enough to maintain good posture.  When working at a computer, place your monitor at a height where you are looking straight ahead and you do not have to tilt your head forward or downward to look at the screen. Resting   When lying down and resting, avoid positions that are most painful for you.  If you have pain with activities such as sitting, bending, stooping, or squatting (flexion-based activities), lie in a position in which your body does not bend very much. For example, avoid curling up on your side with your arms and knees near your chest (fetal position).  If you have pain with activities such as standing for a long time or reaching with your arms (extension-based activities), lie with your spine in a neutral position and bend your knees slightly. Try the following positions: ? Lying on your side with a pillow between your knees. ? Lying on your back with a pillow under your knees. Lifting   When lifting objects, keep your feet at least shoulder-width apart and tighten your abdominal muscles.  Bend your knees and hips and keep your spine neutral. It is important to lift using the strength of your legs, not your back. Do not lock your knees straight out.  Always ask for help to lift heavy or awkward objects. This information is not intended to replace advice given to you by your health care provider. Make sure you discuss any questions you have with your health care provider. Document Released: 05/06/2005 Document Revised: 01/11/2016 Document Reviewed: 01/20/2015 Elsevier Interactive Patient Education  [employer].

## 2017-04-07 NOTE — Telephone Encounter (Signed)
Called to let Dishon know that I was looking over his chart and it was an x-ray of his back that we had, not his neck, so we will likely need to get an x-ray for them to pay for his MRI. I've placed the order and he can go over to [place] and get it done whenever he'd like.   Tiff- can you try him tomorrow?

## 2017-04-08 ENCOUNTER — Telehealth: Payer: Self-pay | Admitting: Family Medicine

## 2017-04-08 ENCOUNTER — Ambulatory Visit
Admission: RE | Admit: 2017-04-08 | Discharge: 2017-04-08 | Disposition: A | Discharge status: Home / Self Care | Payer: Medicare HMO | Source: Ambulatory Visit | Attending: Family Medicine | Admitting: Family Medicine

## 2017-04-08 ENCOUNTER — Ambulatory Visit: Payer: Self-pay | Admitting: *Deleted

## 2017-04-08 DIAGNOSIS — M4722 Other spondylosis with radiculopathy, cervical region: Secondary | ICD-10-CM | POA: Diagnosis not present

## 2017-04-08 DIAGNOSIS — M542 Cervicalgia: Secondary | ICD-10-CM | POA: Diagnosis not present

## 2017-04-08 DIAGNOSIS — D649 Anemia, unspecified: Secondary | ICD-10-CM

## 2017-04-08 LAB — COMPREHENSIVE METABOLIC PANEL
A/G RATIO: 1.4 (ref 1.2–2.2)
ALT: 21 IU/L (ref 0–44)
AST: 32 IU/L (ref 0–40)
Albumin: 3.9 g/dL (ref 3.6–4.8)
Alkaline Phosphatase: 105 IU/L (ref 39–117)
BILIRUBIN TOTAL: 0.3 mg/dL (ref 0.0–1.2)
BUN/Creatinine Ratio: 10 (ref 10–24)
BUN: 12 mg/dL (ref 8–27)
CHLORIDE: 105 mmol/L (ref 96–106)
CO2: 24 mmol/L (ref 20–29)
Calcium: 9.1 mg/dL (ref 8.6–10.2)
Creatinine, Ser: 1.2 mg/dL (ref 0.76–1.27)
GFR calc non Af Amer: 64 mL/min/{1.73_m2} (ref >59)
GFR, EST AFRICAN AMERICAN: 74 mL/min/{1.73_m2} (ref >59)
Globulin, Total: 2.7 g/dL (ref 1.5–4.5)
Glucose: 92 mg/dL (ref 65–99)
POTASSIUM: 4.5 mmol/L (ref 3.5–5.2)
Sodium: 143 mmol/L (ref 134–144)
TOTAL PROTEIN: 6.6 g/dL (ref 6.0–8.5)

## 2017-04-08 LAB — CBC WITH DIFFERENTIAL/PLATELET
BASOS: 1 %
Basophils Absolute: 0.1 10*3/uL (ref 0.0–0.2)
EOS (ABSOLUTE): 0.9 10*3/uL — AB (ref 0.0–0.4)
Eos: 14 %
Hematocrit: 38.8 % (ref 37.5–51.0)
Hemoglobin: 12.9 g/dL — ABNORMAL LOW (ref 13.0–17.7)
Immature Grans (Abs): 0 10*3/uL (ref 0.0–0.1)
Immature Granulocytes: 1 %
Lymphocytes Absolute: 2.3 10*3/uL (ref 0.7–3.1)
Lymphs: 36 %
MCH: 25.6 pg — AB (ref 26.6–33.0)
MCHC: 33.2 g/dL (ref 31.5–35.7)
MCV: 77 fL — AB (ref 79–97)
MONOS ABS: 0.4 10*3/uL (ref 0.1–0.9)
Monocytes: 7 %
NEUTROS ABS: 2.7 10*3/uL (ref 1.4–7.0)
Neutrophils: 41 %
PLATELETS: 188 10*3/uL (ref 150–379)
RBC: 5.03 x10E6/uL (ref 4.14–5.80)
RDW: 14.4 % (ref 12.3–15.4)
WBC: 6.4 10*3/uL (ref 3.4–10.8)

## 2017-04-08 LAB — LIPID PANEL W/O CHOL/HDL RATIO
Cholesterol, Total: 145 mg/dL (ref 100–199)
HDL: 58 mg/dL (ref >39)
LDL Calculated: 68 mg/dL (ref 0–99)
Triglycerides: 96 mg/dL (ref 0–149)
VLDL Cholesterol Cal: 19 mg/dL (ref 5–40)

## 2017-04-08 LAB — PSA: Prostate Specific Ag, Serum: 0.3 ng/mL (ref 0.0–4.0)

## 2017-04-08 LAB — TSH: TSH: 2 u[IU]/mL (ref 0.450–4.500)

## 2017-04-08 NOTE — Telephone Encounter (Signed)
Opened in error

## 2017-04-08 NOTE — Telephone Encounter (Signed)
Called patient, no answer, unable to leave a message.

## 2017-04-08 NOTE — Telephone Encounter (Signed)
Let him know Dr. Wynetta Emery had placed the order for him to go to [place] and get his x-ray done whenever he wanted to go.  He stated,  "I'll probably go after Thanksgiving".   Let him know that would be ok.

## 2017-04-08 NOTE — Telephone Encounter (Signed)
Patient notified

## 2017-04-08 NOTE — Telephone Encounter (Signed)
Please let him know that his labs came back normal except he is a little anemic compared to last year- so I'd like to make sure this is just a fluke. I'd like him to come back to recheck his blood count in about a month. Everything else looks great! Thanks!

## 2017-04-18 ENCOUNTER — Telehealth: Payer: Self-pay | Admitting: Family Medicine

## 2017-04-18 ENCOUNTER — Ambulatory Visit
Admission: RE | Admit: 2017-04-18 | Discharge: 2017-04-18 | Disposition: A | Discharge status: Home / Self Care | Payer: Medicare HMO | Source: Ambulatory Visit | Attending: Family Medicine | Admitting: Family Medicine

## 2017-04-18 DIAGNOSIS — M4802 Spinal stenosis, cervical region: Secondary | ICD-10-CM | POA: Diagnosis not present

## 2017-04-18 DIAGNOSIS — M5030 Other cervical disc degeneration, unspecified cervical region: Secondary | ICD-10-CM | POA: Insufficient documentation

## 2017-04-18 DIAGNOSIS — M542 Cervicalgia: Secondary | ICD-10-CM | POA: Diagnosis not present

## 2017-04-18 DIAGNOSIS — G9589 Other specified diseases of spinal cord: Secondary | ICD-10-CM | POA: Insufficient documentation

## 2017-04-18 NOTE — Telephone Encounter (Signed)
Called pt to discuss cervical MRI results which showed significant stenosis with evidence of cord compression and edema. Answered pt's questions and discussed urgent referral to neurosurgery. Pt understanding and will let us know if sxs worsen in meantime

## 2017-04-25 ENCOUNTER — Telehealth: Payer: Self-pay | Admitting: Family Medicine

## 2017-04-25 NOTE — Telephone Encounter (Signed)
Copied from [place] [phone] [phone]. Topic: Quick Communication - See Telephone Encounter >> Apr 25, 2017 10:40 AM Boyd Kerbs wrote: CRM for notification. See Telephone encounter for:  [facility] - Urgent Dec. 13 at 3:00 Dr. Izora Ribas 04/25/17.

## 2017-04-30 ENCOUNTER — Other Ambulatory Visit: Payer: Self-pay | Admitting: Family Medicine

## 2017-04-30 NOTE — Telephone Encounter (Signed)
Called and spoke to patient. He states that he got a call from [facility] last week about an appointment and he told them he wanted to wait and schedule this week due to the weather and transportation. I explained that the message I got said that he is scheduled for 05/01/17 at 3:00 pm with them. Provided patient with [facility] phone number so he could call and see if he has an appointment scheduled for tomorrow or not.

## 2017-04-30 NOTE — Telephone Encounter (Signed)
Just received message. Not sure if pt. Calling in regards to referral or if pt. Has already been notified.   Please disregard if already resolved.  Please Advise.

## 2017-08-07 ENCOUNTER — Telehealth: Payer: Self-pay | Admitting: *Deleted

## 2017-08-07 DIAGNOSIS — Z87891 Personal history of nicotine dependence: Secondary | ICD-10-CM

## 2017-08-07 DIAGNOSIS — Z122 Encounter for screening for malignant neoplasm of respiratory organs: Secondary | ICD-10-CM

## 2017-08-07 NOTE — Telephone Encounter (Signed)
Notified patient that annual lung cancer screening low dose CT scan is due currently or will be in near future. Confirmed that patient is within the age range of 55-77, and asymptomatic, (no signs or symptoms of lung cancer). Patient denies illness that would prevent curative treatment for lung cancer if found. Verified smoking history, (former, quit 11/17, 30 pack year). The shared decision making visit was done 05/07/16. Patient is agreeable for CT scan being scheduled.

## 2017-08-13 ENCOUNTER — Ambulatory Visit
Admission: RE | Admit: 2017-08-13 | Discharge: 2017-08-13 | Disposition: A | Discharge status: Home / Self Care | Payer: Medicare HMO | Source: Ambulatory Visit | Attending: Nurse Practitioner | Admitting: Nurse Practitioner

## 2017-08-13 DIAGNOSIS — Z122 Encounter for screening for malignant neoplasm of respiratory organs: Secondary | ICD-10-CM | POA: Diagnosis not present

## 2017-08-13 DIAGNOSIS — I700 Atherosclerosis of aorta: Secondary | ICD-10-CM | POA: Diagnosis not present

## 2017-08-13 DIAGNOSIS — Z87891 Personal history of nicotine dependence: Secondary | ICD-10-CM

## 2017-08-18 ENCOUNTER — Encounter: Payer: Self-pay | Admitting: *Deleted

## 2017-10-06 ENCOUNTER — Ambulatory Visit (INDEPENDENT_AMBULATORY_CARE_PROVIDER_SITE_OTHER): Payer: Medicare HMO | Admitting: Family Medicine

## 2017-10-06 ENCOUNTER — Other Ambulatory Visit: Payer: Self-pay

## 2017-10-06 ENCOUNTER — Encounter: Payer: Self-pay | Admitting: Family Medicine

## 2017-10-06 VITALS — BP 126/66 | HR 55 | Temp 98.4°F | Wt 199.1 lb

## 2017-10-06 DIAGNOSIS — D649 Anemia, unspecified: Secondary | ICD-10-CM | POA: Diagnosis not present

## 2017-10-06 DIAGNOSIS — E782 Mixed hyperlipidemia: Secondary | ICD-10-CM

## 2017-10-06 DIAGNOSIS — N183 Chronic kidney disease, stage 3 unspecified: Secondary | ICD-10-CM

## 2017-10-06 DIAGNOSIS — I129 Hypertensive chronic kidney disease with stage 1 through stage 4 chronic kidney disease, or unspecified chronic kidney disease: Secondary | ICD-10-CM | POA: Diagnosis not present

## 2017-10-06 MED ORDER — SIMVASTATIN 40 MG PO TABS: 40.0000 mg | ORAL_TABLET | Freq: Every day | ORAL | 1 refills | Status: DC

## 2017-10-06 MED ORDER — METOPROLOL TARTRATE 25 MG PO TABS: 25.0000 mg | ORAL_TABLET | Freq: Two times a day (BID) | ORAL | 1 refills | Status: DC

## 2017-10-06 MED ORDER — LISINOPRIL 40 MG PO TABS: 40.0000 mg | ORAL_TABLET | Freq: Every day | ORAL | 1 refills | Status: DC

## 2017-10-06 MED ORDER — ALBUTEROL SULFATE HFA 108 (90 BASE) MCG/ACT IN AERS: INHALATION_SPRAY | RESPIRATORY_TRACT | 0 refills | Status: DC

## 2017-10-06 MED ORDER — TAMSULOSIN HCL 0.4 MG PO CAPS: 0.4000 mg | ORAL_CAPSULE | Freq: Every day | ORAL | 1 refills | Status: DC

## 2017-10-06 NOTE — Progress Notes (Signed)
BP 126/66 (BP Location: Right Arm, Patient Position: Sitting, Cuff Size: Large)   Pulse (!) 55   Temp 98.4 F (36.9 C) (Oral)   Wt 199 lb 1.6 oz (90.3 kg)   SpO2 99%   BMI 33.13 kg/m    Subjective:    Patient ID: Mike [name]., male    DOB: 07-21-1955, 62 y.o.   MRN: [id number]  HPI: Uchenna Rappaport Langham [name] is a 62 y.o. male  Chief Complaint  Patient presents with  . Hypertension  . Hyperlipidemia  . Anemia   HYPERTENSION / [place] Satisfied with current treatment? yes Duration of hypertension: chronic BP monitoring frequency: not checking BP medication side effects: no Past BP meds: lisinopril, metoprolol Duration of hyperlipidemia: chronic Cholesterol medication side effects: no Cholesterol supplements: none Past cholesterol medications: simvastatin Medication compliance: excellent compliance Aspirin: no Recent stressors: no Recurrent headaches: no Visual changes: no Palpitations: no Dyspnea: no Chest pain: no Lower extremity edema: no Dizzy/lightheaded: no  ANEMIA Anemia status: stable Compliance with treatment: good compliance Iron supplementation side effects: no Severity of anemia: mild Fatigue: no Decreased exercise tolerance: no  Dyspnea on exertion: no Palpitations: no Bleeding: no Pica: yes  Relevant past medical, surgical, family and social history reviewed and updated as indicated. Interim medical history since our last visit reviewed. Allergies and medications reviewed and updated.  Review of Systems  Constitutional: Negative.   Respiratory: Negative.   Cardiovascular: Negative.   Psychiatric/Behavioral: Negative.     Per HPI unless specifically indicated above     Objective:    BP 126/66 (BP Location: Right Arm, Patient Position: Sitting, Cuff Size: Large)   Pulse (!) 55   Temp 98.4 F (36.9 C) (Oral)   Wt 199 lb 1.6 oz (90.3 kg)   SpO2 99%   BMI 33.13 kg/m   Wt Readings from Last 3 Encounters:    10/06/17 199 lb 1.6 oz (90.3 kg)  08/13/17 210 lb (95.3 kg)  04/07/17 210 lb 5 oz (95.4 kg)    Physical Exam  Constitutional: He is oriented to person, place, and time. He appears well-developed and well-nourished. No distress.  HENT:  Head: Normocephalic and atraumatic.  Right Ear: Hearing normal.  Left Ear: Hearing normal.  Nose: Nose normal.  Eyes: Conjunctivae and lids are normal. Right eye exhibits no discharge. Left eye exhibits no discharge. No scleral icterus.  Cardiovascular: Normal rate, regular rhythm, normal heart sounds and intact distal pulses. Exam reveals no gallop and no friction rub.  No murmur heard. Pulmonary/Chest: Effort normal and breath sounds normal. No stridor. No respiratory distress. He has no wheezes. He has no rales. He exhibits no tenderness.  Musculoskeletal: Normal range of motion.  Neurological: He is alert and oriented to person, place, and time.  Skin: Skin is warm, dry and intact. Capillary refill takes less than 2 seconds. No rash noted. He is not diaphoretic. No erythema. No pallor.  Psychiatric: He has a normal mood and affect. His speech is normal and behavior is normal. Judgment and thought content normal. Cognition and memory are normal.  Nursing note and vitals reviewed.   Results for orders placed or performed in visit on 04/07/17  CBC with Differential/Platelet  Result Value Ref Range   WBC 6.4 3.4 - 10.8 x10E3/uL   RBC 5.03 4.14 - 5.80 x10E6/uL   Hemoglobin 12.9 (L) 13.0 - 17.7 g/dL   Hematocrit 38.8 37.5 - 51.0 %   MCV 77 (L) 79 - 97 fL   MCH  25.6 (L) 26.6 - 33.0 pg   MCHC 33.2 31.5 - 35.7 g/dL   RDW 14.4 12.3 - 15.4 %   Platelets 188 150 - 379 x10E3/uL   Neutrophils 41 Not Estab. %   Lymphs 36 Not Estab. %   Monocytes 7 Not Estab. %   Eos 14 Not Estab. %   Basos 1 Not Estab. %   Neutrophils Absolute 2.7 1.4 - 7.0 x10E3/uL   Lymphocytes Absolute 2.3 0.7 - 3.1 x10E3/uL   Monocytes Absolute 0.4 0.1 - 0.9 x10E3/uL   EOS  (ABSOLUTE) 0.9 (H) 0.0 - 0.4 x10E3/uL   Basophils Absolute 0.1 0.0 - 0.2 x10E3/uL   Immature Granulocytes 1 Not Estab. %   Immature Grans (Abs) 0.0 0.0 - 0.1 x10E3/uL  Comprehensive metabolic panel  Result Value Ref Range   Glucose 92 65 - 99 mg/dL   BUN 12 8 - 27 mg/dL   Creatinine, Ser 1.20 0.76 - 1.27 mg/dL   GFR calc non Af Amer 64 >59 mL/min/1.73   GFR calc Af Amer 74 >59 mL/min/1.73   BUN/Creatinine Ratio 10 10 - 24   Sodium 143 134 - 144 mmol/L   Potassium 4.5 3.5 - 5.2 mmol/L   Chloride 105 96 - 106 mmol/L   CO2 24 20 - 29 mmol/L   Calcium 9.1 8.6 - 10.2 mg/dL   Total Protein 6.6 6.0 - 8.5 g/dL   Albumin 3.9 3.6 - 4.8 g/dL   Globulin, Total 2.7 1.5 - 4.5 g/dL   Albumin/Globulin Ratio 1.4 1.2 - 2.2   Bilirubin Total 0.3 0.0 - 1.2 mg/dL   Alkaline Phosphatase 105 39 - 117 IU/L   AST 32 0 - 40 IU/L   ALT 21 0 - 44 IU/L  Lipid Panel w/o Chol/HDL Ratio  Result Value Ref Range   Cholesterol, Total 145 100 - 199 mg/dL   Triglycerides 96 0 - 149 mg/dL   HDL 58 >39 mg/dL   VLDL Cholesterol Cal 19 5 - 40 mg/dL   LDL Calculated 68 0 - 99 mg/dL  Microalbumin, Urine Waived  Result Value Ref Range   Microalb, Ur Waived 10 0 - 19 mg/L   Creatinine, Urine Waived 100 10 - 300 mg/dL   Microalb/Creat Ratio <30 <30 mg/g  PSA  Result Value Ref Range   Prostate Specific Ag, Serum 0.3 0.0 - 4.0 ng/mL  TSH  Result Value Ref Range   TSH 2.000 0.450 - 4.500 uIU/mL  UA/M w/rflx Culture, Routine  Result Value Ref Range   Specific Gravity, UA 1.010 1.005 - 1.030   pH, UA 5.0 5.0 - 7.5   Color, UA Yellow Yellow   Appearance Ur Clear Clear   Leukocytes, UA Negative Negative   Protein, UA Negative Negative/Trace   Glucose, UA Negative Negative   Ketones, UA Negative Negative   RBC, UA Negative Negative   Bilirubin, UA Negative Negative   Urobilinogen, Ur 0.2 0.2 - 1.0 mg/dL   Nitrite, UA Negative Negative      Assessment & Plan:   Problem List Items Addressed This Visit       Genitourinary   Benign hypertensive renal disease - Primary    Under good control. Continue current regimen. Refills given today. Call with any concerns.       Relevant Orders   Comprehensive metabolic panel   CKD (chronic kidney disease), stage III ([place])    Under good control. Continue current regimen. Refills given today. Call with any concerns.  Relevant Orders   Comprehensive metabolic panel     Other   HLD (hyperlipidemia)    Under good control. Continue current regimen. Refills given today. Call with any concerns.       Relevant Medications   lisinopril (PRINIVIL,ZESTRIL) 40 MG tablet   metoprolol tartrate (LOPRESSOR) 25 MG tablet   simvastatin (ZOCOR) 40 MG tablet   Other Relevant Orders   Comprehensive metabolic panel   Lipid Panel w/o Chol/HDL Ratio    Other Visit Diagnoses    Anemia, unspecified type       Rechecking levels today. Await results. Call with any concerns.        Follow up plan: Return in about 6 months (around 04/08/2018) for Physical.

## 2017-10-06 NOTE — Assessment & Plan Note (Signed)
Under good control. Continue current regimen. Refills given today. Call with any concerns.

## 2017-10-07 ENCOUNTER — Telehealth: Payer: Self-pay | Admitting: Family Medicine

## 2017-10-07 DIAGNOSIS — N289 Disorder of kidney and ureter, unspecified: Secondary | ICD-10-CM

## 2017-10-07 LAB — COMPREHENSIVE METABOLIC PANEL
ALT: 19 IU/L (ref 0–44)
AST: 34 IU/L (ref 0–40)
Albumin/Globulin Ratio: 1.4 (ref 1.2–2.2)
Albumin: 4 g/dL (ref 3.6–4.8)
Alkaline Phosphatase: 126 IU/L — ABNORMAL HIGH (ref 39–117)
BUN / CREAT RATIO: 10 (ref 10–24)
BUN: 15 mg/dL (ref 8–27)
Bilirubin Total: 0.5 mg/dL (ref 0.0–1.2)
CALCIUM: 9.3 mg/dL (ref 8.6–10.2)
CO2: 21 mmol/L (ref 20–29)
Chloride: 104 mmol/L (ref 96–106)
Creatinine, Ser: 1.47 mg/dL — ABNORMAL HIGH (ref 0.76–1.27)
GFR, EST AFRICAN AMERICAN: 58 mL/min/{1.73_m2} — AB (ref >59)
GFR, EST NON AFRICAN AMERICAN: 50 mL/min/{1.73_m2} — AB (ref >59)
GLOBULIN, TOTAL: 2.8 g/dL (ref 1.5–4.5)
Glucose: 97 mg/dL (ref 65–99)
Potassium: 4.4 mmol/L (ref 3.5–5.2)
SODIUM: 142 mmol/L (ref 134–144)
TOTAL PROTEIN: 6.8 g/dL (ref 6.0–8.5)

## 2017-10-07 LAB — LIPID PANEL W/O CHOL/HDL RATIO
Cholesterol, Total: 136 mg/dL (ref 100–199)
HDL: 67 mg/dL (ref >39)
LDL Calculated: 60 mg/dL (ref 0–99)
Triglycerides: 46 mg/dL (ref 0–149)
VLDL Cholesterol Cal: 9 mg/dL (ref 5–40)

## 2017-10-07 LAB — CBC WITH DIFFERENTIAL/PLATELET
BASOS ABS: 0 10*3/uL (ref 0.0–0.2)
Basos: 1 %
EOS (ABSOLUTE): 0.3 10*3/uL (ref 0.0–0.4)
EOS: 4 %
HEMOGLOBIN: 13.3 g/dL (ref 13.0–17.7)
Hematocrit: 40 % (ref 37.5–51.0)
IMMATURE GRANS (ABS): 0 10*3/uL (ref 0.0–0.1)
Immature Granulocytes: 0 %
LYMPHS: 23 %
Lymphocytes Absolute: 1.3 10*3/uL (ref 0.7–3.1)
MCH: 25 pg — AB (ref 26.6–33.0)
MCHC: 33.3 g/dL (ref 31.5–35.7)
MCV: 75 fL — ABNORMAL LOW (ref 79–97)
MONOCYTES: 7 %
Monocytes Absolute: 0.4 10*3/uL (ref 0.1–0.9)
NEUTROS ABS: 3.7 10*3/uL (ref 1.4–7.0)
Neutrophils: 65 %
Platelets: 179 10*3/uL (ref 150–450)
RBC: 5.32 x10E6/uL (ref 4.14–5.80)
RDW: 14.9 % (ref 12.3–15.4)
WBC: 5.7 10*3/uL (ref 3.4–10.8)

## 2017-10-07 LAB — IRON AND TIBC
IRON SATURATION: 24 % (ref 15–55)
Iron: 72 ug/dL (ref 38–169)
Total Iron Binding Capacity: 301 ug/dL (ref 250–450)
UIBC: 229 ug/dL (ref 111–343)

## 2017-10-07 LAB — B12 AND FOLATE PANEL
FOLATE: 6.1 ng/mL (ref >3.0)
VITAMIN B 12: 379 pg/mL (ref 232–1245)

## 2017-10-07 LAB — FERRITIN: FERRITIN: 39 ng/mL (ref 30–400)

## 2017-10-07 NOTE — Telephone Encounter (Signed)
Please let him know that his labs came back good. No anemia. His kidney function is a bit high, this may be because he's a bit dehydrated. I'd like him to drink a whole lot of water and come in in about 2 weeks for a recheck on his kidney function. Order in. Thanks!

## 2017-10-07 NOTE — Telephone Encounter (Signed)
Patient notified and verbalized understanding. Michela Pitcher he will come in around June 4th for his lab draw.

## 2017-10-21 ENCOUNTER — Other Ambulatory Visit: Payer: Medicare HMO

## 2017-10-21 DIAGNOSIS — N289 Disorder of kidney and ureter, unspecified: Secondary | ICD-10-CM | POA: Diagnosis not present

## 2017-10-22 ENCOUNTER — Encounter: Payer: Self-pay | Admitting: Family Medicine

## 2017-10-22 LAB — BASIC METABOLIC PANEL
BUN / CREAT RATIO: 14 (ref 10–24)
BUN: 19 mg/dL (ref 8–27)
CHLORIDE: 102 mmol/L (ref 96–106)
CO2: 22 mmol/L (ref 20–29)
Calcium: 9.6 mg/dL (ref 8.6–10.2)
Creatinine, Ser: 1.35 mg/dL — ABNORMAL HIGH (ref 0.76–1.27)
GFR, EST AFRICAN AMERICAN: 64 mL/min/{1.73_m2} (ref >59)
GFR, EST NON AFRICAN AMERICAN: 55 mL/min/{1.73_m2} — AB (ref >59)
Glucose: 80 mg/dL (ref 65–99)
Potassium: 4.3 mmol/L (ref 3.5–5.2)
Sodium: 140 mmol/L (ref 134–144)

## 2018-02-25 ENCOUNTER — Ambulatory Visit (INDEPENDENT_AMBULATORY_CARE_PROVIDER_SITE_OTHER): Payer: Medicare HMO

## 2018-02-25 VITALS — BP 120/62 | HR 67 | Temp 97.6°F | Resp 16 | Ht 65.0 in | Wt 200.0 lb

## 2018-02-25 DIAGNOSIS — Z23 Encounter for immunization: Secondary | ICD-10-CM

## 2018-02-25 DIAGNOSIS — Z0000 Encounter for general adult medical examination without abnormal findings: Secondary | ICD-10-CM

## 2018-02-25 NOTE — Progress Notes (Signed)
Subjective:   Mike Ludington Geeting [name] is a 62 y.o. male who presents for Medicare Annual/Subsequent preventive examination.  Review of Systems:  Cardiac Risk Factors include: advanced age (>76men, >55 women);dyslipidemia;hypertension;male gender;obesity (BMI >30kg/m2);smoking/ tobacco exposure     Objective:    Vitals: BP 120/62 (BP Location: Left Arm, Patient Position: Sitting)   Pulse 67   Temp 97.6 F (36.4 C) (Temporal)   Resp 16   Ht 5\' 5"  (1.651 m)   Wt 200 lb (90.7 kg)   BMI 33.28 kg/m   Body mass index is 33.28 kg/m.  Advanced Directives 02/25/2018 02/21/2017 04/02/2016  Does Patient Have a Medical Advance Directive? No No No  Would patient like information on creating a medical advance directive? Yes (MAU/Ambulatory/Procedural Areas - Information given) No - Patient declined Yes - Educational materials given    Tobacco Social History   Tobacco Use  Smoking Status Former Smoker  . Packs/day: 1.00  . Years: 30.00  . Pack years: 30.00  . Types: Cigarettes  . Last attempt to quit: 04/12/2016  . Years since quitting: 1.8  Smokeless Tobacco Never Used  Tobacco Comment   since 15, max 1 ppd x10     Counseling given: Not Answered Comment: since 15, max 1 ppd x10   Clinical Intake:  Pre-visit preparation completed: Yes  Pain : No/denies pain     Nutritional Status: BMI > 30  Obese Nutritional Risks: None Diabetes: No  How often do you need to have someone help you when you read instructions, pamphlets, or other written materials from your doctor or pharmacy?: 1 - Never What is the last grade level you completed in school?: some college   Interpreter Needed?: No  Information entered by :: Tiffany Hill,LPN   Past Medical History:  Diagnosis Date  . Arthritis   . Benign enlargement of prostate   . CAD (coronary artery disease)   . Chronic kidney disease   . History of seizures as a child    Due to head injury  . Hyperlipidemia   . Hypertension    . Lumbago   . Myocardial infarction ([place])   . Urinary hesitancy    Past Surgical History:  Procedure Laterality Date  . coronary artery stent     Family History  Problem Relation Age of Onset  . Hypertension Mother   . Breast cancer Mother   . Hypertension Father   . Stroke Father   . Stroke Maternal Grandfather    Social History   Socioeconomic History  . Marital status: Divorced    Spouse name: Not on file  . Number of children: Not on file  . Years of education: Not on file  . Highest education level: Some college, no degree  Occupational History  . Not on file  Social Needs  . Financial resource strain: Somewhat hard  . Food insecurity:    Worry: Sometimes true    Inability: Never true  . Transportation needs:    Medical: No    Non-medical: Yes  Tobacco Use  . Smoking status: Former Smoker    Packs/day: 1.00    Years: 30.00    Pack years: 30.00    Types: Cigarettes    Last attempt to quit: 04/12/2016    Years since quitting: 1.8  . Smokeless tobacco: Never Used  . Tobacco comment: since 15, max 1 ppd x10  Substance and Sexual Activity  . Alcohol use: No    Alcohol/week: 0.0 standard drinks  . Drug  use: No  . Sexual activity: Yes    Birth control/protection: None  Lifestyle  . Physical activity:    Days per week: 6 days    Minutes per session: 60 min  . Stress: Not at all  Relationships  . Social connections:    Talks on phone: More than three times a week    Gets together: More than three times a week    Attends religious service: Never    Active member of club or organization: Yes    Attends meetings of clubs or organizations: More than 4 times per year    Relationship status: Divorced  Other Topics Concern  . Not on file  Social History Narrative   Goes to gym 6-7 days a week     Outpatient Encounter Medications as of 02/25/2018  Medication Sig  . albuterol (VENTOLIN HFA) 108 (90 Base) MCG/ACT inhaler INHALE 2 PUFFS INTO THE LUNGS EVERY 6   HOURS AS NEEDED FOR WHEEZING OR SHORTNESS OF BREATH.  Marland Kitchen aspirin EC 81 MG tablet Take 81 mg by mouth daily.   Marland Kitchen lisinopril (PRINIVIL,ZESTRIL) 40 MG tablet Take 1 tablet (40 mg total) by mouth daily.  . metoprolol tartrate (LOPRESSOR) 25 MG tablet Take 1 tablet (25 mg total) by mouth 2 (two) times daily.  . simvastatin (ZOCOR) 40 MG tablet Take 1 tablet (40 mg total) by mouth daily.  . tamsulosin (FLOMAX) 0.4 MG CAPS capsule Take 1 capsule (0.4 mg total) by mouth daily.  Marland Kitchen EPINEPHrine 0.3 mg/0.3 mL IJ SOAJ injection    No facility-administered encounter medications on file as of 02/25/2018.     Activities of Daily Living In your present state of health, do you have any difficulty performing the following activities: 02/25/2018 04/07/2017  Hearing? N N  Comment declines hearing aids  -  Vision? N N  Comment reading glasses -  Difficulty concentrating or making decisions? N N  Walking or climbing stairs? Y Y  Comment sciatic nerve  -  Dressing or bathing? N N  Doing errands, shopping? Y N  Comment has a scooter but has problems with groceries  -  Conservation officer, nature and eating ? N -  Comment has total dentures  -  Using the Toilet? N -  In the past six months, have you accidently leaked urine? N -  Do you have problems with loss of bowel control? N -  Managing your Medications? N -  Managing your Finances? N -  Housekeeping or managing your Housekeeping? N -  Some recent data might be hidden    Patient Care Team: Valerie Roys, DO as PCP - General (Family Medicine)   Assessment:   This is a routine wellness examination for Mike [name].  Exercise Activities and Dietary recommendations Current Exercise Habits: Home exercise routine, Type of exercise: strength training/weights;treadmill;stretching, Time (Minutes): 60, Frequency (Times/Week): 7, Weekly Exercise (Minutes/Week): 420, Intensity: Moderate, Exercise limited by: None identified  Goals    . DIET - INCREASE WATER INTAKE      Recommend continue drinking at least 6-8 glasses of water a day        Fall Risk Fall Risk  02/25/2018 04/07/2017 02/21/2017 10/01/2016 03/20/2015  Falls in the past year? No No No No No   FALL RISK PREVENTION PERTAINING TO THE HOME:  Any stairs in or around the home WITH handrails? No  Home free of loose throw rugs in walkways, pet beds, electrical cords, etc? Yes  Adequate lighting in your home to reduce  risk of falls? Yes   ASSISTIVE DEVICES UTILIZED TO PREVENT FALLS:  Life alert? No  Use of a cane, walker or w/c? No  Grab bars in the bathroom? No  Shower chair or bench in shower? No  Elevated toilet seat or a handicapped toilet? No   DME ORDERS:  DME order needed?  No   TIMED UP AND GO:  Was the test performed? Yes .  Length of time to ambulate 10 feet: 8 sec.   GAIT:  Appearance of gait: Gait steady-fast and without the use of an assistive device.  Intervention(s) required? No    Depression Screen PHQ 2/9 Scores 02/25/2018 04/07/2017 02/21/2017 10/01/2016  PHQ - 2 Score 0 0 0 0  PHQ- 9 Score - 1 - -    Cognitive Function     6CIT Screen 02/25/2018 02/21/2017 04/02/2016  What Year? 0 points 0 points 0 points  What month? 0 points 0 points 0 points  What time? 0 points 0 points 0 points  Count back from 20 0 points 0 points 0 points  Months in reverse 0 points 0 points 0 points  Repeat phrase 2 points 2 points 2 points  Total Score 2 2 2     Immunization History  Administered Date(s) Administered  . Influenza,inj,Quad PF,6+ Mos 03/20/2015, 04/02/2016, 02/21/2017, 02/25/2018  . Pneumococcal Conjugate-13 02/16/2014  . Tdap 05/20/2010  . Zoster 07/19/2014    Qualifies for Shingles Vaccine? Yes  Zostavax completed 07/19/2014. Due for Shingrix. Education has been provided regarding the importance of this vaccine. Pt has been advised to call insurance company to determine out of pocket expense. Advised may also receive vaccine at local pharmacy or Health Dept.  Verbalized acceptance and understanding.  Tetanus/Tdap: completed 05/20/2010  Flu Vaccine: Due for Flu vaccine. Does the patient want to receive this vaccine today?  Yes .   Pneumococcal Vaccine: due at age 50  Screening Tests Health Maintenance  Topic Date Due  . INFLUENZA VACCINE  12/18/2017  . COLONOSCOPY  05/04/2019  . TETANUS/TDAP  05/20/2020  . Hepatitis C Screening  Completed  . HIV Screening  Completed   Cancer Screenings:  Colorectal Screening: Completed 05/03/2014. Repeat every 5 years   Lung Cancer Screening: (Low Dose CT Chest recommended if Age 60-80 years, 30 pack-year currently smoking OR have quit w/in 15years.) does qualify. Completed 08/13/2017  Additional Screening:  Hepatitis C Screening: does qualify; Completed 03/20/2015  Vision Screening: Recommended annual ophthalmology exams for early detection of glaucoma and other disorders of the eye. Is the patient up to date with their annual eye exam?  No  Who is the provider or what is the name of the office in which the pt attends annual eye exams? n/a If pt is not established with a provider, would they like to be referred to a provider to establish care? No .   Dental Screening: Recommended annual dental exams for proper oral hygiene  Community Resource Referral:  CRR required this visit?  Yes, has run out of food in the past. Placed referral for food bank resources.       Plan:    I have personally reviewed and addressed the Medicare Annual Wellness questionnaire and have noted the following in the patient's chart:  A. Medical and social history B. Use of alcohol, tobacco or illicit drugs  C. Current medications and supplements D. Functional ability and status E.  Nutritional status F.  Physical activity G. Advance directives H. List of other physicians I.  Hospitalizations, surgeries, and ER visits in previous 12 months J.  [place] such as hearing and vision if needed, cognitive and  depression L. Referrals and appointments   In addition, I have reviewed and discussed with patient certain preventive protocols, quality metrics, and best practice recommendations. A written personalized care plan for preventive services as well as general preventive health recommendations were provided to patient.   Signed,  Tyler Aas, LPN Nurse Health Advisor   Nurse Notes:none

## 2018-02-25 NOTE — Patient Instructions (Addendum)
Mike [name] , Thank you for taking time to come for your Medicare Wellness Visit. I appreciate your ongoing commitment to your health goals. Please review the following plan we discussed and let me know if I can assist you in the future.   Screening recommendations/referrals: Colonoscopy: completed 05/03/2014  Recommended yearly ophthalmology/optometry visit for glaucoma screening and checkup Recommended yearly dental visit for hygiene and checkup  Vaccinations: Influenza vaccine: done today  Pneumococcal vaccine: due at age 62 Tdap vaccine: completed 05/20/2010 Shingles vaccine: shingrix eligible, check with your insurance company for coverage   Advanced directives: Advance directive discussed with you today. I have provided a copy for you to complete at home and have notarized. Once this is complete please bring a copy in to our office so we can scan it into your chart.  Conditions/risks identified: Recommend continue drinking at least 6-8 glasses of water a day   Next appointment: Follow up in one year for your annual wellness exam.   Preventive Care 40-64 Years, Male Preventive care refers to lifestyle choices and visits with your health care provider that can promote health and wellness. What does preventive care include?  A yearly physical exam. This is also called an annual well check.  Dental exams once or twice a year.  Routine eye exams. Ask your health care provider how often you should have your eyes checked.  Personal lifestyle choices, including:  Daily care of your teeth and gums.  Regular physical activity.  Eating a healthy diet.  Avoiding tobacco and drug use.  Limiting alcohol use.  Practicing safe sex.  Taking low-dose aspirin every day starting at age 63. What happens during an annual well check? The services and screenings done by your health care provider during your annual well check will depend on your age, overall health, lifestyle risk factors,  and family history of disease. Counseling  Your health care provider may ask you questions about your:  Alcohol use.  Tobacco use.  Drug use.  Emotional well-being.  Home and relationship well-being.  Sexual activity.  Eating habits.  Work and work Statistician. Screening  You may have the following tests or measurements:  Height, weight, and BMI.  Blood pressure.  Lipid and cholesterol levels. These may be checked every 5 years, or more frequently if you are over 65 years old.  Skin check.  Lung cancer screening. You may have this screening every year starting at age 63 if you have a 30-pack-year history of smoking and currently smoke or have quit within the past 15 years.  Fecal occult blood test (FOBT) of the stool. You may have this test every year starting at age 63.  Flexible sigmoidoscopy or colonoscopy. You may have a sigmoidoscopy every 5 years or a colonoscopy every 10 years starting at age 63.  Prostate cancer screening. Recommendations will vary depending on your family history and other risks.  Hepatitis C blood test.  Hepatitis B blood test.  Sexually transmitted disease (STD) testing.  Diabetes screening. This is done by checking your blood sugar (glucose) after you have not eaten for a while (fasting). You may have this done every 1-3 years. Discuss your test results, treatment options, and if necessary, the need for more tests with your health care provider. Vaccines  Your health care provider may recommend certain vaccines, such as:  Influenza vaccine. This is recommended every year.  Tetanus, diphtheria, and acellular pertussis (Tdap, Td) vaccine. You may need a Td booster every 10 years.  Zoster  vaccine. You may need this after age 63  Pneumococcal 13-valent conjugate (PCV13) vaccine. You may need this if you have certain conditions and have not been vaccinated.  Pneumococcal polysaccharide (PPSV23) vaccine. You may need one or two doses if  you smoke cigarettes or if you have certain conditions. Talk to your health care provider about which screenings and vaccines you need and how often you need them. This information is not intended to replace advice given to you by your health care provider. Make sure you discuss any questions you have with your health care provider. Document Released: 06/02/2015 Document Revised: 01/24/2016 Document Reviewed: 03/07/2015 Elsevier Interactive Patient Education  2017 [place] Prevention in the Home Falls can cause injuries. They can happen to people of all ages. There are many things you can do to make your home safe and to help prevent falls. What can I do on the outside of my home?  Regularly fix the edges of walkways and driveways and fix any cracks.  Remove anything that might make you trip as you walk through a door, such as a raised step or threshold.  Trim any bushes or trees on the path to your home.  Use bright outdoor lighting.  Clear any walking paths of anything that might make someone trip, such as rocks or tools.  Regularly check to see if handrails are loose or broken. Make sure that both sides of any steps have handrails.  Any raised decks and porches should have guardrails on the edges.  Have any leaves, snow, or ice cleared regularly.  Use sand or salt on walking paths during winter.  Clean up any spills in your garage right away. This includes oil or grease spills. What can I do in the bathroom?  Use night lights.  Install grab bars by the toilet and in the tub and shower. Do not use towel bars as grab bars.  Use non-skid mats or decals in the tub or shower.  If you need to sit down in the shower, use a plastic, non-slip stool.  Keep the floor dry. Clean up any water that spills on the floor as soon as it happens.  Remove soap buildup in the tub or shower regularly.  Attach bath mats securely with double-sided non-slip rug tape.  Do not have  throw rugs and other things on the floor that can make you trip. What can I do in the bedroom?  Use night lights.  Make sure that you have a light by your bed that is easy to reach.  Do not use any sheets or blankets that are too big for your bed. They should not hang down onto the floor.  Have a firm chair that has side arms. You can use this for support while you get dressed.  Do not have throw rugs and other things on the floor that can make you trip. What can I do in the kitchen?  Clean up any spills right away.  Avoid walking on wet floors.  Keep items that you use a lot in easy-to-reach places.  If you need to reach something above you, use a strong step stool that has a grab bar.  Keep electrical cords out of the way.  Do not use floor polish or wax that makes floors slippery. If you must use wax, use non-skid floor wax.  Do not have throw rugs and other things on the floor that can make you trip. What can I do with my  stairs?  Do not leave any items on the stairs.  Make sure that there are handrails on both sides of the stairs and use them. Fix handrails that are broken or loose. Make sure that handrails are as long as the stairways.  Check any carpeting to make sure that it is firmly attached to the stairs. Fix any carpet that is loose or worn.  Avoid having throw rugs at the top or bottom of the stairs. If you do have throw rugs, attach them to the floor with carpet tape.  Make sure that you have a light switch at the top of the stairs and the bottom of the stairs. If you do not have them, ask someone to add them for you. What else can I do to help prevent falls?  Wear shoes that:  Do not have high heels.  Have rubber bottoms.  Are comfortable and fit you well.  Are closed at the toe. Do not wear sandals.  If you use a stepladder:  Make sure that it is fully opened. Do not climb a closed stepladder.  Make sure that both sides of the stepladder are  locked into place.  Ask someone to hold it for you, if possible.  Clearly mark and make sure that you can see:  Any grab bars or handrails.  First and last steps.  Where the edge of each step is.  Use tools that help you move around (mobility aids) if they are needed. These include:  Canes.  Walkers.  Scooters.  Crutches.  Turn on the lights when you go into a dark area. Replace any light bulbs as soon as they burn out.  Set up your furniture so you have a clear path. Avoid moving your furniture around.  If any of your floors are uneven, fix them.  If there are any pets around you, be aware of where they are.  Review your medicines with your doctor. Some medicines can make you feel dizzy. This can increase your chance of falling. Ask your doctor what other things that you can do to help prevent falls. This information is not intended to replace advice given to you by your health care provider. Make sure you discuss any questions you have with your health care provider. Document Released: 03/02/2009 Document Revised: 10/12/2015 Document Reviewed: 06/10/2014 Elsevier Interactive Patient Education  2017 [employer].

## 2018-03-13 ENCOUNTER — Telehealth: Payer: Self-pay | Admitting: Family Medicine

## 2018-03-13 NOTE — Telephone Encounter (Signed)
Community Resource sent to [employer] [facility]. Pt will be coming by [facility] today to pick up Meal Calendar will follow up with pt in one week knb

## 2018-04-10 ENCOUNTER — Encounter: Payer: Self-pay | Admitting: Family Medicine

## 2018-04-10 ENCOUNTER — Ambulatory Visit (INDEPENDENT_AMBULATORY_CARE_PROVIDER_SITE_OTHER): Payer: Medicare HMO | Admitting: Family Medicine

## 2018-04-10 ENCOUNTER — Ambulatory Visit
Admission: RE | Admit: 2018-04-10 | Discharge: 2018-04-10 | Disposition: A | Discharge status: Home / Self Care | Payer: Medicare HMO | Source: Ambulatory Visit | Attending: Family Medicine | Admitting: Family Medicine

## 2018-04-10 ENCOUNTER — Telehealth: Payer: Self-pay | Admitting: Family Medicine

## 2018-04-10 ENCOUNTER — Other Ambulatory Visit: Payer: Self-pay

## 2018-04-10 VITALS — BP 135/73 | HR 54 | Temp 98.1°F | Ht 65.5 in | Wt 206.0 lb

## 2018-04-10 DIAGNOSIS — M25511 Pain in right shoulder: Secondary | ICD-10-CM | POA: Diagnosis not present

## 2018-04-10 DIAGNOSIS — Z0000 Encounter for general adult medical examination without abnormal findings: Secondary | ICD-10-CM

## 2018-04-10 DIAGNOSIS — I2510 Atherosclerotic heart disease of native coronary artery without angina pectoris: Secondary | ICD-10-CM

## 2018-04-10 DIAGNOSIS — M19011 Primary osteoarthritis, right shoulder: Secondary | ICD-10-CM | POA: Insufficient documentation

## 2018-04-10 DIAGNOSIS — J449 Chronic obstructive pulmonary disease, unspecified: Secondary | ICD-10-CM | POA: Diagnosis not present

## 2018-04-10 DIAGNOSIS — E782 Mixed hyperlipidemia: Secondary | ICD-10-CM | POA: Diagnosis not present

## 2018-04-10 DIAGNOSIS — N183 Chronic kidney disease, stage 3 unspecified: Secondary | ICD-10-CM

## 2018-04-10 DIAGNOSIS — I129 Hypertensive chronic kidney disease with stage 1 through stage 4 chronic kidney disease, or unspecified chronic kidney disease: Secondary | ICD-10-CM | POA: Diagnosis not present

## 2018-04-10 DIAGNOSIS — R3911 Hesitancy of micturition: Secondary | ICD-10-CM | POA: Diagnosis not present

## 2018-04-10 DIAGNOSIS — N401 Enlarged prostate with lower urinary tract symptoms: Secondary | ICD-10-CM

## 2018-04-10 LAB — UA/M W/RFLX CULTURE, ROUTINE
Bilirubin, UA: NEGATIVE
Glucose, UA: NEGATIVE
Ketones, UA: NEGATIVE
LEUKOCYTES UA: NEGATIVE
Nitrite, UA: NEGATIVE
PROTEIN UA: NEGATIVE
SPEC GRAV UA: 1.02 (ref 1.005–1.030)
Urobilinogen, Ur: 1 mg/dL (ref 0.2–1.0)
pH, UA: 5.5 (ref 5.0–7.5)

## 2018-04-10 LAB — MICROALBUMIN, URINE WAIVED
CREATININE, URINE WAIVED: 200 mg/dL (ref 10–300)
Microalb, Ur Waived: 30 mg/L — ABNORMAL HIGH (ref 0–19)
Microalb/Creat Ratio: <30 mg/g (ref <30)

## 2018-04-10 MED ORDER — METOPROLOL TARTRATE 25 MG PO TABS: 25.0000 mg | ORAL_TABLET | Freq: Two times a day (BID) | ORAL | 1 refills | Status: DC

## 2018-04-10 MED ORDER — TAMSULOSIN HCL 0.4 MG PO CAPS: 0.4000 mg | ORAL_CAPSULE | Freq: Every day | ORAL | 1 refills | Status: DC

## 2018-04-10 MED ORDER — NAPROXEN 500 MG PO TABS: 500.0000 mg | ORAL_TABLET | Freq: Two times a day (BID) | ORAL | 3 refills | Status: DC

## 2018-04-10 MED ORDER — LISINOPRIL 40 MG PO TABS: 40.0000 mg | ORAL_TABLET | Freq: Every day | ORAL | 1 refills | Status: DC

## 2018-04-10 MED ORDER — SIMVASTATIN 40 MG PO TABS: 40.0000 mg | ORAL_TABLET | Freq: Every day | ORAL | 1 refills | Status: DC

## 2018-04-10 NOTE — Assessment & Plan Note (Signed)
Under good control on current regimen. Continue current regimen. Continue to monitor. Call with any concerns. Refills given. Checking labs today.  

## 2018-04-10 NOTE — Telephone Encounter (Signed)
Called and left patient a VM (signed DPR) letting him know Dr. Durenda Age message. Asked for patient to please call back with any questions or concerns.

## 2018-04-10 NOTE — Telephone Encounter (Signed)
Please let him know that his shoulder shows a little arthritis. I've sent him through some prescription aleve to see if it'll help. Thanks!

## 2018-04-10 NOTE — Progress Notes (Signed)
BP 135/73   Pulse (!) 54   Temp 98.1 F (36.7 C) (Oral)   Ht 5' 5.5" (1.664 m)   Wt 206 lb (93.4 kg)   SpO2 100%   BMI 33.76 kg/m    Subjective:    Patient ID: Mike [name]., male    DOB: 09-04-1955, 62 y.o.   MRN: [id number]  HPI: Mike [name] Cales Mike [name]. is a 62 y.o. male presenting on 04/10/2018 for comprehensive medical examination. Current medical complaints include:  HYPERTENSION / HYPERLIPIDEMIA Satisfied with current treatment? no Duration of hypertension: chronic BP monitoring frequency: not checking BP medication side effects: no Past BP meds: lisinopril and metorprolol Duration of hyperlipidemia: chronic Cholesterol medication side effects: no Cholesterol supplements: none Past cholesterol medications: simvastatin Medication compliance: excellent compliance Aspirin: yes Recent stressors: no Recurrent headaches: no Visual changes: no Palpitations: no Dyspnea: no Chest pain: no Lower extremity edema: no Dizzy/lightheaded: no  BPH BPH status: controlled Satisfied with current treatment?: yes Medication side effects: no Medication compliance: excellent compliance Duration: chronic Nocturia: 1/night Urinary frequency:no Incomplete voiding: no Urgency: no Weak urinary stream: no Straining to start stream: no Dysuria: no Onset: gradual Severity: mild   Interim Problems from his last visit: yes- some shoulder pain  Depression Screen done today and results listed below:  Depression screen [facility] 2/9 04/10/2018 02/25/2018 04/07/2017 02/21/2017 10/01/2016  Decreased Interest 0 0 0 0 0  Down, Depressed, Hopeless 0 0 0 0 0  PHQ - 2 Score 0 0 0 0 0  Altered sleeping 1 - 1 - -  Tired, decreased energy 0 - 0 - -  Change in appetite 0 - 0 - -  Feeling bad or failure about yourself  0 - 0 - -  Trouble concentrating 0 - 0 - -  Moving slowly or fidgety/restless 0 - 0 - -  Suicidal thoughts 0 - 0 - -  PHQ-9 Score 1 - 1 - -  Difficult doing  work/chores Not difficult at all - - - -     Past Medical History:  Past Medical History:  Diagnosis Date  . Arthritis   . Benign enlargement of prostate   . CAD (coronary artery disease)   . Chronic kidney disease   . History of seizures as a child    Due to head injury  . Hyperlipidemia   . Hypertension   . Lumbago   . Myocardial infarction ([place])   . Urinary hesitancy     Surgical History:  Past Surgical History:  Procedure Laterality Date  . coronary artery stent      Medications:  Current Outpatient Medications on File Prior to Visit  Medication Sig  . albuterol (VENTOLIN HFA) 108 (90 Base) MCG/ACT inhaler INHALE 2 PUFFS INTO THE LUNGS EVERY 6  HOURS AS NEEDED FOR WHEEZING OR SHORTNESS OF BREATH.  Marland Kitchen aspirin EC 81 MG tablet Take 81 mg by mouth daily.   Marland Kitchen EPINEPHrine 0.3 mg/0.3 mL IJ SOAJ injection    No current facility-administered medications on file prior to visit.     Allergies:  Allergies  Allergen Reactions  . Shellfish Allergy Anaphylaxis  . Peanuts [Peanut Oil] Rash  . Geralyn Flash [Fish Allergy] Hives    Social History:  Social History   Socioeconomic History  . Marital status: Divorced    Spouse name: Not on file  . Number of children: Not on file  . Years of education: Not on file  . Highest education level: Some college, no degree  Occupational History  . Not on file  Social Needs  . Financial resource strain: Somewhat hard  . Food insecurity:    Worry: Sometimes true    Inability: Never true  . Transportation needs:    Medical: No    Non-medical: Yes  Tobacco Use  . Smoking status: Former Smoker    Packs/day: 1.00    Years: 30.00    Pack years: 30.00    Types: Cigarettes    Last attempt to quit: 04/12/2016    Years since quitting: 1.9  . Smokeless tobacco: Never Used  . Tobacco comment: since 15, max 1 ppd x10  Substance and Sexual Activity  . Alcohol use: No    Alcohol/week: 0.0 standard drinks  . Drug use: No  . Sexual activity:  Yes    Birth control/protection: None  Lifestyle  . Physical activity:    Days per week: 6 days    Minutes per session: 60 min  . Stress: Not at all  Relationships  . Social connections:    Talks on phone: More than three times a week    Gets together: More than three times a week    Attends religious service: Never    Active member of club or organization: Yes    Attends meetings of clubs or organizations: More than 4 times per year    Relationship status: Divorced  . Intimate partner violence:    Fear of current or ex partner: No    Emotionally abused: No    Physically abused: No    Forced sexual activity: No  Other Topics Concern  . Not on file  Social History Narrative   Goes to gym 6-7 days a week    Social History   Tobacco Use  Smoking Status Former Smoker  . Packs/day: 1.00  . Years: 30.00  . Pack years: 30.00  . Types: Cigarettes  . Last attempt to quit: 04/12/2016  . Years since quitting: 1.9  Smokeless Tobacco Never Used  Tobacco Comment   since 15, max 1 ppd x10   Social History   Substance and Sexual Activity  Alcohol Use No  . Alcohol/week: 0.0 standard drinks    Family History:  Family History  Problem Relation Age of Onset  . Hypertension Mother   . Breast cancer Mother   . Hypertension Father   . Stroke Father   . Stroke Maternal Grandfather     Past medical history, surgical history, medications, allergies, family history and social history reviewed with patient today and changes made to appropriate areas of the chart.   Review of Systems  Constitutional: Negative.   HENT: Negative.   Eyes: Negative.   Respiratory: Negative.   Cardiovascular: Negative.   Gastrointestinal: Negative.   Genitourinary: Negative.   Musculoskeletal: Negative.   Skin: Negative.   Neurological: Negative.   Endo/Heme/Allergies: Negative.   Psychiatric/Behavioral: Negative.     All other ROS negative except what is listed above and in the HPI.        Objective:    BP 135/73   Pulse (!) 54   Temp 98.1 F (36.7 C) (Oral)   Ht 5' 5.5" (1.664 m)   Wt 206 lb (93.4 kg)   SpO2 100%   BMI 33.76 kg/m   Wt Readings from Last 3 Encounters:  04/10/18 206 lb (93.4 kg)  02/25/18 200 lb (90.7 kg)  10/06/17 199 lb 1.6 oz (90.3 kg)    Physical Exam  Constitutional: He is oriented to person,  place, and time. He appears well-developed and well-nourished. No distress.  HENT:  Head: Normocephalic and atraumatic.  Right Ear: Hearing, tympanic membrane, external ear and ear canal normal.  Left Ear: Hearing, tympanic membrane, external ear and ear canal normal.  Nose: Nose normal.  Mouth/Throat: Uvula is midline, oropharynx is clear and moist and mucous membranes are normal. No oropharyngeal exudate.  Eyes: Pupils are equal, round, and reactive to light. Conjunctivae, EOM and lids are normal. Right eye exhibits no discharge. Left eye exhibits no discharge. No scleral icterus.  Neck: Normal range of motion. Neck supple. No JVD present. No tracheal deviation present. No thyromegaly present.  Cardiovascular: Normal rate, regular rhythm, normal heart sounds and intact distal pulses. Exam reveals no gallop and no friction rub.  No murmur heard. Pulmonary/Chest: Effort normal and breath sounds normal. No stridor. No respiratory distress. He has no wheezes. He has no rales. He exhibits no tenderness.  Abdominal: Soft. Bowel sounds are normal. He exhibits no distension and no mass. There is no tenderness. There is no rebound and no guarding. No hernia.  Musculoskeletal: Normal range of motion. He exhibits tenderness (AC joint on the R). He exhibits no edema or deformity.  Lymphadenopathy:    He has no cervical adenopathy.  Neurological: He is alert and oriented to person, place, and time. He displays normal reflexes. No cranial nerve deficit or sensory deficit. He exhibits normal muscle tone. Coordination normal.  Skin: Skin is warm, dry and intact.  Capillary refill takes less than 2 seconds. No rash noted. He is not diaphoretic. No erythema. No pallor.  Psychiatric: He has a normal mood and affect. His speech is normal and behavior is normal. Judgment and thought content normal. Cognition and memory are normal.  Nursing note and vitals reviewed.   Results for orders placed or performed in visit on 62/83/15  Basic metabolic panel  Result Value Ref Range   Glucose 80 65 - 99 mg/dL   BUN 19 8 - 27 mg/dL   Creatinine, Ser 1.35 (H) 0.76 - 1.27 mg/dL   GFR calc non Af Amer 55 (L) >59 mL/min/1.73   GFR calc Af Amer 64 >59 mL/min/1.73   BUN/Creatinine Ratio 14 10 - 24   Sodium 140 134 - 144 mmol/L   Potassium 4.3 3.5 - 5.2 mmol/L   Chloride 102 96 - 106 mmol/L   CO2 22 20 - 29 mmol/L   Calcium 9.6 8.6 - 10.2 mg/dL      Assessment & Plan:   Problem List Items Addressed This Visit      Cardiovascular and Mediastinum   CAD (coronary artery disease)    Will keep BP and cholesterol under good control. Continue aspirin. Call with any concerns.       Relevant Medications   lisinopril (PRINIVIL,ZESTRIL) 40 MG tablet   metoprolol tartrate (LOPRESSOR) 25 MG tablet   simvastatin (ZOCOR) 40 MG tablet   Other Relevant Orders   CBC with Differential/Platelet   Comprehensive metabolic panel   TSH   UA/M w/rflx Culture, Routine     Respiratory   COPD (chronic obstructive pulmonary disease) ([place])    Doing well. Lungs clear. Call with any concerns.       Relevant Orders   CBC with Differential/Platelet   Comprehensive metabolic panel   TSH   UA/M w/rflx Culture, Routine     Genitourinary   Benign hypertensive renal disease    Under good control on current regimen. Continue current regimen. Continue to monitor. Call  with any concerns. Refills given. Checking labs today.       Relevant Orders   CBC with Differential/Platelet   Comprehensive metabolic panel   Microalbumin, Urine Waived   TSH   UA/M w/rflx Culture, Routine    CKD (chronic kidney disease), stage III ([facility])    Rechecking levels today. Await results. Call with any concerns.      Relevant Orders   CBC with Differential/Platelet   Comprehensive metabolic panel   Microalbumin, Urine Waived   TSH   UA/M w/rflx Culture, Routine     Other   HLD (hyperlipidemia)    Under good control on current regimen. Continue current regimen. Continue to monitor. Call with any concerns. Refills given. Checking labs today.      Relevant Medications   lisinopril (PRINIVIL,ZESTRIL) 40 MG tablet   metoprolol tartrate (LOPRESSOR) 25 MG tablet   simvastatin (ZOCOR) 40 MG tablet   Other Relevant Orders   CBC with Differential/Platelet   Comprehensive metabolic panel   Lipid Panel w/o Chol/HDL Ratio   TSH   UA/M w/rflx Culture, Routine   Urinary hesitancy due to benign prostatic hyperplasia    Under good control on current regimen. Continue current regimen. Continue to monitor. Call with any concerns. Refills given. Checking labs today.      Relevant Orders   CBC with Differential/Platelet   Comprehensive metabolic panel   PSA   TSH   UA/M w/rflx Culture, Routine    Other Visit Diagnoses    Routine general medical examination at a health care facility    -  Primary   Vaccines up to date. Screening labs checked today. Colonoscopy up to date. Call with any concerns.    Acute pain of right shoulder       Will check x-ray. Await results. Call with any concerns.    Relevant Orders   DG Shoulder Right       Discussed aspirin prophylaxis for myocardial infarction prevention and decision was made to continue ASA  LABORATORY TESTING:  Health maintenance labs ordered today as discussed above.   The natural history of prostate cancer and ongoing controversy regarding screening and potential treatment outcomes of prostate cancer has been discussed with the patient. The meaning of a false positive PSA and a false negative PSA has been discussed. He indicates  understanding of the limitations of this screening test and wishes to proceed with screening PSA testing.   IMMUNIZATIONS:   - Tdap: Tetanus vaccination status reviewed: last tetanus booster within 10 years. - Influenza: Up to date - Pneumovax: Up to date - Prevnar: Not applicable - HPV: Not applicable - Zostavax vaccine: Up to date  SCREENING: - Colonoscopy: Up to date  Discussed with patient purpose of the colonoscopy is to detect colon cancer at curable precancerous or early stages   PATIENT COUNSELING:    Sexuality: Discussed sexually transmitted diseases, partner selection, use of condoms, avoidance of unintended pregnancy  and contraceptive alternatives.   Advised to avoid cigarette smoking.  I discussed with the patient that most people either abstain from alcohol or drink within safe limits (<=14/week and <=4 drinks/occasion for males, <=7/weeks and <= 3 drinks/occasion for females) and that the risk for alcohol disorders and other health effects rises proportionally with the number of drinks per week and how often a drinker exceeds daily limits.  Discussed cessation/primary prevention of drug use and availability of treatment for abuse.   Diet: Encouraged to adjust caloric intake to maintain  or achieve ideal  body weight, to reduce intake of dietary saturated fat and total fat, to limit sodium intake by avoiding high sodium foods and not adding table salt, and to maintain adequate dietary potassium and calcium preferably from fresh fruits, vegetables, and low-fat dairy products.    stressed the importance of regular exercise  Injury prevention: Discussed safety belts, safety helmets, smoke detector, smoking near bedding or upholstery.   Dental health: Discussed importance of regular tooth brushing, flossing, and dental visits.   Follow up plan: NEXT PREVENTATIVE PHYSICAL DUE IN 1 YEAR. Return in about 6 months (around 10/09/2018) for Follow up.

## 2018-04-10 NOTE — Patient Instructions (Addendum)
Lactobacilis and Acidophilus   Health Maintenance, Male A healthy lifestyle and preventive care is important for your health and wellness. Ask your health care provider about what schedule of regular examinations is right for you. What should I know about weight and diet? Eat a Healthy Diet  Eat plenty of vegetables, fruits, whole grains, low-fat dairy products, and lean protein.  Do not eat a lot of foods high in solid fats, added sugars, or salt.  Maintain a Healthy Weight Regular exercise can help you achieve or maintain a healthy weight. You should:  Do at least 150 minutes of exercise each week. The exercise should increase your heart rate and make you sweat (moderate-intensity exercise).  Do strength-training exercises at least twice a week.  Watch Your Levels of Cholesterol and Blood Lipids  Have your blood tested for lipids and cholesterol every 5 years starting at 63 years of age. If you are at high risk for heart disease, you should start having your blood tested when you are 63 years old. You may need to have your cholesterol levels checked more often if: ? Your lipid or cholesterol levels are high. ? You are older than 63 years of age. ? You are at high risk for heart disease.  What should I know about cancer screening? Many types of cancers can be detected early and may often be prevented. Lung Cancer  You should be screened every year for lung cancer if: ? You are a current smoker who has smoked for at least 30 years. ? You are a former smoker who has quit within the past 15 years.  Talk to your health care provider about your screening options, when you should start screening, and how often you should be screened.  Colorectal Cancer  Routine colorectal cancer screening usually begins at 63 years of age and should be repeated every 5-10 years until you are 63 years old. You may need to be screened more often if early forms of precancerous polyps or small growths  are found. Your health care provider may recommend screening at an earlier age if you have risk factors for colon cancer.  Your health care provider may recommend using home test kits to check for hidden blood in the stool.  A small camera at the end of a tube can be used to examine your colon (sigmoidoscopy or colonoscopy). This checks for the earliest forms of colorectal cancer.  Prostate and Testicular Cancer  Depending on your age and overall health, your health care provider may do certain tests to screen for prostate and testicular cancer.  Talk to your health care provider about any symptoms or concerns you have about testicular or prostate cancer.  Skin Cancer  Check your skin from head to toe regularly.  Tell your health care provider about any new moles or changes in moles, especially if: ? There is a change in a mole's size, shape, or color. ? You have a mole that is larger than a pencil eraser.  Always use sunscreen. Apply sunscreen liberally and repeat throughout the day.  Protect yourself by wearing long sleeves, pants, a wide-brimmed hat, and sunglasses when outside.  What should I know about heart disease, diabetes, and high blood pressure?  If you are 33-62 years of age, have your blood pressure checked every 3-5 years. If you are 40 years of age or older, have your blood pressure checked every year. You should have your blood pressure measured twice-once when you are at a hospital  or clinic, and once when you are not at a hospital or clinic. Record the average of the two measurements. To check your blood pressure when you are not at a hospital or clinic, you can use: ? An automated blood pressure machine at a pharmacy. ? A home blood pressure monitor.  Talk to your health care provider about your target blood pressure.  If you are between 29-50 years old, ask your health care provider if you should take aspirin to prevent heart disease.  Have regular diabetes  screenings by checking your fasting blood sugar level. ? If you are at a normal weight and have a low risk for diabetes, have this test once every three years after the age of 35. ? If you are overweight and have a high risk for diabetes, consider being tested at a younger age or more often.  A one-time screening for abdominal aortic aneurysm (AAA) by ultrasound is recommended for men aged 80-75 years who are current or former smokers. What should I know about preventing infection? Hepatitis B If you have a higher risk for hepatitis B, you should be screened for this virus. Talk with your health care provider to find out if you are at risk for hepatitis B infection. Hepatitis C Blood testing is recommended for:  Everyone born from [birth year] through 1966.  Anyone with known risk factors for hepatitis C.  Sexually Transmitted Diseases (STDs)  You should be screened each year for STDs including gonorrhea and chlamydia if: ? You are sexually active and are younger than 63 years of age. ? You are older than 63 years of age and your health care provider tells you that you are at risk for this type of infection. ? Your sexual activity has changed since you were last screened and you are at an increased risk for chlamydia or gonorrhea. Ask your health care provider if you are at risk.  Talk with your health care provider about whether you are at high risk of being infected with HIV. Your health care provider may recommend a prescription medicine to help prevent HIV infection.  What else can I do?  Schedule regular health, dental, and eye exams.  Stay current with your vaccines (immunizations).  Do not use any tobacco products, such as cigarettes, chewing tobacco, and e-cigarettes. If you need help quitting, ask your health care provider.  Limit alcohol intake to no more than 2 drinks per day. One drink equals 12 ounces of beer, 5 ounces of wine, or 1 ounces of hard liquor.  Do not use street  drugs.  Do not share needles.  Ask your health care provider for help if you need support or information about quitting drugs.  Tell your health care provider if you often feel depressed.  Tell your health care provider if you have ever been abused or do not feel safe at home. This information is not intended to replace advice given to you by your health care provider. Make sure you discuss any questions you have with your health care provider. Document Released: 11/02/2007 Document Revised: 01/03/2016 Document Reviewed: 02/07/2015 Elsevier Interactive Patient Education  [employer].

## 2018-04-10 NOTE — Assessment & Plan Note (Signed)
Will keep BP and cholesterol under good control. Continue aspirin. Call with any concerns.

## 2018-04-10 NOTE — Assessment & Plan Note (Signed)
Rechecking levels today. Await results. Call with any concerns.  

## 2018-04-10 NOTE — Assessment & Plan Note (Signed)
Doing well. Lungs clear. Call with any concerns.

## 2018-04-11 LAB — CBC WITH DIFFERENTIAL/PLATELET
BASOS ABS: 0.1 10*3/uL (ref 0.0–0.2)
Basos: 1 %
EOS (ABSOLUTE): 0.5 10*3/uL — ABNORMAL HIGH (ref 0.0–0.4)
EOS: 8 %
HEMATOCRIT: 42.6 % (ref 37.5–51.0)
HEMOGLOBIN: 13.8 g/dL (ref 13.0–17.7)
IMMATURE GRANULOCYTES: 1 %
Immature Grans (Abs): 0 10*3/uL (ref 0.0–0.1)
LYMPHS ABS: 2.3 10*3/uL (ref 0.7–3.1)
Lymphs: 36 %
MCH: 25.1 pg — ABNORMAL LOW (ref 26.6–33.0)
MCHC: 32.4 g/dL (ref 31.5–35.7)
MCV: 78 fL — ABNORMAL LOW (ref 79–97)
Monocytes Absolute: 0.7 10*3/uL (ref 0.1–0.9)
Monocytes: 12 %
NEUTROS PCT: 42 %
Neutrophils Absolute: 2.8 10*3/uL (ref 1.4–7.0)
Platelets: 217 10*3/uL (ref 150–450)
RBC: 5.5 x10E6/uL (ref 4.14–5.80)
RDW: 13 % (ref 12.3–15.4)
WBC: 6.4 10*3/uL (ref 3.4–10.8)

## 2018-04-11 LAB — LIPID PANEL W/O CHOL/HDL RATIO
CHOLESTEROL TOTAL: 155 mg/dL (ref 100–199)
HDL: 58 mg/dL (ref >39)
LDL CALC: 80 mg/dL (ref 0–99)
TRIGLYCERIDES: 86 mg/dL (ref 0–149)
VLDL Cholesterol Cal: 17 mg/dL (ref 5–40)

## 2018-04-11 LAB — COMPREHENSIVE METABOLIC PANEL
ALBUMIN: 4 g/dL (ref 3.6–4.8)
ALK PHOS: 132 IU/L — AB (ref 39–117)
ALT: 30 IU/L (ref 0–44)
AST: 49 IU/L — AB (ref 0–40)
Albumin/Globulin Ratio: 1.6 (ref 1.2–2.2)
BUN/Creatinine Ratio: 13 (ref 10–24)
BUN: 20 mg/dL (ref 8–27)
Bilirubin Total: 0.3 mg/dL (ref 0.0–1.2)
CALCIUM: 9.4 mg/dL (ref 8.6–10.2)
CO2: 24 mmol/L (ref 20–29)
CREATININE: 1.54 mg/dL — AB (ref 0.76–1.27)
Chloride: 101 mmol/L (ref 96–106)
GFR calc Af Amer: 55 mL/min/{1.73_m2} — ABNORMAL LOW (ref >59)
GFR, EST NON AFRICAN AMERICAN: 47 mL/min/{1.73_m2} — AB (ref >59)
GLOBULIN, TOTAL: 2.5 g/dL (ref 1.5–4.5)
GLUCOSE: 94 mg/dL (ref 65–99)
Potassium: 4.6 mmol/L (ref 3.5–5.2)
Sodium: 139 mmol/L (ref 134–144)
Total Protein: 6.5 g/dL (ref 6.0–8.5)

## 2018-04-11 LAB — TSH: TSH: 1.21 u[IU]/mL (ref 0.450–4.500)

## 2018-04-11 LAB — PSA: PROSTATE SPECIFIC AG, SERUM: 0.4 ng/mL (ref 0.0–4.0)

## 2018-04-13 ENCOUNTER — Encounter: Payer: Self-pay | Admitting: Family Medicine

## 2018-04-28 ENCOUNTER — Other Ambulatory Visit: Payer: Self-pay | Admitting: Family Medicine

## 2018-04-30 NOTE — Telephone Encounter (Signed)
[facility] called and spoke to [place], [facility] about the refill request. She says it was received and mailed out on yesterday to the patient and there is 1 refill remaining. Request refused.

## 2018-07-23 ENCOUNTER — Encounter: Payer: Self-pay | Admitting: *Deleted

## 2018-08-01 ENCOUNTER — Telehealth: Payer: Self-pay

## 2018-08-01 NOTE — Telephone Encounter (Signed)
Call pt regarding lung screening. Pt is a former smoker.pt would like scan midmorning. Pt denies any new health issues at this time.

## 2018-08-05 ENCOUNTER — Telehealth: Payer: Self-pay | Admitting: *Deleted

## 2018-08-05 DIAGNOSIS — Z122 Encounter for screening for malignant neoplasm of respiratory organs: Secondary | ICD-10-CM

## 2018-08-05 NOTE — Telephone Encounter (Signed)
Patient has been notified that the annual lung cancer screening low dose CT scan is due currently or will be in the near future.  Confirmed that the patient is within the age range of 24-80, and asymptomatic, and currently exhibits no signs or symptoms of lung cancer.  Patient denies illness that would prevent curative treatment for lung cancer if found.  Verified smoking history, former smoker quit 2017 with [id number] .  The shared decision making visit was completed on 05-07-16.  Patient is agreeable for the CT scan to be scheduled.  Will call patient back with date and time of appointment.

## 2018-08-06 ENCOUNTER — Telehealth: Payer: Self-pay | Admitting: *Deleted

## 2018-08-06 NOTE — Telephone Encounter (Signed)
Patient notified/message left to notify patient that due to current restrictions lung screening appointments are cancelled and patient will be contacted regarding rescheduling.  

## 2018-08-10 ENCOUNTER — Other Ambulatory Visit: Payer: Self-pay | Admitting: Family Medicine

## 2018-09-26 IMAGING — MR MR CERVICAL SPINE W/O CM
5 series · 32 of 48 positions shown · non-contrast
Comparison: Cervical spine radiographs 04/08/2017

ADDENDUM:
Study discussed by telephone with PA [place] Allemant (covering for Dr.
LEESA TAMAY ) on 04/18/2017 at 9793 hours.
CLINICAL DATA: 61-year-old male with posterior neck pain.
Paresthesias in the back and down the left arm. Numbness in the left
thumb and index finger. Symptoms for 3 years.

EXAM:
MRI CERVICAL SPINE WITHOUT CONTRAST
TECHNIQUE: Multiplanar, multisequence MR imaging of the cervical spine was
performed. No intravenous contrast was administered.

[Series 3: T2 · sagittal · 3.0mm · 0.70mm/px · 6 of 13 slices shown (1 of 2)]
[im 1/13]
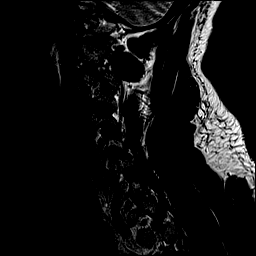
[im 3/13]
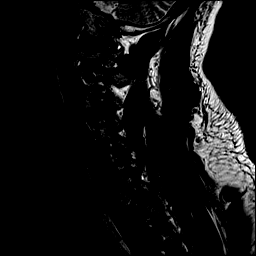
[im 5/13]
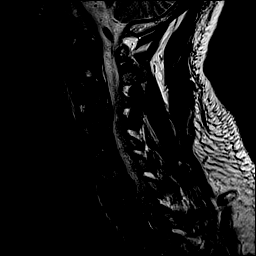
[im 8/13]
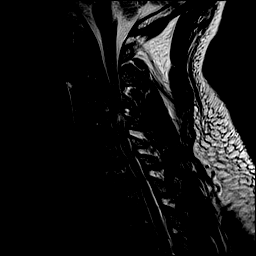
[im 10/13]
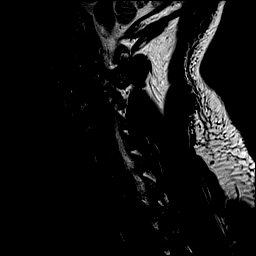
[im 13/13]
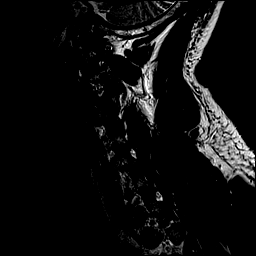

[Series 4: T1 · sagittal · 3.0mm · 0.70mm/px · 7 of 13 slices shown]
[im 1/13]
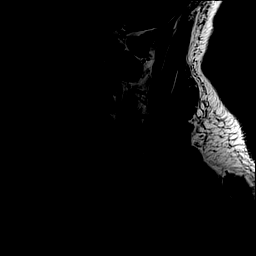
[im 3/13]
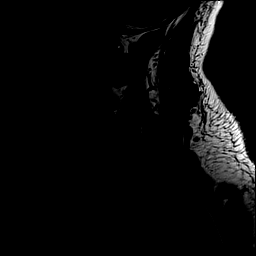
[im 5/13]
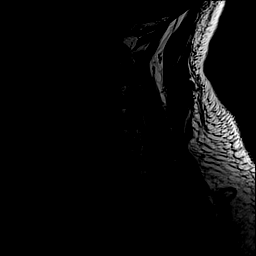
[im 7/13]
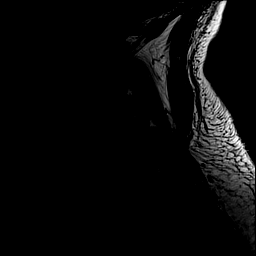
[im 9/13]
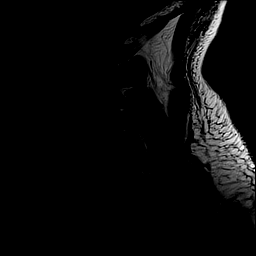
[im 11/13]
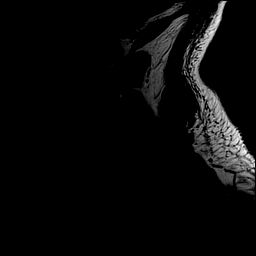
[im 13/13]
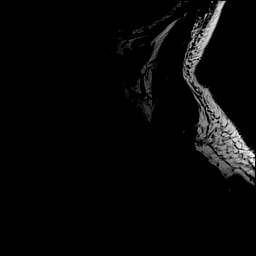

[Series 5: STIR · sagittal · 3.0mm · 0.35mm/px · 7 of 13 slices shown]
[im 1/13]
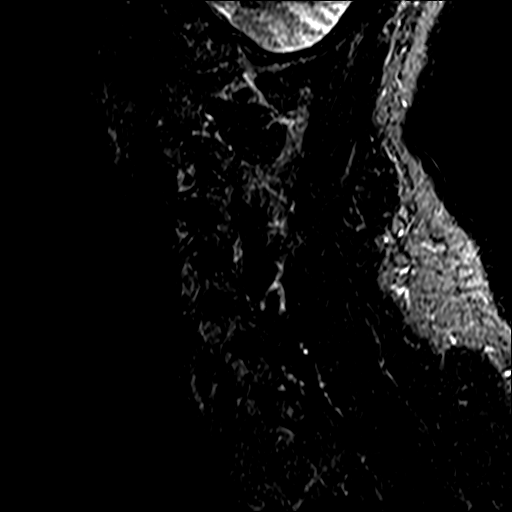
[im 3/13]
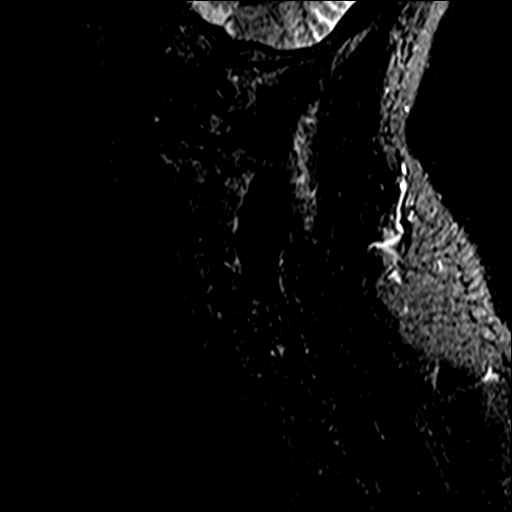
[im 5/13]
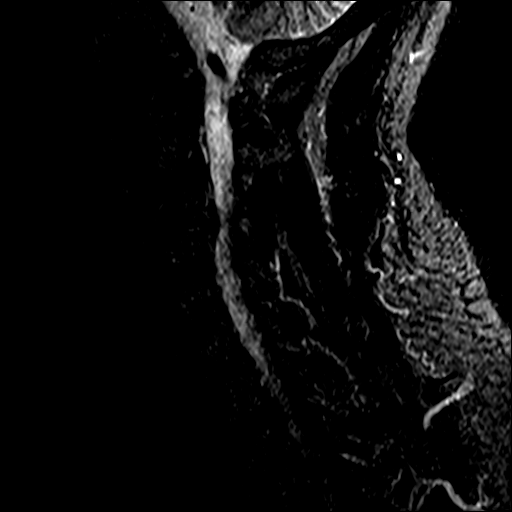
[im 7/13]
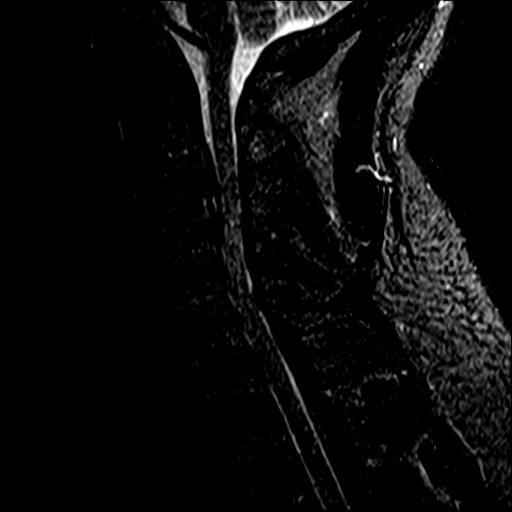
[im 9/13]
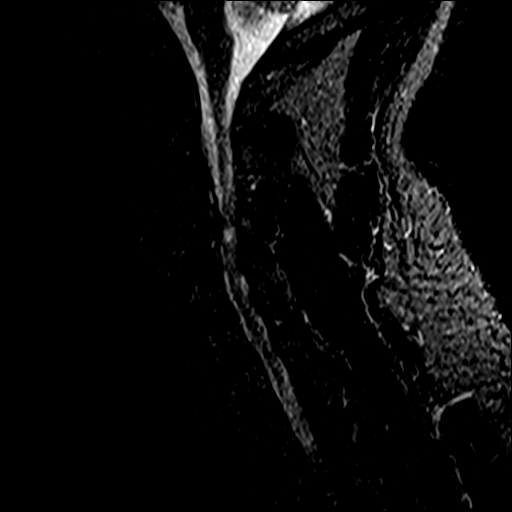
[im 11/13]
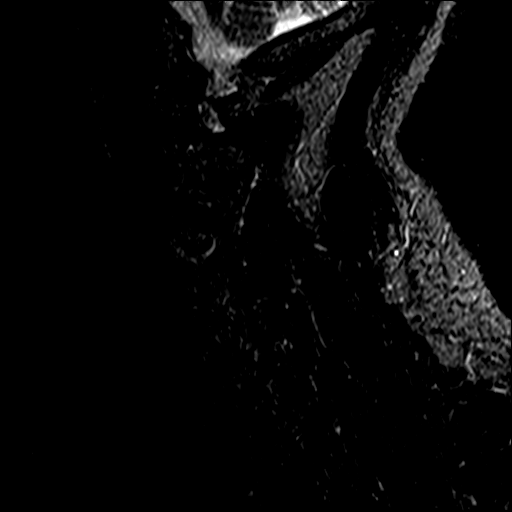
[im 13/13]
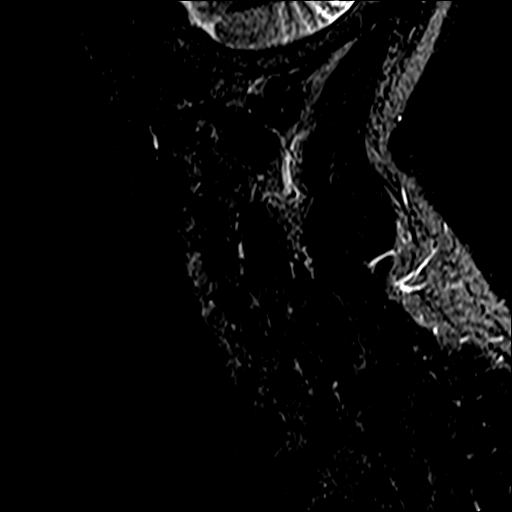

[Series 6: T2 · axial · 3.0mm · 0.70mm/px · z∈[-56,+40]mm · 8 of 27 slices shown (2 of 2)]
[im 1/27]
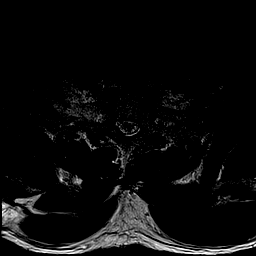
[im 5/27]
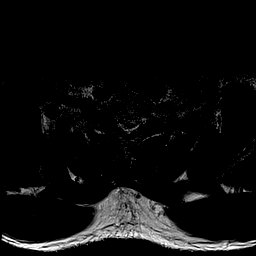
[im 9/27]
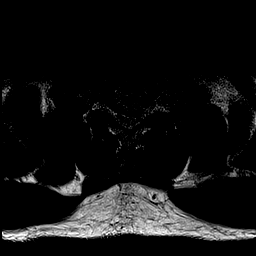
[im 13/27]
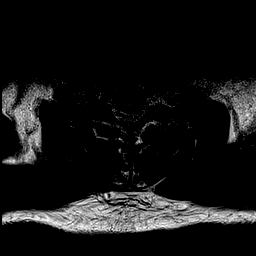
[im 15/27]
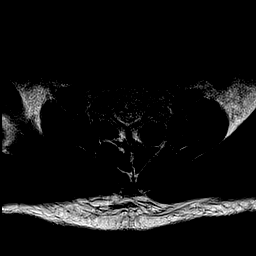
[im 19/27]
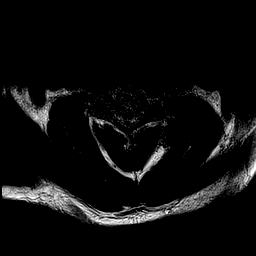
[im 23/27]
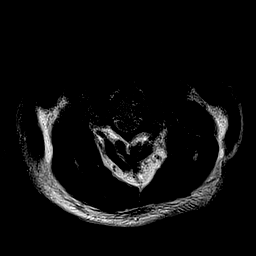
[im 27/27]
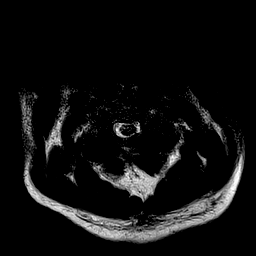

[Series 7: mpgr ax · axial · 3.0mm · 0.35mm/px · z∈[-56,-11]mm · 4 of 27 slices shown]
[im 1/27]
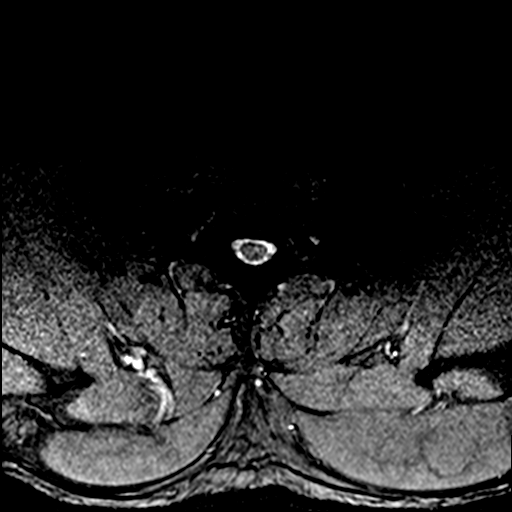
[im 5/27]
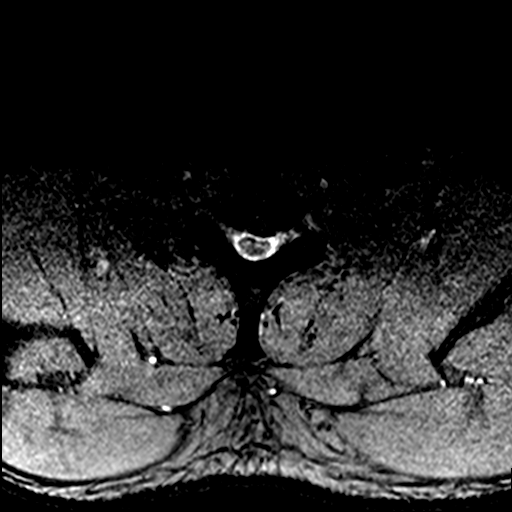
[im 9/27]
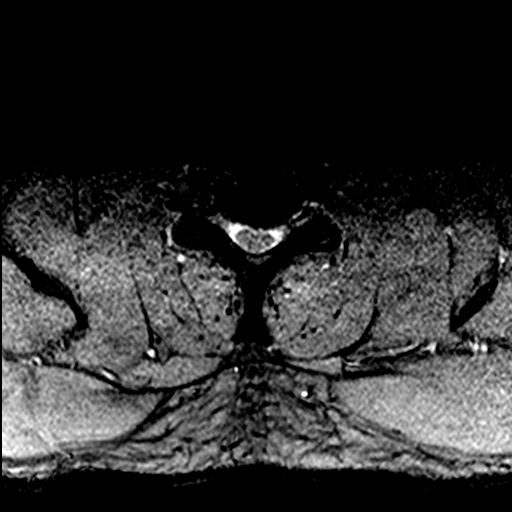
[im 13/27]
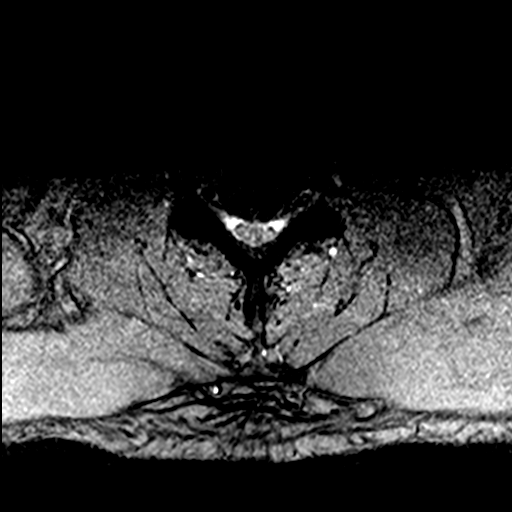

[32 of 48 positions shown; findings below may reference images not displayed]

FINDINGS: Alignment: Straightening of cervical lordosis. Subtle retrolisthesis
of C3 on C4.

Vertebrae: Degenerative endplate marrow signal changes with no
marrow edema or evidence of acute osseous abnormality.

Cord: Abnormal spinal cord signal with increased T2 and STIR within
the substance of the cord at C3-C4 (series 3, image 8 and series 6,
image 8). Note associated degenerative cord compression at that
level detailed below. Multilevel cervical spinal stenosis elsewhere
but no other abnormal spinal cord signal identified.

Posterior Fossa, vertebral arteries, paraspinal tissues:
Cervicomedullary junction is within normal limits. Negative visible
posterior fossa. The right vertebral artery appears dominant with a
preserved flow void. The left vertebral appears highly diminutive,
largely non visible.

Disc levels:

C2-C3: Circumferential disc osteophyte complex with broad-based left
paracentral posterior component. Mild to moderate facet hypertrophy.
Ligament flavum hypertrophy. Spinal stenosis with mild left hemi
cord mass effect. Moderate left and mild right C3 foraminal
stenosis.

C3-C4: Disc space loss with bulky circumferential disc osteophyte
complex. Broad-based posterior component with mild to moderate facet
and ligament flavum hypertrophy. Spinal stenosis with mild to
moderate spinal cord mass effect, and abnormal cord signal as stated
earlier. Severe bilateral C4 foraminal stenosis.

C4-C5: Circumferential disc osteophyte complex with broad-based
posterior component. Mild to moderate facet hypertrophy greater on
the left. Spinal stenosis with mild spinal cord mass effect (series
7, image 11). Mild to moderate bilateral C5 foraminal stenosis.

C5-C6: Circumferential disc osteophyte complex with superimposed
broad-based central disc protrusion (series 7, image 16). Mild to
moderate facet and moderate ligament flavum hypertrophy. Spinal
stenosis with mild spinal cord mass effect. Mild bilateral C6
foraminal stenosis.

C6-C7: Circumferential disc osteophyte complex with broad-based left
paracentral posterior component. Spinal stenosis with mild spinal
cord mass effect. Mild facet hypertrophy and mild bilateral C7
foraminal stenosis.

C7-T1: Mild disc bulge and endplate spurring. Mild left and moderate
to severe right facet hypertrophy. No spinal stenosis but moderate
to severe right C8 foraminal stenosis (series 3, image 4).

T1-T2: No upper thoracic spinal stenosis identified. There is mild
to moderate facet hypertrophy at this level with mild to moderate
bilateral T1 foraminal stenosis.
IMPRESSION: 1. Advanced cervical spine degeneration with diffuse cervical spinal
stenosis, sparing only the C7-T1 level.
2. Associated spinal cord mass effect worst at C3-C4 (Moderate)
where abnormal cord signal suggests spinal cord myelomalacia related
to chronic compressive myelopathy. No other cord signal abnormality.
3. Moderate or severe neural foraminal stenosis at the left C3,
bilateral C4 and right C8 nerve levels.
4.  No acute osseous abnormality identified.

## 2018-09-28 ENCOUNTER — Telehealth: Payer: Self-pay | Admitting: *Deleted

## 2018-09-28 NOTE — Telephone Encounter (Signed)
Patient has been notified that annual lung cancer screening low dose CT scan is due currently or will be in near future. Confirmed that patient is within the age range of 55-77, and asymptomatic, (no signs or symptoms of lung cancer). Patient denies illness that would prevent curative treatment for lung cancer if found. Verified smoking history, (former, quit 03/2016, 30 pack year). The shared decision making visit was done 05/07/16. Patient is agreeable for CT scan being scheduled.

## 2018-10-01 ENCOUNTER — Other Ambulatory Visit: Payer: Self-pay

## 2018-10-01 ENCOUNTER — Ambulatory Visit
Admission: RE | Admit: 2018-10-01 | Discharge: 2018-10-01 | Disposition: A | Discharge status: Home / Self Care | Payer: Medicare HMO | Source: Ambulatory Visit | Attending: Nurse Practitioner | Admitting: Nurse Practitioner

## 2018-10-01 DIAGNOSIS — I700 Atherosclerosis of aorta: Secondary | ICD-10-CM | POA: Diagnosis not present

## 2018-10-01 DIAGNOSIS — I2510 Atherosclerotic heart disease of native coronary artery without angina pectoris: Secondary | ICD-10-CM | POA: Diagnosis not present

## 2018-10-01 DIAGNOSIS — J439 Emphysema, unspecified: Secondary | ICD-10-CM | POA: Diagnosis not present

## 2018-10-01 DIAGNOSIS — Z87891 Personal history of nicotine dependence: Secondary | ICD-10-CM | POA: Insufficient documentation

## 2018-10-01 DIAGNOSIS — Z122 Encounter for screening for malignant neoplasm of respiratory organs: Secondary | ICD-10-CM | POA: Diagnosis not present

## 2018-10-06 ENCOUNTER — Encounter: Payer: Self-pay | Admitting: *Deleted

## 2019-01-12 ENCOUNTER — Telehealth: Payer: Self-pay | Admitting: Family Medicine

## 2019-01-12 NOTE — Chronic Care Management (AMB) (Signed)
°  Chronic Care Management   Outreach Note  01/12/2019 Name: Mike [name] [facility]. MRN: [id number] DOB: 10-26-1955  Referred by: Valerie Roys, DO Reason for referral : Chronic Care Management (Initial CCM outreach was unsuccessful. )   An unsuccessful telephone outreach was attempted today. The patient was referred to the case management team by for assistance with chronic care management and care coordination.   Follow Up Plan: A HIPPA compliant phone message was left for the patient providing contact information and requesting a return call.  The care management team will reach out to the patient again over the next 7 days.  If patient returns call to provider office, please advise to call [place] at [place]  [email or web address]   ??[phone]

## 2019-01-18 NOTE — Chronic Care Management (AMB) (Signed)
°  Chronic Care Management   Outreach Note  01/18/2019 Name: Mike [name] [facility]. MRN: [id number] DOB: [birth year]  Referred by: Valerie Roys, DO Reason for referral : Chronic Care Management (Initial CCM outreach was unsuccessful. ) and Chronic Care Management (Second CCM outreach was unsuccessful.)   A second unsuccessful telephone outreach was attempted today. The patient was referred to the case management team for assistance with chronic care management and care coordination.   Follow Up Plan: The care management team will reach out to the patient again over the next 7 days.   [place]  [email or web address]   ??[phone]

## 2019-01-20 ENCOUNTER — Other Ambulatory Visit: Payer: Self-pay | Admitting: Family Medicine

## 2019-01-20 ENCOUNTER — Encounter: Payer: Self-pay | Admitting: Family Medicine

## 2019-01-20 NOTE — Telephone Encounter (Signed)
Pt request refill  lisinopril (PRINIVIL,ZESTRIL) 40 MG tablet simvastatin (ZOCOR) 40 MG tablet tamsulosin (FLOMAX) 0.4 MG CAPS capsule  90 days  [name], Mike [phone] (Phone) [phone] (Fax)

## 2019-01-20 NOTE — Telephone Encounter (Signed)
Due for a follow up.

## 2019-01-20 NOTE — Telephone Encounter (Signed)
Requested medication (s) are due for refill today: yes  Requested medication (s) are on the active medication list: yes  Last refill:  04/08/2019  Future visit scheduled: no Notes to clinic: review for refill   Requested Prescriptions  Pending Prescriptions Disp Refills   lisinopril (ZESTRIL) 40 MG tablet 90 tablet 1    Sig: Take 1 tablet (40 mg total) by mouth daily.     Cardiovascular:  ACE Inhibitors Failed - 01/20/2019 10:36 AM      Failed - Cr in normal range and within 180 days    Creatinine, Ser  Date Value Ref Range Status  04/10/2018 1.54 (H) 0.76 - 1.27 mg/dL Final         Failed - K in normal range and within 180 days    Potassium  Date Value Ref Range Status  04/10/2018 4.6 3.5 - 5.2 mmol/L Final         Failed - Valid encounter within last 6 months    Recent Outpatient Visits          9 months ago Routine general medical examination at a health care facility   [facility], [place], DO   1 year ago Benign hypertensive renal disease   [place], [place], DO   1 year ago Routine general medical examination at a health care facility   [facility], [place], DO   2 years ago Benign hypertensive renal disease   [place], [place], DO   2 years ago Tobacco abuse   [place], [place], DO      Future Appointments            In 1 month [place], [place] - Patient is not pregnant      Passed - Last BP in normal range    BP Readings from Last 1 Encounters:  04/10/18 135/73          simvastatin (ZOCOR) 40 MG tablet 90 tablet 1    Sig: Take 1 tablet (40 mg total) by mouth daily.     Cardiovascular:  Antilipid - Statins Passed - 01/20/2019 10:36 AM      Passed - Total Cholesterol in normal range and within 360 days    Cholesterol, Total  Date Value Ref Range Status  04/10/2018 155 100 - 199 mg/dL Final   Cholesterol  Piccolo, Waived  Date Value Ref Range Status  04/02/2016 142 <200 mg/dL Final    Comment:                            Desirable                <200                         Borderline High      200- 239                         High                     >239          Passed - LDL in normal range and within 360 days    LDL Calculated  Date Value Ref Range Status  04/10/2018 80 0 - 99 mg/dL Final         Passed - HDL in normal range and within 360 days    HDL  Date Value Ref Range Status  04/10/2018 58 >39 mg/dL Final         Passed - Triglycerides in normal range and within 360 days    Triglycerides  Date Value Ref Range Status  04/10/2018 86 0 - 149 mg/dL Final   Triglycerides Piccolo,Waived  Date Value Ref Range Status  04/02/2016 53 <150 mg/dL Final    Comment:                            Normal                   <150                         Borderline High     150 - 199                         High                200 - 499                         Very High                >499          Passed - Patient is not pregnant      Passed - Valid encounter within last 12 months    Recent Outpatient Visits          9 months ago Routine general medical examination at a health care facility   [facility], [place], DO   1 year ago Benign hypertensive renal disease   [place], [place], DO   1 year ago Routine general medical examination at a health care facility   [facility], [place], DO   2 years ago Benign hypertensive renal disease   [place], [place], DO   2 years ago Tobacco abuse   [facility] [place], [place], DO      Future Appointments            In 1 month [place], [facility]             tamsulosin (FLOMAX) 0.4 MG CAPS capsule 90 capsule 1    Sig: Take 1 capsule (0.4 mg total) by mouth daily.     Urology: Alpha-Adrenergic Blocker Passed -  01/20/2019 10:36 AM      Passed - Last BP in normal range    BP Readings from Last 1 Encounters:  04/10/18 135/73         Passed - Valid encounter within last 12 months    Recent Outpatient Visits          9 months ago Routine general medical examination at a health care facility   [facility], [place], DO   1 year ago Benign hypertensive renal disease   [place], [place], DO   1 year ago Routine general medical examination at a health care facility   [facility], [place], DO   2 years ago Benign hypertensive renal disease  [place], [place], DO   2 years ago Tobacco abuse   [place], [place], DO      Future Appointments            In 1 month [place], [facility]

## 2019-01-20 NOTE — Telephone Encounter (Signed)
Routing to provider  

## 2019-01-20 NOTE — Chronic Care Management (AMB) (Signed)
°  Chronic Care Management   Outreach Note  01/20/2019 Name: Mike [name] [facility] MRN: [id number] DOB: 10/04/1955  Referred by: Valerie Roys, DO Reason for referral : Chronic Care Management (Initial CCM outreach was unsuccessful. ), Chronic Care Management (Second CCM outreach was unsuccessful.), and Chronic Care Management (Third CCM outreach was unsuccessful.)   Third unsuccessful telephone outreach was attempted today. The patient was referred to the case management team for assistance with chronic care management and care coordination. The patient's primary care provider has been notified of our unsuccessful attempts to make or maintain contact with the patient. The care management team is pleased to engage with this patient at any time in the future should he/she be interested in assistance from the care management team.   Follow Up Plan: The care management team is available to follow up with the patient after provider conversation with the patient regarding recommendation for care management engagement and subsequent re-referral to the care management team.   [place]  [email or web address]   ??[phone]

## 2019-01-20 NOTE — Telephone Encounter (Signed)
Called pt tried to inform him that a fu appt is needed for refill, he answered said hello twice and ended call two times. Sending letter via mail.

## 2019-01-21 ENCOUNTER — Other Ambulatory Visit: Payer: Self-pay

## 2019-01-21 ENCOUNTER — Ambulatory Visit: Payer: Medicare HMO | Admitting: Family Medicine

## 2019-01-21 MED ORDER — SIMVASTATIN 40 MG PO TABS: 40.0000 mg | ORAL_TABLET | Freq: Every day | ORAL | 1 refills | Status: DC

## 2019-01-21 MED ORDER — TAMSULOSIN HCL 0.4 MG PO CAPS: 0.4000 mg | ORAL_CAPSULE | Freq: Every day | ORAL | 1 refills | Status: DC

## 2019-01-21 MED ORDER — LISINOPRIL 40 MG PO TABS: 40.0000 mg | ORAL_TABLET | Freq: Every day | ORAL | 1 refills | Status: DC

## 2019-01-21 NOTE — Telephone Encounter (Signed)
Patient had to be rescheduled due to provider being out. Patient needs refills to get to appointment on 02/02/19.

## 2019-01-21 NOTE — Telephone Encounter (Signed)
Called pt to let him know prescriptions have been sent to the pharmacy, no answer, left vm

## 2019-01-21 NOTE — Telephone Encounter (Signed)
Appt scheduled

## 2019-02-02 ENCOUNTER — Encounter: Payer: Self-pay | Admitting: Family Medicine

## 2019-02-02 ENCOUNTER — Ambulatory Visit (INDEPENDENT_AMBULATORY_CARE_PROVIDER_SITE_OTHER): Payer: Medicare HMO | Admitting: Family Medicine

## 2019-02-02 ENCOUNTER — Other Ambulatory Visit: Payer: Self-pay

## 2019-02-02 VITALS — BP 128/77 | HR 59 | Temp 98.8°F | Ht 65.0 in | Wt 213.0 lb

## 2019-02-02 DIAGNOSIS — J449 Chronic obstructive pulmonary disease, unspecified: Secondary | ICD-10-CM

## 2019-02-02 DIAGNOSIS — Z23 Encounter for immunization: Secondary | ICD-10-CM

## 2019-02-02 DIAGNOSIS — E782 Mixed hyperlipidemia: Secondary | ICD-10-CM

## 2019-02-02 DIAGNOSIS — N183 Chronic kidney disease, stage 3 unspecified: Secondary | ICD-10-CM

## 2019-02-02 DIAGNOSIS — I129 Hypertensive chronic kidney disease with stage 1 through stage 4 chronic kidney disease, or unspecified chronic kidney disease: Secondary | ICD-10-CM | POA: Diagnosis not present

## 2019-02-02 MED ORDER — METOPROLOL TARTRATE 25 MG PO TABS: 25.0000 mg | ORAL_TABLET | Freq: Two times a day (BID) | ORAL | 1 refills | Status: DC

## 2019-02-02 MED ORDER — LISINOPRIL 40 MG PO TABS: 40.0000 mg | ORAL_TABLET | Freq: Every day | ORAL | 1 refills | Status: DC

## 2019-02-02 MED ORDER — SIMVASTATIN 40 MG PO TABS: 40.0000 mg | ORAL_TABLET | Freq: Every day | ORAL | 1 refills | Status: DC

## 2019-02-02 MED ORDER — ALBUTEROL SULFATE HFA 108 (90 BASE) MCG/ACT IN AERS: INHALATION_SPRAY | RESPIRATORY_TRACT | 6 refills | Status: DC

## 2019-02-02 MED ORDER — NAPROXEN 500 MG PO TABS: 500.0000 mg | ORAL_TABLET | Freq: Two times a day (BID) | ORAL | 3 refills | Status: DC

## 2019-02-02 MED ORDER — TAMSULOSIN HCL 0.4 MG PO CAPS: 0.4000 mg | ORAL_CAPSULE | Freq: Every day | ORAL | 1 refills | Status: DC

## 2019-02-02 NOTE — Assessment & Plan Note (Signed)
Under good control on current regimen. Continue current regimen. Continue to monitor. Call with any concerns. Refills given. Labs drawn today.   

## 2019-02-02 NOTE — Progress Notes (Signed)
BP 128/77   Pulse (!) 59   Temp 98.8 F (37.1 C) (Oral)   Ht 5\' 5"  (1.651 m)   Wt 213 lb (96.6 kg)   SpO2 99%   BMI 35.45 kg/m    Subjective:    Patient ID: Mike [name]., male    DOB: Feb 07, 1956, 63 y.o.   MRN: [id number]  HPI: Maclin Crookshanks Brenner [name] is a 63 y.o. male  Chief Complaint  Patient presents with  . Hypertension  . Hyperlipidemia   HYPERTENSION / HYPERLIPIDEMIA Satisfied with current treatment? yes Duration of hypertension: chronic BP monitoring frequency: not checking BP medication side effects: no Past BP meds: lisinopril, metoprolol Duration of hyperlipidemia: chronic Cholesterol medication side effects: no Cholesterol supplements: none Past cholesterol medications: simvastatin Medication compliance: excellent compliance Aspirin: yes Recent stressors: no Recurrent headaches: no Visual changes: no Palpitations: no Dyspnea: no Chest pain: no Lower extremity edema: no Dizzy/lightheaded: no  COPD COPD status: controlled Satisfied with current treatment?: yes Oxygen use: no Dyspnea frequency: Occasionally Cough frequency: Rarely Rescue inhaler frequency:   Limitation of activity: no Productive cough: No Pneumovax: Up to Date Influenza: Up to Date  Relevant past medical, surgical, family and social history reviewed and updated as indicated. Interim medical history since our last visit reviewed. Allergies and medications reviewed and updated.  Review of Systems  Constitutional: Negative.   Respiratory: Negative.   Cardiovascular: Negative.   Musculoskeletal: Negative.   Psychiatric/Behavioral: Negative.     Per HPI unless specifically indicated above     Objective:    BP 128/77   Pulse (!) 59   Temp 98.8 F (37.1 C) (Oral)   Ht 5\' 5"  (1.651 m)   Wt 213 lb (96.6 kg)   SpO2 99%   BMI 35.45 kg/m   Wt Readings from Last 3 Encounters:  02/02/19 213 lb (96.6 kg)  10/01/18 205 lb (93 kg)  04/10/18 206 lb (93.4 kg)    Physical Exam Vitals signs and nursing note reviewed.  Constitutional:      General: He is not in acute distress.    Appearance: Normal appearance. He is not ill-appearing, toxic-appearing or diaphoretic.  HENT:     Head: Normocephalic and atraumatic.     Right Ear: External ear normal.     Left Ear: External ear normal.     Nose: Nose normal.     Mouth/Throat:     Mouth: Mucous membranes are moist.     Pharynx: Oropharynx is clear.  Eyes:     General: No scleral icterus.       Right eye: No discharge.        Left eye: No discharge.     Extraocular Movements: Extraocular movements intact.     Conjunctiva/sclera: Conjunctivae normal.     Pupils: Pupils are equal, round, and reactive to light.  Neck:     Musculoskeletal: Normal range of motion and neck supple.  Cardiovascular:     Rate and Rhythm: Normal rate and regular rhythm.     Pulses: Normal pulses.     Heart sounds: Normal heart sounds. No murmur. No friction rub. No gallop.   Pulmonary:     Effort: Pulmonary effort is normal. No respiratory distress.     Breath sounds: Normal breath sounds. No stridor. No wheezing, rhonchi or rales.  Chest:     Chest wall: No tenderness.  Musculoskeletal: Normal range of motion.  Skin:    General: Skin is warm and dry.  Capillary Refill: Capillary refill takes less than 2 seconds.     Coloration: Skin is not jaundiced or pale.     Findings: No bruising, erythema, lesion or rash.  Neurological:     General: No focal deficit present.     Mental Status: He is alert and oriented to person, place, and time. Mental status is at baseline.  Psychiatric:        Mood and Affect: Mood normal.        Behavior: Behavior normal.        Thought Content: Thought content normal.        Judgment: Judgment normal.     Results for orders placed or performed in visit on 04/10/18  CBC with Differential/Platelet  Result Value Ref Range   WBC 6.4 3.4 - 10.8 x10E3/uL   RBC 5.50 4.14 - 5.80  x10E6/uL   Hemoglobin 13.8 13.0 - 17.7 g/dL   Hematocrit 42.6 37.5 - 51.0 %   MCV 78 (L) 79 - 97 fL   MCH 25.1 (L) 26.6 - 33.0 pg   MCHC 32.4 31.5 - 35.7 g/dL   RDW 13.0 12.3 - 15.4 %   Platelets 217 150 - 450 x10E3/uL   Neutrophils 42 Not Estab. %   Lymphs 36 Not Estab. %   Monocytes 12 Not Estab. %   Eos 8 Not Estab. %   Basos 1 Not Estab. %   Neutrophils Absolute 2.8 1.4 - 7.0 x10E3/uL   Lymphocytes Absolute 2.3 0.7 - 3.1 x10E3/uL   Monocytes Absolute 0.7 0.1 - 0.9 x10E3/uL   EOS (ABSOLUTE) 0.5 (H) 0.0 - 0.4 x10E3/uL   Basophils Absolute 0.1 0.0 - 0.2 x10E3/uL   Immature Granulocytes 1 Not Estab. %   Immature Grans (Abs) 0.0 0.0 - 0.1 x10E3/uL  Comprehensive metabolic panel  Result Value Ref Range   Glucose 94 65 - 99 mg/dL   BUN 20 8 - 27 mg/dL   Creatinine, Ser 1.54 (H) 0.76 - 1.27 mg/dL   GFR calc non Af Amer 47 (L) >59 mL/min/1.73   GFR calc Af Amer 55 (L) >59 mL/min/1.73   BUN/Creatinine Ratio 13 10 - 24   Sodium 139 134 - 144 mmol/L   Potassium 4.6 3.5 - 5.2 mmol/L   Chloride 101 96 - 106 mmol/L   CO2 24 20 - 29 mmol/L   Calcium 9.4 8.6 - 10.2 mg/dL   Total Protein 6.5 6.0 - 8.5 g/dL   Albumin 4.0 3.6 - 4.8 g/dL   Globulin, Total 2.5 1.5 - 4.5 g/dL   Albumin/Globulin Ratio 1.6 1.2 - 2.2   Bilirubin Total 0.3 0.0 - 1.2 mg/dL   Alkaline Phosphatase 132 (H) 39 - 117 IU/L   AST 49 (H) 0 - 40 IU/L   ALT 30 0 - 44 IU/L  Lipid Panel w/o Chol/HDL Ratio  Result Value Ref Range   Cholesterol, Total 155 100 - 199 mg/dL   Triglycerides 86 0 - 149 mg/dL   HDL 58 >39 mg/dL   VLDL Cholesterol Cal 17 5 - 40 mg/dL   LDL Calculated 80 0 - 99 mg/dL  Microalbumin, Urine Waived  Result Value Ref Range   Microalb, Ur Waived 30 (H) 0 - 19 mg/L   Creatinine, Urine Waived 200 10 - 300 mg/dL   Microalb/Creat Ratio <30 <30 mg/g  PSA  Result Value Ref Range   Prostate Specific Ag, Serum 0.4 0.0 - 4.0 ng/mL  TSH  Result Value Ref Range   TSH 1.210 0.450 -  4.500 uIU/mL  UA/M  w/rflx Culture, Routine   Specimen: Urine   URINE  Result Value Ref Range   Specific Gravity, UA 1.020 1.005 - 1.030   pH, UA 5.5 5.0 - 7.5   Color, UA Yellow Yellow   Appearance Ur Clear Clear   Leukocytes, UA Negative Negative   Protein, UA Negative Negative/Trace   Glucose, UA Negative Negative   Ketones, UA Negative Negative   RBC, UA Trace (A) Negative   Bilirubin, UA Negative Negative   Urobilinogen, Ur 1.0 0.2 - 1.0 mg/dL   Nitrite, UA Negative Negative      Assessment & Plan:   Problem List Items Addressed This Visit      Respiratory   COPD (chronic obstructive pulmonary disease) ([place])    Under good control on current regimen. Continue current regimen. Continue to monitor. Call with any concerns. Refills given. Labs drawn today.        Relevant Medications   albuterol (VENTOLIN HFA) 108 (90 Base) MCG/ACT inhaler     Genitourinary   Benign hypertensive renal disease - Primary    Under good control on current regimen. Continue current regimen. Continue to monitor. Call with any concerns. Refills given. Labs drawn today.        Relevant Orders   Comprehensive metabolic panel   CKD (chronic kidney disease), stage III ([place])    Under good control on current regimen. Continue current regimen. Continue to monitor. Call with any concerns. Refills given. Labs drawn today.        Relevant Orders   Comprehensive metabolic panel     Other   HLD (hyperlipidemia)    Under good control on current regimen. Continue current regimen. Continue to monitor. Call with any concerns. Refills given. Labs drawn today.        Relevant Medications   lisinopril (ZESTRIL) 40 MG tablet   metoprolol tartrate (LOPRESSOR) 25 MG tablet   simvastatin (ZOCOR) 40 MG tablet   Other Relevant Orders   Comprehensive metabolic panel   Lipid Panel w/o Chol/HDL Ratio    Other Visit Diagnoses    Flu vaccine need       Flu shot given today.   Relevant Orders   Flu Vaccine QUAD 36+ mos IM  (Completed)       Follow up plan: Return in about 6 months (around 08/02/2019) for Wellness/physical.

## 2019-02-03 ENCOUNTER — Encounter: Payer: Self-pay | Admitting: Family Medicine

## 2019-02-03 LAB — COMPREHENSIVE METABOLIC PANEL
ALT: 16 IU/L (ref 0–44)
AST: 31 IU/L (ref 0–40)
Albumin/Globulin Ratio: 1.6 (ref 1.2–2.2)
Albumin: 3.9 g/dL (ref 3.8–4.8)
Alkaline Phosphatase: 120 IU/L — ABNORMAL HIGH (ref 39–117)
BUN/Creatinine Ratio: 13 (ref 10–24)
BUN: 23 mg/dL (ref 8–27)
Bilirubin Total: 0.4 mg/dL (ref 0.0–1.2)
CO2: 22 mmol/L (ref 20–29)
Calcium: 9 mg/dL (ref 8.6–10.2)
Chloride: 105 mmol/L (ref 96–106)
Creatinine, Ser: 1.74 mg/dL — ABNORMAL HIGH (ref 0.76–1.27)
GFR calc Af Amer: 47 mL/min/{1.73_m2} — ABNORMAL LOW (ref >59)
GFR calc non Af Amer: 41 mL/min/{1.73_m2} — ABNORMAL LOW (ref >59)
Globulin, Total: 2.5 g/dL (ref 1.5–4.5)
Glucose: 102 mg/dL — ABNORMAL HIGH (ref 65–99)
Potassium: 5 mmol/L (ref 3.5–5.2)
Sodium: 141 mmol/L (ref 134–144)
Total Protein: 6.4 g/dL (ref 6.0–8.5)

## 2019-02-03 LAB — LIPID PANEL W/O CHOL/HDL RATIO
Cholesterol, Total: 139 mg/dL (ref 100–199)
HDL: 52 mg/dL (ref >39)
LDL Chol Calc (NIH): 75 mg/dL (ref 0–99)
Triglycerides: 55 mg/dL (ref 0–149)
VLDL Cholesterol Cal: 12 mg/dL (ref 5–40)

## 2019-03-01 ENCOUNTER — Ambulatory Visit: Payer: Medicare HMO

## 2019-08-12 ENCOUNTER — Ambulatory Visit (INDEPENDENT_AMBULATORY_CARE_PROVIDER_SITE_OTHER): Payer: Medicare HMO | Admitting: Family Medicine

## 2019-08-12 ENCOUNTER — Encounter: Payer: Self-pay | Admitting: Family Medicine

## 2019-08-12 ENCOUNTER — Ambulatory Visit (INDEPENDENT_AMBULATORY_CARE_PROVIDER_SITE_OTHER): Payer: Medicare HMO

## 2019-08-12 ENCOUNTER — Other Ambulatory Visit: Payer: Self-pay

## 2019-08-12 VITALS — BP 133/73 | HR 48 | Temp 97.4°F | Wt 209.0 lb

## 2019-08-12 DIAGNOSIS — Z1211 Encounter for screening for malignant neoplasm of colon: Secondary | ICD-10-CM

## 2019-08-12 DIAGNOSIS — Z0000 Encounter for general adult medical examination without abnormal findings: Secondary | ICD-10-CM | POA: Diagnosis not present

## 2019-08-12 DIAGNOSIS — I2510 Atherosclerotic heart disease of native coronary artery without angina pectoris: Secondary | ICD-10-CM | POA: Diagnosis not present

## 2019-08-12 DIAGNOSIS — N183 Chronic kidney disease, stage 3 unspecified: Secondary | ICD-10-CM

## 2019-08-12 DIAGNOSIS — J449 Chronic obstructive pulmonary disease, unspecified: Secondary | ICD-10-CM

## 2019-08-12 DIAGNOSIS — E782 Mixed hyperlipidemia: Secondary | ICD-10-CM

## 2019-08-12 DIAGNOSIS — R3911 Hesitancy of micturition: Secondary | ICD-10-CM

## 2019-08-12 DIAGNOSIS — I129 Hypertensive chronic kidney disease with stage 1 through stage 4 chronic kidney disease, or unspecified chronic kidney disease: Secondary | ICD-10-CM | POA: Diagnosis not present

## 2019-08-12 DIAGNOSIS — N401 Enlarged prostate with lower urinary tract symptoms: Secondary | ICD-10-CM | POA: Diagnosis not present

## 2019-08-12 LAB — UA/M W/RFLX CULTURE, ROUTINE
Bilirubin, UA: NEGATIVE
Glucose, UA: NEGATIVE
Ketones, UA: NEGATIVE
Leukocytes,UA: NEGATIVE
Nitrite, UA: NEGATIVE
Protein,UA: NEGATIVE
RBC, UA: NEGATIVE
Specific Gravity, UA: 1.025 (ref 1.005–1.030)
Urobilinogen, Ur: 0.2 mg/dL (ref 0.2–1.0)
pH, UA: 5 (ref 5.0–7.5)

## 2019-08-12 LAB — MICROALBUMIN, URINE WAIVED
Creatinine, Urine Waived: 200 mg/dL (ref 10–300)
Microalb, Ur Waived: 10 mg/L (ref 0–19)
Microalb/Creat Ratio: <30 mg/g (ref <30)

## 2019-08-12 MED ORDER — LISINOPRIL 40 MG PO TABS: 40.0000 mg | ORAL_TABLET | Freq: Every day | ORAL | 1 refills | Status: DC

## 2019-08-12 MED ORDER — ALBUTEROL SULFATE HFA 108 (90 BASE) MCG/ACT IN AERS: INHALATION_SPRAY | RESPIRATORY_TRACT | 6 refills | Status: DC

## 2019-08-12 MED ORDER — TAMSULOSIN HCL 0.4 MG PO CAPS: 0.4000 mg | ORAL_CAPSULE | Freq: Every day | ORAL | 1 refills | Status: DC

## 2019-08-12 MED ORDER — SIMVASTATIN 40 MG PO TABS: 40.0000 mg | ORAL_TABLET | Freq: Every day | ORAL | 1 refills | Status: DC

## 2019-08-12 MED ORDER — NAPROXEN 500 MG PO TABS: 500.0000 mg | ORAL_TABLET | Freq: Two times a day (BID) | ORAL | 3 refills | Status: DC

## 2019-08-12 MED ORDER — METOPROLOL TARTRATE 25 MG PO TABS: 25.0000 mg | ORAL_TABLET | Freq: Two times a day (BID) | ORAL | 1 refills | Status: DC

## 2019-08-12 NOTE — Assessment & Plan Note (Signed)
Under good control on current regimen. Continue current regimen. Continue to monitor. Call with any concerns. Refills given. Labs drawn today.   

## 2019-08-12 NOTE — Assessment & Plan Note (Signed)
Under good control. Continue to monitor. Call with any concerns. Continue to monitor.

## 2019-08-12 NOTE — Assessment & Plan Note (Signed)
Rechecking labs today. Await results.  

## 2019-08-12 NOTE — Assessment & Plan Note (Signed)
Will keep BP and cholesterol under good control. Continue to monitor.  

## 2019-08-12 NOTE — Progress Notes (Signed)
Subjective:   Mike [name] Student Mike [name]. is a 64 y.o. male who presents for Medicare Annual/Subsequent preventive examination.  This visit is being conducted via phone call  - after an attmept to do on video chat - due to the COVID-19 pandemic. This patient has given me verbal consent via phone to conduct this visit, patient states they are participating from their home address. Some vital signs may be absent or patient reported.   Patient identification: identified by name, DOB, and current address.     Review of Systems:   Cardiac Risk Factors include: male gender;advanced age (>62men, >75 women);dyslipidemia;hypertension     Objective:    Vitals: There were no vitals taken for this visit.  There is no height or weight on file to calculate BMI.  Advanced Directives 02/25/2018 02/21/2017 04/02/2016  Does Patient Have a Medical Advance Directive? No No No  Would patient like information on creating a medical advance directive? Yes (MAU/Ambulatory/Procedural Areas - Information given) No - Patient declined Yes - Educational materials given    Tobacco Social History   Tobacco Use  Smoking Status Former Smoker  . Packs/day: 1.00  . Years: 30.00  . Pack years: 30.00  . Types: Cigarettes  . Quit date: 04/12/2016  . Years since quitting: 3.3  Smokeless Tobacco Never Used  Tobacco Comment   since 15, max 1 ppd x10     Counseling given: Not Answered Comment: since 15, max 1 ppd x10   Clinical Intake:  Pre-visit preparation completed: Yes  Pain : 0-10 Pain Score: 2  Pain Type: Chronic pain Pain Location: Back Pain Orientation: Upper, Lower Pain Descriptors / Indicators: Aching Pain Onset: More than a month ago Pain Frequency: Intermittent     Nutritional Risks: None Diabetes: No  How often do you need to have someone help you when you read instructions, pamphlets, or other written materials from your doctor or pharmacy?: 1 - Never  Interpreter Needed?:  No  Information entered by :: Mike Loy,LPN  Past Medical History:  Diagnosis Date  . Arthritis   . Benign enlargement of prostate   . CAD (coronary artery disease)   . Chronic kidney disease   . History of seizures as a child    Due to head injury  . Hyperlipidemia   . Hypertension   . Lumbago   . Myocardial infarction ([place])   . Urinary hesitancy    Past Surgical History:  Procedure Laterality Date  . coronary artery stent     Family History  Problem Relation Age of Onset  . Hypertension Mother   . Breast cancer Mother   . Hypertension Father   . Stroke Father   . Stroke Maternal Grandfather    Social History   Socioeconomic History  . Marital status: Divorced    Spouse name: Not on file  . Number of children: Not on file  . Years of education: Not on file  . Highest education level: Some college, no degree  Occupational History  . Not on file  Tobacco Use  . Smoking status: Former Smoker    Packs/day: 1.00    Years: 30.00    Pack years: 30.00    Types: Cigarettes    Quit date: 04/12/2016    Years since quitting: 3.3  . Smokeless tobacco: Never Used  . Tobacco comment: since 15, max 1 ppd x10  Substance and Sexual Activity  . Alcohol use: No    Alcohol/week: 0.0 standard drinks  . Drug use:  No  . Sexual activity: Yes    Birth control/protection: None  Other Topics Concern  . Not on file  Social History Narrative   Goes to gym 6-7 days a week    Personal trainer    Social Determinants of Health   Financial Resource Strain: Low Risk   . Difficulty of Paying Living Expenses: Not hard at all  Food Insecurity: No Food Insecurity  . Worried About Charity fundraiser in the Last Year: Never true  . Ran Out of Food in the Last Year: Never true  Transportation Needs: No Transportation Needs  . Lack of Transportation (Medical): No  . Lack of Transportation (Non-Medical): No  Physical Activity: Sufficiently Active  . Days of Exercise per Week: 7 days   . Minutes of Exercise per Session: 60 min  Stress:   . Feeling of Stress :   Social Connections: Somewhat Isolated  . Frequency of Communication with Friends and Family: More than three times a week  . Frequency of Social Gatherings with Friends and Family: More than three times a week  . Attends Religious Services: Never  . Active Member of Clubs or Organizations: Yes  . Attends Archivist Meetings: More than 4 times per year  . Marital Status: Divorced    Outpatient Encounter Medications as of 08/12/2019  Medication Sig  . albuterol (VENTOLIN HFA) 108 (90 Base) MCG/ACT inhaler INHALE 2 PUFFS INTO THE LUNGS EVERY 6  HOURS AS NEEDED FOR WHEEZING OR SHORTNESS OF BREATH.  Marland Kitchen aspirin EC 81 MG tablet Take 81 mg by mouth daily.   Marland Kitchen EPINEPHrine 0.3 mg/0.3 mL IJ SOAJ injection   . lisinopril (ZESTRIL) 40 MG tablet Take 1 tablet (40 mg total) by mouth daily.  . metoprolol tartrate (LOPRESSOR) 25 MG tablet Take 1 tablet (25 mg total) by mouth 2 (two) times daily.  . naproxen (NAPROSYN) 500 MG tablet Take 1 tablet (500 mg total) by mouth 2 (two) times daily with a meal.  . simvastatin (ZOCOR) 40 MG tablet Take 1 tablet (40 mg total) by mouth daily.  . tamsulosin (FLOMAX) 0.4 MG CAPS capsule Take 1 capsule (0.4 mg total) by mouth daily.   No facility-administered encounter medications on file as of 08/12/2019.    Activities of Daily Living In your present state of health, do you have any difficulty performing the following activities: 08/12/2019  Hearing? N  Comment no hearing aids  Vision? N  Comment reading glasses, no eye dr  Difficulty concentrating or making decisions? N  Walking or climbing stairs? N  Dressing or bathing? N  Doing errands, shopping? N  Preparing Food and eating ? N  Using the Toilet? N  In the past six months, have you accidently leaked urine? N  Do you have problems with loss of bowel control? N  Managing your Medications? N  Managing your Finances? N   Housekeeping or managing your Housekeeping? N  Some recent data might be hidden    Patient Care Team: Mike Roys, DO as PCP - General (Family Medicine)   Assessment:   This is a routine wellness examination for Mike [name].  Exercise Activities and Dietary recommendations Current Exercise Habits: The patient does not participate in regular exercise at present, Type of exercise: treadmill;walking;stretching;strength training/weights, Time (Minutes): 60, Frequency (Times/Week): 7, Weekly Exercise (Minutes/Week): 420, Intensity: Mild, Exercise limited by: None identified  Goals Addressed   None     Fall Risk: Fall Risk  08/12/2019 02/25/2018 04/07/2017 02/21/2017 10/01/2016  Falls in the past year? 0 No No No No  Number falls in past yr: 0 - - - -  Injury with Fall? 0 - - - -    FALL RISK PREVENTION PERTAINING TO THE HOME:  Any stairs in or around the home? No  If so, are there any without handrails? Yes   Home free of loose throw rugs in walkways, pet beds, electrical cords, etc? Yes  Adequate lighting in your home to reduce risk of falls? Yes   ASSISTIVE DEVICES UTILIZED TO PREVENT FALLS:  Life alert? No  Use of a cane, walker or w/c? No  Grab bars in the bathroom? No  Shower chair or bench in shower? No  Elevated toilet seat or a handicapped toilet? No   TIMED UP AND GO:  Unable to perform   Depression Screen PHQ 2/9 Scores 08/12/2019 04/10/2018 02/25/2018 04/07/2017  PHQ - 2 Score 0 0 0 0  PHQ- 9 Score - 1 - 1    Cognitive Function     6CIT Screen 02/25/2018 02/21/2017 04/02/2016  What Year? 0 points 0 points 0 points  What month? 0 points 0 points 0 points  What time? 0 points 0 points 0 points  Count back from 20 0 points 0 points 0 points  Months in reverse 0 points 0 points 0 points  Repeat phrase 2 points 2 points 2 points  Total Score 2 2 2     Immunization History  Administered Date(s) Administered  . Influenza,inj,Quad PF,6+ Mos 03/20/2015,  04/02/2016, 02/21/2017, 02/25/2018, 02/02/2019  . Pneumococcal Conjugate-13 02/16/2014  . Tdap 05/20/2010  . Zoster 07/19/2014    Qualifies for Shingles Vaccine? Yes  Zostavax completed 2016. Due for Shingrix. Education has been provided regarding the importance of this vaccine. Pt has been advised to call insurance company to determine out of pocket expense. Advised may also receive vaccine at local pharmacy or Health Dept. Verbalized acceptance and understanding.  Tdap: up to date   Flu Vaccine: up to date   Pneumococcal Vaccine: due at age 35    Covid-19 Vaccine:  Information provided  Screening Tests Health Maintenance  Topic Date Due  . COLONOSCOPY  05/04/2019  . TETANUS/TDAP  05/20/2020  . INFLUENZA VACCINE  Completed  . Hepatitis C Screening  Completed  . HIV Screening  Completed   Cancer Screenings:  Colorectal Screening: Completed 2015. Repeat every 5 years;  Referral to GI placed today  Lung Cancer Screening: (Low Dose CT Chest recommended if Age 101-80 years, 30 pack-year currently smoking OR have quit w/in 15years.) does qualify.   Completed 10/02/2018  Additional Screening:  Hepatitis C Screening: does qualify; Completed 2016  Vision Screening: Recommended annual ophthalmology exams for early detection of glaucoma and other disorders of the eye. Is the patient up to date with their annual eye exam?  No   Dental Screening: Recommended annual dental exams for proper oral hygiene  Community Resource Referral:  CRR required this visit?  No        Plan:  I have personally reviewed and addressed the Medicare Annual Wellness questionnaire and have noted the following in the patient's chart:  A. Medical and social history B. Use of alcohol, tobacco or illicit drugs  C. Current medications and supplements D. Functional ability and status E.  Nutritional status F.  Physical activity G. Advance directives H. List of other physicians I.  Hospitalizations,  surgeries, and ER visits in previous 12 months J.  [place]place such as  hearing and vision if needed, cognitive and depression L. Referrals and appointments   In addition, I have reviewed and discussed with patient certain preventive protocols, quality metrics, and best practice recommendations. A written personalized care plan for preventive services as well as general preventive health recommendations were provided to patient.   Signed,   Bevelyn Ngo, LPN  [id number] Nurse Health Advisor   Nurse Notes: none

## 2019-08-12 NOTE — Progress Notes (Signed)
BP 133/73 (BP Location: Right Arm, Cuff Size: Normal)   Pulse (!) 48   Temp (!) 97.4 F (36.3 C) (Oral)   SpO2 94%    Subjective:    Patient ID: Mike Desanctis., male    DOB: November 02, 1955, 64 y.o.   MRN: [id number]  HPI: Mike Dauzat Friedhoff [name] is a 64 y.o. male presenting on 08/12/2019 for comprehensive medical examination. Current medical complaints include:  HYPERTENSION / HYPERLIPIDEMIA Satisfied with current treatment? yes Duration of hypertension: chronic BP monitoring frequency: not checking BP medication side effects: no Past BP meds: lisinopril, metoprolol Duration of hyperlipidemia: chronic Cholesterol medication side effects: no Cholesterol supplements: none Past cholesterol medications: simvastatin Medication compliance: excellent compliance Aspirin: yes Recent stressors: no Recurrent headaches: no Visual changes: no Palpitations: no Dyspnea: no Chest pain: no Lower extremity edema: no Dizzy/lightheaded: no  COPD COPD status: controlled Satisfied with current treatment?: yes Oxygen use: no Dyspnea frequency: rarely Cough frequency: rarely Rescue inhaler frequency: rarely Limitation of activity: no Influenza: Up to Date  BPH BPH status: controlled Satisfied with current treatment?: yes Medication side effects: no Medication compliance: excellent compliance Duration: chronic Nocturia: no Urinary frequency:no Incomplete voiding: no Urgency: no Weak urinary stream: no Straining to start stream: no Dysuria: no Onset: gradual Severity: mild  Interim Problems from his last visit: no  Depression Screen done today and results listed below:  Depression screen [facility] 2/9 08/12/2019 04/10/2018 02/25/2018 04/07/2017 02/21/2017  Decreased Interest 0 0 0 0 0  Down, Depressed, Hopeless 0 0 0 0 0  PHQ - 2 Score 0 0 0 0 0  Altered sleeping - 1 - 1 -  Tired, decreased energy - 0 - 0 -  Change in appetite - 0 - 0 -  Feeling bad or failure about  yourself  - 0 - 0 -  Trouble concentrating - 0 - 0 -  Moving slowly or fidgety/restless - 0 - 0 -  Suicidal thoughts - 0 - 0 -  PHQ-9 Score - 1 - 1 -  Difficult doing work/chores - Not difficult at all - - -    Past Medical History:  Past Medical History:  Diagnosis Date  . Arthritis   . Benign enlargement of prostate   . CAD (coronary artery disease)   . Chronic kidney disease   . History of seizures as a child    Due to head injury  . Hyperlipidemia   . Hypertension   . Lumbago   . Myocardial infarction ([place])   . Urinary hesitancy     Surgical History:  Past Surgical History:  Procedure Laterality Date  . coronary artery stent      Medications:  Current Outpatient Medications on File Prior to Visit  Medication Sig  . aspirin EC 81 MG tablet Take 81 mg by mouth daily.   Marland Kitchen EPINEPHrine 0.3 mg/0.3 mL IJ SOAJ injection    No current facility-administered medications on file prior to visit.    Allergies:  Allergies  Allergen Reactions  . Shellfish Allergy Anaphylaxis  . Peanuts [Peanut Oil] Rash  . Geralyn Flash [Fish Allergy] Hives    Social History:  Social History   Socioeconomic History  . Marital status: Divorced    Spouse name: Not on file  . Number of children: Not on file  . Years of education: Not on file  . Highest education level: Some college, no degree  Occupational History  . Not on file  Tobacco Use  . Smoking status: Former  Smoker    Packs/day: 1.00    Years: 30.00    Pack years: 30.00    Types: Cigarettes    Quit date: 04/12/2016    Years since quitting: 3.3  . Smokeless tobacco: Never Used  . Tobacco comment: since 15, max 1 ppd x10  Substance and Sexual Activity  . Alcohol use: No    Alcohol/week: 0.0 standard drinks  . Drug use: No  . Sexual activity: Yes    Birth control/protection: None  Other Topics Concern  . Not on file  Social History Narrative   Goes to gym 6-7 days a week    Personal trainer    Social Determinants of  Health   Financial Resource Strain: Low Risk   . Difficulty of Paying Living Expenses: Not hard at all  Food Insecurity: No Food Insecurity  . Worried About Charity fundraiser in the Last Year: Never true  . Ran Out of Food in the Last Year: Never true  Transportation Needs: No Transportation Needs  . Lack of Transportation (Medical): No  . Lack of Transportation (Non-Medical): No  Physical Activity: Sufficiently Active  . Days of Exercise per Week: 7 days  . Minutes of Exercise per Session: 60 min  Stress:   . Feeling of Stress :   Social Connections: Somewhat Isolated  . Frequency of Communication with Friends and Family: More than three times a week  . Frequency of Social Gatherings with Friends and Family: More than three times a week  . Attends Religious Services: Never  . Active Member of Clubs or Organizations: Yes  . Attends Archivist Meetings: More than 4 times per year  . Marital Status: Divorced  Human resources officer Violence:   . Fear of Current or Ex-Partner:   . Emotionally Abused:   Marland Kitchen Physically Abused:   . Sexually Abused:    Social History   Tobacco Use  Smoking Status Former Smoker  . Packs/day: 1.00  . Years: 30.00  . Pack years: 30.00  . Types: Cigarettes  . Quit date: 04/12/2016  . Years since quitting: 3.3  Smokeless Tobacco Never Used  Tobacco Comment   since 15, max 1 ppd x10   Social History   Substance and Sexual Activity  Alcohol Use No  . Alcohol/week: 0.0 standard drinks    Family History:  Family History  Problem Relation Age of Onset  . Hypertension Mother   . Breast cancer Mother   . Hypertension Father   . Stroke Father   . Stroke Maternal Grandfather     Past medical history, surgical history, medications, allergies, family history and social history reviewed with patient today and changes made to appropriate areas of the chart.   Review of Systems  Constitutional: Negative.   HENT: Negative.   Eyes: Negative.    Respiratory: Negative.   Cardiovascular: Negative.   Gastrointestinal: Negative.   Genitourinary: Negative.   Musculoskeletal: Negative.   Skin: Negative.   Neurological: Negative.   Endo/Heme/Allergies: Negative.   Psychiatric/Behavioral: Negative.     All other ROS negative except what is listed above and in the HPI.      Objective:    BP 133/73 (BP Location: Right Arm, Cuff Size: Normal)   Pulse (!) 48   Temp (!) 97.4 F (36.3 C) (Oral)   SpO2 94%   Wt Readings from Last 3 Encounters:  02/02/19 213 lb (96.6 kg)  10/01/18 205 lb (93 kg)  04/10/18 206 lb (93.4 kg)  Physical Exam Vitals and nursing note reviewed.  Constitutional:      General: He is not in acute distress.    Appearance: Normal appearance. He is obese. He is not ill-appearing, toxic-appearing or diaphoretic.  HENT:     Head: Normocephalic and atraumatic.     Right Ear: Tympanic membrane, ear canal and external ear normal. There is no impacted cerumen.     Left Ear: Tympanic membrane, ear canal and external ear normal. There is no impacted cerumen.     Nose: Nose normal. No congestion or rhinorrhea.     Mouth/Throat:     Mouth: Mucous membranes are moist.     Pharynx: Oropharynx is clear. No oropharyngeal exudate or posterior oropharyngeal erythema.  Eyes:     General: No scleral icterus.       Right eye: No discharge.        Left eye: No discharge.     Extraocular Movements: Extraocular movements intact.     Conjunctiva/sclera: Conjunctivae normal.     Pupils: Pupils are equal, round, and reactive to light.  Neck:     Vascular: No carotid bruit.  Cardiovascular:     Rate and Rhythm: Normal rate and regular rhythm.     Pulses: Normal pulses.     Heart sounds: No murmur. No friction rub. No gallop.   Pulmonary:     Effort: Pulmonary effort is normal. No respiratory distress.     Breath sounds: Normal breath sounds. No stridor. No wheezing, rhonchi or rales.  Chest:     Chest wall: No  tenderness.  Abdominal:     General: Abdomen is flat. Bowel sounds are normal. There is no distension.     Palpations: Abdomen is soft. There is no mass.     Tenderness: There is no abdominal tenderness. There is no right CVA tenderness, left CVA tenderness, guarding or rebound.     Hernia: No hernia is present.  Genitourinary:    Comments: Genital exam deferred with shared decision making Musculoskeletal:        General: No swelling, tenderness, deformity or signs of injury.     Cervical back: Normal range of motion and neck supple. No rigidity. No muscular tenderness.     Right lower leg: No edema.     Left lower leg: No edema.  Lymphadenopathy:     Cervical: No cervical adenopathy.  Skin:    General: Skin is warm and dry.     Capillary Refill: Capillary refill takes less than 2 seconds.     Coloration: Skin is not jaundiced or pale.     Findings: No bruising, erythema, lesion or rash.  Neurological:     General: No focal deficit present.     Mental Status: He is alert and oriented to person, place, and time.     Cranial Nerves: No cranial nerve deficit.     Sensory: No sensory deficit.     Motor: No weakness.     Coordination: Coordination normal.     Gait: Gait normal.     Deep Tendon Reflexes: Reflexes normal.  Psychiatric:        Mood and Affect: Mood normal.        Behavior: Behavior normal.        Thought Content: Thought content normal.        Judgment: Judgment normal.     Results for orders placed or performed in visit on 02/02/19  Comprehensive metabolic panel  Result Value Ref Range  Glucose 102 (H) 65 - 99 mg/dL   BUN 23 8 - 27 mg/dL   Creatinine, Ser 1.74 (H) 0.76 - 1.27 mg/dL   GFR calc non Af Amer 41 (L) >59 mL/min/1.73   GFR calc Af Amer 47 (L) >59 mL/min/1.73   BUN/Creatinine Ratio 13 10 - 24   Sodium 141 134 - 144 mmol/L   Potassium 5.0 3.5 - 5.2 mmol/L   Chloride 105 96 - 106 mmol/L   CO2 22 20 - 29 mmol/L   Calcium 9.0 8.6 - 10.2 mg/dL   Total  Protein 6.4 6.0 - 8.5 g/dL   Albumin 3.9 3.8 - 4.8 g/dL   Globulin, Total 2.5 1.5 - 4.5 g/dL   Albumin/Globulin Ratio 1.6 1.2 - 2.2   Bilirubin Total 0.4 0.0 - 1.2 mg/dL   Alkaline Phosphatase 120 (H) 39 - 117 IU/L   AST 31 0 - 40 IU/L   ALT 16 0 - 44 IU/L  Lipid Panel w/o Chol/HDL Ratio  Result Value Ref Range   Cholesterol, Total 139 100 - 199 mg/dL   Triglycerides 55 0 - 149 mg/dL   HDL 52 >39 mg/dL   VLDL Cholesterol Cal 12 5 - 40 mg/dL   LDL Chol Calc (NIH) 75 0 - 99 mg/dL      Assessment & Plan:   Problem List Items Addressed This Visit      Cardiovascular and Mediastinum   CAD (coronary artery disease)    Will keep BP and cholesterol under good control. Continue to monitor.       Relevant Medications   simvastatin (ZOCOR) 40 MG tablet   metoprolol tartrate (LOPRESSOR) 25 MG tablet   lisinopril (ZESTRIL) 40 MG tablet   Other Relevant Orders   CBC with Differential/Platelet   Comprehensive metabolic panel   TSH     Respiratory   COPD (chronic obstructive pulmonary disease) (HCC)    Under good control. Continue to monitor. Call with any concerns. Continue to monitor.       Relevant Medications   albuterol (VENTOLIN HFA) 108 (90 Base) MCG/ACT inhaler   Other Relevant Orders   CBC with Differential/Platelet   Comprehensive metabolic panel   TSH     Genitourinary   Benign hypertensive renal disease    Under good control on current regimen. Continue current regimen. Continue to monitor. Call with any concerns. Refills given. Labs drawn today       Relevant Orders   CBC with Differential/Platelet   Comprehensive metabolic panel   Microalbumin, Urine Waived   TSH   CKD (chronic kidney disease), stage III    Rechecking labs today. Await results.       Relevant Orders   CBC with Differential/Platelet   Comprehensive metabolic panel   TSH     Other   HLD (hyperlipidemia)    Under good control on current regimen. Continue current regimen. Continue to  monitor. Call with any concerns. Refills given. Labs drawn today      Relevant Medications   simvastatin (ZOCOR) 40 MG tablet   metoprolol tartrate (LOPRESSOR) 25 MG tablet   lisinopril (ZESTRIL) 40 MG tablet   Other Relevant Orders   CBC with Differential/Platelet   Comprehensive metabolic panel   Lipid Panel w/o Chol/HDL Ratio   TSH   Urinary hesitancy due to benign prostatic hyperplasia    Under good control on current regimen. Continue current regimen. Continue to monitor. Call with any concerns. Refills given. Labs drawn today  Relevant Orders   PSA   UA/M w/rflx Culture, Routine    Other Visit Diagnoses    Routine general medical examination at a health care facility    -  Primary   Vaccines up to date. Screening labs checked today. Colonscopy ordered today. Continue diet and exercise. Call with any concerns.    Screening for colon cancer       Relevant Orders   Ambulatory referral to Gastroenterology       Discussed aspirin prophylaxis for myocardial infarction prevention and decision was made to continue ASA  LABORATORY TESTING:  Health maintenance labs ordered today as discussed above.   The natural history of prostate cancer and ongoing controversy regarding screening and potential treatment outcomes of prostate cancer has been discussed with the patient. The meaning of a false positive PSA and a false negative PSA has been discussed. He indicates understanding of the limitations of this screening test and wishes to proceed with screening PSA testing.   IMMUNIZATIONS:   - Tdap: Tetanus vaccination status reviewed: last tetanus booster within 10 years. - Influenza: Up to date - Pneumovax: Up to date  SCREENING: - Colonoscopy: Ordered today  Discussed with patient purpose of the colonoscopy is to detect colon cancer at curable precancerous or early stages   PATIENT COUNSELING:    Sexuality: Discussed sexually transmitted diseases, partner selection, use of  condoms, avoidance of unintended pregnancy  and contraceptive alternatives.   Advised to avoid cigarette smoking.  I discussed with the patient that most people either abstain from alcohol or drink within safe limits (<=14/week and <=4 drinks/occasion for males, <=7/weeks and <= 3 drinks/occasion for females) and that the risk for alcohol disorders and other health effects rises proportionally with the number of drinks per week and how often a drinker exceeds daily limits.  Discussed cessation/primary prevention of drug use and availability of treatment for abuse.   Diet: Encouraged to adjust caloric intake to maintain  or achieve ideal body weight, to reduce intake of dietary saturated fat and total fat, to limit sodium intake by avoiding high sodium foods and not adding table salt, and to maintain adequate dietary potassium and calcium preferably from fresh fruits, vegetables, and low-fat dairy products.    stressed the importance of regular exercise  Injury prevention: Discussed safety belts, safety helmets, smoke detector, smoking near bedding or upholstery.   Dental health: Discussed importance of regular tooth brushing, flossing, and dental visits.   Follow up plan: NEXT PREVENTATIVE PHYSICAL DUE IN 1 YEAR. Return in about 6 months (around 02/12/2020).

## 2019-08-12 NOTE — Patient Instructions (Signed)
Mike [name] , Thank you for taking time to come for your Medicare Wellness Visit. I appreciate your ongoing commitment to your health goals. Please review the following plan we discussed and let me know if I can assist you in the future.   Screening recommendations/referrals: Colonoscopy: referral placed to GI, someone should call you to schedule  Recommended yearly ophthalmology/optometry visit for glaucoma screening and checkup Recommended yearly dental visit for hygiene and checkup  Vaccinations: Influenza vaccine: up to date  Pneumococcal vaccine: due at age 77 Tdap vaccine: up to date  Shingles vaccine: shingrix eligible    Covid-19: We are recommending the vaccine to everyone who has not had an allergic reaction to any of the components of the vaccine. If you have specific questions about the vaccine, please bring them up with your health care provider to discuss them.   We will likely not be getting the vaccine in the office for the first rounds of vaccinations. The way they are releasing the vaccines is going to be through the health systems (like [place], [place], [facility], [facility]), through your county health department, or through the pharmacies.   [facility] Department is giving vaccines to those 65+ and Health Care Workers Teachers and [place] providers start 07/14/19, Essential workers start 3/10 and those with co-morbidities start 08/11/19 Call [phone] to schedule  If you are 65+ you can get a vaccine through [facility] by signing up for an appointment.  You can sign up by going to: [email or web address].  You can get more information by going to: [email or web address]  [employer] next door is giving the [employer]- you can call [phone] or stop by there to schedule.  Advanced directives: ,Advance directive discussed with you today.Once this is complete please bring a copy in to our office so we can scan it into your  chart.  Conditions/risks identified: none   Next appointment: Follow up in one year for your annual wellness visit    Preventive care refers to lifestyle choices and visits with your health care provider that can promote health and wellness. What does preventive care include?  A yearly physical exam. This is also called an annual well check.  Dental exams once or twice a year.  Routine eye exams. Ask your health care provider how often you should have your eyes checked.  Personal lifestyle choices, including:  Daily care of your teeth and gums.  Regular physical activity.  Eating a healthy diet.  Avoiding tobacco and drug use.  Limiting alcohol use.  Practicing safe sex.  Taking low doses of aspirin every day.  Taking vitamin and mineral supplements as recommended by your health care provider. What happens during an annual well check? The services and screenings done by your health care provider during your annual well check will depend on your age, overall health, lifestyle risk factors, and family history of disease. Counseling  Your health care provider may ask you questions about your:  Alcohol use.  Tobacco use.  Drug use.  Emotional well-being.  Home and relationship well-being.  Sexual activity.  Eating habits.  History of falls.  Memory and ability to understand (cognition).  Work and work Statistician. Screening  You may have the following tests or measurements:  Height, weight, and BMI.  Blood pressure.  Lipid and cholesterol levels. These may be checked every 5 years, or more frequently if you are over 54 years old.  Skin check.  Lung cancer screening. You may have this screening  every year starting at age 72 if you have a 30-pack-year history of smoking and currently smoke or have quit within the past 15 years.  Fecal occult blood test (FOBT) of the stool. You may have this test every year starting at age 69.  Flexible sigmoidoscopy  or colonoscopy. You may have a sigmoidoscopy every 5 years or a colonoscopy every 10 years starting at age 7.  Prostate cancer screening. Recommendations will vary depending on your family history and other risks.  Hepatitis C blood test.  Hepatitis B blood test.  Sexually transmitted disease (STD) testing.  Diabetes screening. This is done by checking your blood sugar (glucose) after you have not eaten for a while (fasting). You may have this done every 1-3 years.  Abdominal aortic aneurysm (AAA) screening. You may need this if you are a current or former smoker.  Osteoporosis. You may be screened starting at age 65 if you are at high risk. Talk with your health care provider about your test results, treatment options, and if necessary, the need for more tests. Vaccines  Your health care provider may recommend certain vaccines, such as:  Influenza vaccine. This is recommended every year.  Tetanus, diphtheria, and acellular pertussis (Tdap, Td) vaccine. You may need a Td booster every 10 years.  Zoster vaccine. You may need this after age 8.  Pneumococcal 13-valent conjugate (PCV13) vaccine. One dose is recommended after age 77.  Pneumococcal polysaccharide (PPSV23) vaccine. One dose is recommended after age 5. Talk to your health care provider about which screenings and vaccines you need and how often you need them. This information is not intended to replace advice given to you by your health care provider. Make sure you discuss any questions you have with your health care provider. Document Released: 06/02/2015 Document Revised: 01/24/2016 Document Reviewed: 03/07/2015 Elsevier Interactive Patient Education  2017 [employer] Prevention in the Home Falls can cause injuries. They can happen to people of all ages. There are many things you can do to make your home safe and to help prevent falls. What can I do on the outside of my home?  Regularly fix the edges of  walkways and driveways and fix any cracks.  Remove anything that might make you trip as you walk through a door, such as a raised step or threshold.  Trim any bushes or trees on the path to your home.  Use bright outdoor lighting.  Clear any walking paths of anything that might make someone trip, such as rocks or tools.  Regularly check to see if handrails are loose or broken. Make sure that both sides of any steps have handrails.  Any raised decks and porches should have guardrails on the edges.  Have any leaves, snow, or ice cleared regularly.  Use sand or salt on walking paths during winter.  Clean up any spills in your garage right away. This includes oil or grease spills. What can I do in the bathroom?  Use night lights.  Install grab bars by the toilet and in the tub and shower. Do not use towel bars as grab bars.  Use non-skid mats or decals in the tub or shower.  If you need to sit down in the shower, use a plastic, non-slip stool.  Keep the floor dry. Clean up any water that spills on the floor as soon as it happens.  Remove soap buildup in the tub or shower regularly.  Attach bath mats securely with double-sided non-slip rug tape.  Do not have throw rugs and other things on the floor that can make you trip. What can I do in the bedroom?  Use night lights.  Make sure that you have a light by your bed that is easy to reach.  Do not use any sheets or blankets that are too big for your bed. They should not hang down onto the floor.  Have a firm chair that has side arms. You can use this for support while you get dressed.  Do not have throw rugs and other things on the floor that can make you trip. What can I do in the kitchen?  Clean up any spills right away.  Avoid walking on wet floors.  Keep items that you use a lot in easy-to-reach places.  If you need to reach something above you, use a strong step stool that has a grab bar.  Keep electrical cords  out of the way.  Do not use floor polish or wax that makes floors slippery. If you must use wax, use non-skid floor wax.  Do not have throw rugs and other things on the floor that can make you trip. What can I do with my stairs?  Do not leave any items on the stairs.  Make sure that there are handrails on both sides of the stairs and use them. Fix handrails that are broken or loose. Make sure that handrails are as long as the stairways.  Check any carpeting to make sure that it is firmly attached to the stairs. Fix any carpet that is loose or worn.  Avoid having throw rugs at the top or bottom of the stairs. If you do have throw rugs, attach them to the floor with carpet tape.  Make sure that you have a light switch at the top of the stairs and the bottom of the stairs. If you do not have them, ask someone to add them for you. What else can I do to help prevent falls?  Wear shoes that:  Do not have high heels.  Have rubber bottoms.  Are comfortable and fit you well.  Are closed at the toe. Do not wear sandals.  If you use a stepladder:  Make sure that it is fully opened. Do not climb a closed stepladder.  Make sure that both sides of the stepladder are locked into place.  Ask someone to hold it for you, if possible.  Clearly mark and make sure that you can see:  Any grab bars or handrails.  First and last steps.  Where the edge of each step is.  Use tools that help you move around (mobility aids) if they are needed. These include:  Canes.  Walkers.  Scooters.  Crutches.  Turn on the lights when you go into a dark area. Replace any light bulbs as soon as they burn out.  Set up your furniture so you have a clear path. Avoid moving your furniture around.  If any of your floors are uneven, fix them.  If there are any pets around you, be aware of where they are.  Review your medicines with your doctor. Some medicines can make you feel dizzy. This can increase  your chance of falling. Ask your doctor what other things that you can do to help prevent falls. This information is not intended to replace advice given to you by your health care provider. Make sure you discuss any questions you have with your health care provider. Document Released: 03/02/2009 Document Revised:  10/12/2015 Document Reviewed: 06/10/2014 Elsevier Interactive Patient Education  2017 [employer].

## 2019-08-12 NOTE — Patient Instructions (Addendum)
We are recommending the vaccine to everyone who has not had an allergic reaction to any of the components of the vaccine. If you have specific questions about the vaccine, please bring them up with your health care provider to discuss them.   We will likely not be getting the vaccine in the office for the first rounds of vaccinations. The way they are releasing the vaccines is going to be through the health systems (like [place], [place], [facility], [facility]), through your county health department, or through the pharmacies.   [facility] Department is giving vaccines to those 65+ and Health Care Workers Teachers and [place] providers start 07/14/19, Essential workers start 3/10 and those with co-morbidities start 08/11/19 Call [phone] to schedule  If you are 65+ you can get a vaccine through [facility] by signing up for an appointment.  You can sign up by going to: [email or web address].  You can get more information by going to: [email or web address]  [employer] next door is giving the [employer]- you can call [phone] or stop by there to schedule.   Health Maintenance, Male Adopting a healthy lifestyle and getting preventive care are important in promoting health and wellness. Ask your health care provider about:  The right schedule for you to have regular tests and exams.  Things you can do on your own to prevent diseases and keep yourself healthy. What should I know about diet, weight, and exercise? Eat a healthy diet   Eat a diet that includes plenty of vegetables, fruits, low-fat dairy products, and lean protein.  Do not eat a lot of foods that are high in solid fats, added sugars, or sodium. Maintain a healthy weight Body mass index (BMI) is a measurement that can be used to identify possible weight problems. It estimates body fat based on height and weight. Your health care provider can help determine your BMI and help you achieve or  maintain a healthy weight. Get regular exercise Get regular exercise. This is one of the most important things you can do for your health. Most adults should:  Exercise for at least 150 minutes each week. The exercise should increase your heart rate and make you sweat (moderate-intensity exercise).  Do strengthening exercises at least twice a week. This is in addition to the moderate-intensity exercise.  Spend less time sitting. Even light physical activity can be beneficial. Watch cholesterol and blood lipids Have your blood tested for lipids and cholesterol at 65 years of age, then have this test every 5 years. You may need to have your cholesterol levels checked more often if:  Your lipid or cholesterol levels are high.  You are older than 65 years of age.  You are at high risk for heart disease. What should I know about cancer screening? Many types of cancers can be detected early and may often be prevented. Depending on your health history and family history, you may need to have cancer screening at various ages. This may include screening for:  Colorectal cancer.  Prostate cancer.  Skin cancer.  Lung cancer. What should I know about heart disease, diabetes, and high blood pressure? Blood pressure and heart disease  High blood pressure causes heart disease and increases the risk of stroke. This is more likely to develop in people who have high blood pressure readings, are of African descent, or are overweight.  Talk with your health care provider about your target blood pressure readings.  Have your blood pressure checked: ? Every  3-5 years if you are 12-11 years of age. ? Every year if you are 23 years old or older.  If you are between the ages of 18 and 8 and are a current or former smoker, ask your health care provider if you should have a one-time screening for abdominal aortic aneurysm (AAA). Diabetes Have regular diabetes screenings. This checks your fasting blood  sugar level. Have the screening done:  Once every three years after age 48 if you are at a normal weight and have a low risk for diabetes.  More often and at a younger age if you are overweight or have a high risk for diabetes. What should I know about preventing infection? Hepatitis B If you have a higher risk for hepatitis B, you should be screened for this virus. Talk with your health care provider to find out if you are at risk for hepatitis B infection. Hepatitis C Blood testing is recommended for:  Everyone born from [birth year] through 1966.  Anyone with known risk factors for hepatitis C. Sexually transmitted infections (STIs)  You should be screened each year for STIs, including gonorrhea and chlamydia, if: ? You are sexually active and are younger than 65 years of age. ? You are older than 65 years of age and your health care provider tells you that you are at risk for this type of infection. ? Your sexual activity has changed since you were last screened, and you are at increased risk for chlamydia or gonorrhea. Ask your health care provider if you are at risk.  Ask your health care provider about whether you are at high risk for HIV. Your health care provider may recommend a prescription medicine to help prevent HIV infection. If you choose to take medicine to prevent HIV, you should first get tested for HIV. You should then be tested every 3 months for as long as you are taking the medicine. Follow these instructions at home: Lifestyle  Do not use any products that contain nicotine or tobacco, such as cigarettes, e-cigarettes, and chewing tobacco. If you need help quitting, ask your health care provider.  Do not use street drugs.  Do not share needles.  Ask your health care provider for help if you need support or information about quitting drugs. Alcohol use  Do not drink alcohol if your health care provider tells you not to drink.  If you drink alcohol: ? Limit how much  you have to 0-2 drinks a day. ? Be aware of how much alcohol is in your drink. In the [place] one drink equals one 12 oz bottle of beer (355 mL), one 5 oz glass of wine (148 mL), or one 1 oz glass of hard liquor (44 mL). General instructions  Schedule regular health, dental, and eye exams.  Stay current with your vaccines.  Tell your health care provider if: ? You often feel depressed. ? You have ever been abused or do not feel safe at home. Summary  Adopting a healthy lifestyle and getting preventive care are important in promoting health and wellness.  Follow your health care provider's instructions about healthy diet, exercising, and getting tested or screened for diseases.  Follow your health care provider's instructions on monitoring your cholesterol and blood pressure. This information is not intended to replace advice given to you by your health care provider. Make sure you discuss any questions you have with your health care provider. Document Revised: 04/29/2018 Document Reviewed: 04/29/2018 Elsevier Patient Education  2020 [employer].

## 2019-08-13 LAB — CBC WITH DIFFERENTIAL/PLATELET
Basophils Absolute: 0.1 10*3/uL (ref 0.0–0.2)
Basos: 1 %
EOS (ABSOLUTE): 0.5 10*3/uL — ABNORMAL HIGH (ref 0.0–0.4)
Eos: 10 %
Hematocrit: 44 % (ref 37.5–51.0)
Hemoglobin: 14.1 g/dL (ref 13.0–17.7)
Immature Grans (Abs): 0 10*3/uL (ref 0.0–0.1)
Immature Granulocytes: 0 %
Lymphocytes Absolute: 2.3 10*3/uL (ref 0.7–3.1)
Lymphs: 43 %
MCH: 25 pg — ABNORMAL LOW (ref 26.6–33.0)
MCHC: 32 g/dL (ref 31.5–35.7)
MCV: 78 fL — ABNORMAL LOW (ref 79–97)
Monocytes Absolute: 0.5 10*3/uL (ref 0.1–0.9)
Monocytes: 9 %
Neutrophils Absolute: 2 10*3/uL (ref 1.4–7.0)
Neutrophils: 37 %
Platelets: 211 10*3/uL (ref 150–450)
RBC: 5.63 x10E6/uL (ref 4.14–5.80)
RDW: 13.9 % (ref 11.6–15.4)
WBC: 5.4 10*3/uL (ref 3.4–10.8)

## 2019-08-13 LAB — TSH: TSH: 0.924 u[IU]/mL (ref 0.450–4.500)

## 2019-08-13 LAB — COMPREHENSIVE METABOLIC PANEL
ALT: 23 IU/L (ref 0–44)
AST: 40 IU/L (ref 0–40)
Albumin/Globulin Ratio: 1.5 (ref 1.2–2.2)
Albumin: 4.1 g/dL (ref 3.8–4.8)
Alkaline Phosphatase: 121 IU/L — ABNORMAL HIGH (ref 39–117)
BUN/Creatinine Ratio: 14 (ref 10–24)
BUN: 22 mg/dL (ref 8–27)
Bilirubin Total: 0.3 mg/dL (ref 0.0–1.2)
CO2: 23 mmol/L (ref 20–29)
Calcium: 9.2 mg/dL (ref 8.6–10.2)
Chloride: 103 mmol/L (ref 96–106)
Creatinine, Ser: 1.6 mg/dL — ABNORMAL HIGH (ref 0.76–1.27)
GFR calc Af Amer: 52 mL/min/{1.73_m2} — ABNORMAL LOW (ref >59)
GFR calc non Af Amer: 45 mL/min/{1.73_m2} — ABNORMAL LOW (ref >59)
Globulin, Total: 2.7 g/dL (ref 1.5–4.5)
Glucose: 114 mg/dL — ABNORMAL HIGH (ref 65–99)
Potassium: 4.5 mmol/L (ref 3.5–5.2)
Sodium: 138 mmol/L (ref 134–144)
Total Protein: 6.8 g/dL (ref 6.0–8.5)

## 2019-08-13 LAB — LIPID PANEL W/O CHOL/HDL RATIO
Cholesterol, Total: 128 mg/dL (ref 100–199)
HDL: 50 mg/dL (ref >39)
LDL Chol Calc (NIH): 64 mg/dL (ref 0–99)
Triglycerides: 70 mg/dL (ref 0–149)
VLDL Cholesterol Cal: 14 mg/dL (ref 5–40)

## 2019-08-13 LAB — PSA: Prostate Specific Ag, Serum: 0.4 ng/mL (ref 0.0–4.0)

## 2019-08-14 ENCOUNTER — Encounter: Payer: Self-pay | Admitting: Family Medicine

## 2019-09-14 ENCOUNTER — Telehealth (INDEPENDENT_AMBULATORY_CARE_PROVIDER_SITE_OTHER): Payer: Self-pay | Admitting: Gastroenterology

## 2019-09-14 ENCOUNTER — Other Ambulatory Visit: Payer: Self-pay

## 2019-09-14 DIAGNOSIS — Z1211 Encounter for screening for malignant neoplasm of colon: Secondary | ICD-10-CM

## 2019-09-14 NOTE — Progress Notes (Signed)
Gastroenterology Pre-Procedure Review  Request Date: Friday 10/01/19 Requesting Physician: Dr. Marius Ditch  PATIENT REVIEW QUESTIONS: The patient responded to the following health history questions as indicated:    1. Are you having any GI issues? no 2. Do you have a personal history of Polyps? yes (2015) 3. Do you have a family history of Colon Cancer or Polyps? no 4. Diabetes Mellitus? no 5. Joint replacements in the past 12 months?no 6. Major health problems in the past 3 months?no 7. Any artificial heart valves, MVP, or defibrillator?no    MEDICATIONS & ALLERGIES:    Patient reports the following regarding taking any anticoagulation/antiplatelet therapy:   Plavix, Coumadin, Eliquis, Xarelto, Lovenox, Pradaxa, Brilinta, or Effient? no Aspirin? no  Patient confirms/reports the following medications:  Current Outpatient Medications  Medication Sig Dispense Refill  . albuterol (VENTOLIN HFA) 108 (90 Base) MCG/ACT inhaler INHALE 2 PUFFS INTO THE LUNGS EVERY 6  HOURS AS NEEDED FOR WHEEZING OR SHORTNESS OF BREATH. 18 g 6  . aspirin EC 81 MG tablet Take 81 mg by mouth daily.     Marland Kitchen EPINEPHrine 0.3 mg/0.3 mL IJ SOAJ injection     . lisinopril (ZESTRIL) 40 MG tablet Take 1 tablet (40 mg total) by mouth daily. 90 tablet 1  . metoprolol tartrate (LOPRESSOR) 25 MG tablet Take 1 tablet (25 mg total) by mouth 2 (two) times daily. 180 tablet 1  . naproxen (NAPROSYN) 500 MG tablet Take 1 tablet (500 mg total) by mouth 2 (two) times daily with a meal. 180 tablet 3  . simvastatin (ZOCOR) 40 MG tablet Take 1 tablet (40 mg total) by mouth daily. 90 tablet 1  . tamsulosin (FLOMAX) 0.4 MG CAPS capsule Take 1 capsule (0.4 mg total) by mouth daily. 90 capsule 1   No current facility-administered medications for this visit.    Patient confirms/reports the following allergies:  Allergies  Allergen Reactions  . Shellfish Allergy Anaphylaxis  . Peanuts [Peanut Oil] Rash  . Tuna [Fish Allergy] Hives    No  orders of the defined types were placed in this encounter.   AUTHORIZATION INFORMATION Primary Insurance: 1D#: Group #:  Secondary Insurance: 1D#: Group #:  SCHEDULE INFORMATION: Date: May 14th, 2021 Time: Location:[facility]

## 2019-09-14 NOTE — Progress Notes (Signed)
Gastroenterology Pre-Procedure Review  Request Date: Friday 10/01/19 Requesting Physician: Dr. Marius Ditch  PATIENT REVIEW QUESTIONS: The patient responded to the following health history questions as indicated:    1. Are you having any GI issues? no 2. Do you have a personal history of Polyps? yes (2015) 3. Do you have a family history of Colon Cancer or Polyps? no 4. Diabetes Mellitus? no 5. Joint replacements in the past 12 months?no 6. Major health problems in the past 3 months?no 7. Any artificial heart valves, MVP, or defibrillator?no    MEDICATIONS & ALLERGIES:    Patient reports the following regarding taking any anticoagulation/antiplatelet therapy:   Plavix, Coumadin, Eliquis, Xarelto, Lovenox, Pradaxa, Brilinta, or Effient? no Aspirin? no  Patient confirms/reports the following medications:  Current Outpatient Medications  Medication Sig Dispense Refill  . albuterol (VENTOLIN HFA) 108 (90 Base) MCG/ACT inhaler INHALE 2 PUFFS INTO THE LUNGS EVERY 6  HOURS AS NEEDED FOR WHEEZING OR SHORTNESS OF BREATH. 18 g 6  . aspirin EC 81 MG tablet Take 81 mg by mouth daily.     Marland Kitchen EPINEPHrine 0.3 mg/0.3 mL IJ SOAJ injection     . lisinopril (ZESTRIL) 40 MG tablet Take 1 tablet (40 mg total) by mouth daily. 90 tablet 1  . metoprolol tartrate (LOPRESSOR) 25 MG tablet Take 1 tablet (25 mg total) by mouth 2 (two) times daily. 180 tablet 1  . naproxen (NAPROSYN) 500 MG tablet Take 1 tablet (500 mg total) by mouth 2 (two) times daily with a meal. 180 tablet 3  . simvastatin (ZOCOR) 40 MG tablet Take 1 tablet (40 mg total) by mouth daily. 90 tablet 1  . tamsulosin (FLOMAX) 0.4 MG CAPS capsule Take 1 capsule (0.4 mg total) by mouth daily. 90 capsule 1   No current facility-administered medications for this visit.    Patient confirms/reports the following allergies:  Allergies  Allergen Reactions  . Shellfish Allergy Anaphylaxis  . Peanuts [Peanut Oil] Rash  . Tuna [Fish Allergy] Hives    No  orders of the defined types were placed in this encounter.   AUTHORIZATION INFORMATION Primary Insurance: 1D#: Group #:  Secondary Insurance: 1D#: Group #:  SCHEDULE INFORMATION: Date: Friday 05/14/21Time: Location:[facility]

## 2019-09-17 ENCOUNTER — Telehealth: Payer: Self-pay

## 2019-09-17 NOTE — Telephone Encounter (Signed)
Unsuccessful attempt at calling patient to notify them that it is time to schedule the low dose lung cancer screening CT scan. 

## 2019-09-23 ENCOUNTER — Telehealth: Payer: Self-pay

## 2019-09-23 DIAGNOSIS — Z122 Encounter for screening for malignant neoplasm of respiratory organs: Secondary | ICD-10-CM

## 2019-09-23 DIAGNOSIS — Z87891 Personal history of nicotine dependence: Secondary | ICD-10-CM

## 2019-09-23 NOTE — Telephone Encounter (Signed)
Attempted to call patient today to schedule annual lung screening CT scan.  Mobile number went to voice mail through a message saying the user subscribes to a screening service from [employer].  I left message, but unsure if recipient will receive. I left phone number for Burgess Estelle, lung navigator for patient to call and make appointment for lung CT screening scan. Of note, upon review of chart, patient has a colonoscopy scheduled for 10/01/2019.

## 2019-09-27 NOTE — Addendum Note (Signed)
Addended by: Lieutenant Diego on: 09/27/2019 11:03 AM   Modules accepted: Orders

## 2019-09-27 NOTE — Telephone Encounter (Signed)
Patient has been notified that annual lung cancer screening low dose CT scan is due currently or will be in near future. Confirmed that patient is within the age range of 55-77, and asymptomatic, (no signs or symptoms of lung cancer). Patient denies illness that would prevent curative treatment for lung cancer if found. Verified smoking history, (former, quit 03/2016, 30 pack year). The shared decision making visit was done 05/07/16. Patient is agreeable for CT scan being scheduled.

## 2019-09-27 NOTE — Telephone Encounter (Signed)
Patient returned message and left voicemail of his own. Attempted to contact patient and left voicemail.

## 2019-09-29 ENCOUNTER — Other Ambulatory Visit
Admission: RE | Admit: 2019-09-29 | Discharge: 2019-09-29 | Disposition: A | Discharge status: Home / Self Care | Payer: Medicare HMO | Source: Ambulatory Visit | Attending: Gastroenterology | Admitting: Gastroenterology

## 2019-09-29 DIAGNOSIS — Z20822 Contact with and (suspected) exposure to covid-19: Secondary | ICD-10-CM | POA: Diagnosis not present

## 2019-09-29 DIAGNOSIS — Z01812 Encounter for preprocedural laboratory examination: Secondary | ICD-10-CM | POA: Diagnosis not present

## 2019-09-29 LAB — SARS CORONAVIRUS 2 (TAT 6-24 HRS): SARS Coronavirus 2: NEGATIVE

## 2019-09-30 ENCOUNTER — Encounter: Payer: Self-pay | Admitting: Gastroenterology

## 2019-09-30 ENCOUNTER — Telehealth: Payer: Self-pay

## 2019-09-30 NOTE — Telephone Encounter (Signed)
Message left informing patient of low dose lung cancer screening CT scan appointment on 10/06/19 @ 11:35.

## 2019-10-01 ENCOUNTER — Encounter
Admission: RE | Disposition: A | Discharge status: Home / Self Care | Payer: Self-pay | Source: Home / Self Care | Attending: Gastroenterology

## 2019-10-01 ENCOUNTER — Ambulatory Visit: Payer: Medicare HMO | Admitting: Anesthesiology

## 2019-10-01 ENCOUNTER — Ambulatory Visit
Admission: RE | Admit: 2019-10-01 | Discharge: 2019-10-01 | Disposition: A | Discharge status: Home / Self Care | Payer: Medicare HMO | Attending: Gastroenterology | Admitting: Gastroenterology

## 2019-10-01 ENCOUNTER — Encounter: Payer: Self-pay | Admitting: Gastroenterology

## 2019-10-01 ENCOUNTER — Other Ambulatory Visit: Payer: Self-pay

## 2019-10-01 DIAGNOSIS — Z79899 Other long term (current) drug therapy: Secondary | ICD-10-CM | POA: Insufficient documentation

## 2019-10-01 DIAGNOSIS — E785 Hyperlipidemia, unspecified: Secondary | ICD-10-CM | POA: Diagnosis not present

## 2019-10-01 DIAGNOSIS — I2510 Atherosclerotic heart disease of native coronary artery without angina pectoris: Secondary | ICD-10-CM | POA: Insufficient documentation

## 2019-10-01 DIAGNOSIS — N189 Chronic kidney disease, unspecified: Secondary | ICD-10-CM | POA: Diagnosis not present

## 2019-10-01 DIAGNOSIS — N183 Chronic kidney disease, stage 3 unspecified: Secondary | ICD-10-CM | POA: Diagnosis not present

## 2019-10-01 DIAGNOSIS — Z87891 Personal history of nicotine dependence: Secondary | ICD-10-CM | POA: Insufficient documentation

## 2019-10-01 DIAGNOSIS — D126 Benign neoplasm of colon, unspecified: Secondary | ICD-10-CM

## 2019-10-01 DIAGNOSIS — Z1211 Encounter for screening for malignant neoplasm of colon: Secondary | ICD-10-CM | POA: Diagnosis not present

## 2019-10-01 DIAGNOSIS — Z86010 Personal history of colonic polyps: Secondary | ICD-10-CM | POA: Diagnosis not present

## 2019-10-01 DIAGNOSIS — J449 Chronic obstructive pulmonary disease, unspecified: Secondary | ICD-10-CM | POA: Diagnosis not present

## 2019-10-01 DIAGNOSIS — I129 Hypertensive chronic kidney disease with stage 1 through stage 4 chronic kidney disease, or unspecified chronic kidney disease: Secondary | ICD-10-CM | POA: Insufficient documentation

## 2019-10-01 DIAGNOSIS — Z8719 Personal history of other diseases of the digestive system: Secondary | ICD-10-CM | POA: Diagnosis not present

## 2019-10-01 DIAGNOSIS — M1990 Unspecified osteoarthritis, unspecified site: Secondary | ICD-10-CM | POA: Insufficient documentation

## 2019-10-01 DIAGNOSIS — I252 Old myocardial infarction: Secondary | ICD-10-CM | POA: Diagnosis not present

## 2019-10-01 DIAGNOSIS — Z7982 Long term (current) use of aspirin: Secondary | ICD-10-CM | POA: Diagnosis not present

## 2019-10-01 HISTORY — PX: COLONOSCOPY WITH PROPOFOL: SHX5780

## 2019-10-01 SURGERY — COLONOSCOPY WITH PROPOFOL
Anesthesia: General

## 2019-10-01 MED ORDER — MIDAZOLAM HCL 2 MG/2ML IJ SOLN
INTRAMUSCULAR | Status: DC | PRN
  Administered 2019-10-01: 2 mg via INTRAVENOUS

## 2019-10-01 MED ORDER — PROPOFOL 500 MG/50ML IV EMUL
INTRAVENOUS | Status: DC | PRN
  Administered 2019-10-01: 150 ug/kg/min via INTRAVENOUS

## 2019-10-01 MED ORDER — FENTANYL CITRATE (PF) 100 MCG/2ML IJ SOLN
INTRAMUSCULAR | Status: DC | PRN
  Administered 2019-10-01: 50 ug via INTRAVENOUS

## 2019-10-01 MED ORDER — PROPOFOL 500 MG/50ML IV EMUL
INTRAVENOUS | Status: AC
  Filled 2019-10-01: qty 50

## 2019-10-01 MED ORDER — MIDAZOLAM HCL 2 MG/2ML IJ SOLN
INTRAMUSCULAR | Status: AC
  Filled 2019-10-01: qty 2

## 2019-10-01 MED ORDER — FENTANYL CITRATE (PF) 100 MCG/2ML IJ SOLN
INTRAMUSCULAR | Status: AC
  Filled 2019-10-01: qty 2

## 2019-10-01 MED ORDER — SODIUM CHLORIDE 0.9 % IV SOLN
INTRAVENOUS | Status: DC
  Administered 2019-10-01: 1000 mL via INTRAVENOUS

## 2019-10-01 NOTE — Anesthesia Postprocedure Evaluation (Signed)
Anesthesia Post Note  Patient: Mike [name].  Procedure(s) Performed: COLONOSCOPY WITH PROPOFOL (N/A )  Patient location during evaluation: Endoscopy Anesthesia Type: General Level of consciousness: awake and alert Pain management: pain level controlled Vital Signs Assessment: post-procedure vital signs reviewed and stable Respiratory status: spontaneous breathing, nonlabored ventilation, respiratory function stable and patient connected to nasal cannula oxygen Cardiovascular status: blood pressure returned to baseline and stable Postop Assessment: no apparent nausea or vomiting Anesthetic complications: no     Last Vitals:  Vitals:   10/01/19 1214 10/01/19 1224  BP: 128/73 (!) 143/77  Pulse: 69 60  Resp: 14 18  Temp:    SpO2: 96% 96%    Last Pain:  Vitals:   10/01/19 1224  TempSrc:   PainSc: 0-No pain                 Martha Clan

## 2019-10-01 NOTE — Op Note (Addendum)
[facility] Gastroenterology Patient Name: Mike [name] Procedure Date: 10/01/2019 11:21 AM MRN: [id number] Account #: [id number] Date of Birth: 08/17/1955 Admit Type: Outpatient Age: 64 Room: [facility] ENDO ROOM 3 Gender: Male Note Status: Finalized Procedure:             Colonoscopy Indications:           Surveillance: Personal history of adenomatous polyps                         on last colonoscopy 5 years ago, Last colonoscopy:                         December 2015 Providers:             Lin Landsman MD, MD Referring MD:          Valerie Roys (Referring MD) Medicines:             Monitored Anesthesia Care Complications:         No immediate complications. Estimated blood loss: None. Procedure:             Pre-Anesthesia Assessment:                        - Prior to the procedure, a History and Physical was                         performed, and patient medications and allergies were                         reviewed. The patient is competent. The risks and                         benefits of the procedure and the sedation options and                         risks were discussed with the patient. All questions                         were answered and informed consent was obtained.                         Patient identification and proposed procedure were                         verified by the physician, the nurse, the                         anesthesiologist, the anesthetist and the technician                         in the pre-procedure area in the procedure room in the                         endoscopy suite. Mental Status Examination: alert and                         oriented. Airway Examination: normal oropharyngeal  airway and neck mobility. Respiratory Examination:                         clear to auscultation. CV Examination: normal.                         Prophylactic Antibiotics: The patient does not require                          prophylactic antibiotics. Prior Anticoagulants: The                         patient has taken no previous anticoagulant or                         antiplatelet agents. ASA Grade Assessment: II - A                         patient with mild systemic disease. After reviewing                         the risks and benefits, the patient was deemed in                         satisfactory condition to undergo the procedure. The                         anesthesia plan was to use monitored anesthesia care                         (MAC). Immediately prior to administration of                         medications, the patient was re-assessed for adequacy                         to receive sedatives. The heart rate, respiratory                         rate, oxygen saturations, blood pressure, adequacy of                         pulmonary ventilation, and response to care were                         monitored throughout the procedure. The physical                         status of the patient was re-assessed after the                         procedure.                        After obtaining informed consent, the colonoscope was                         passed under direct vision. Throughout the procedure,  the patient's blood pressure, pulse, and oxygen                         saturations were monitored continuously. The                         Colonoscope was introduced through the anus and                         advanced to the the terminal ileum, with                         identification of the appendiceal orifice and IC                         valve. The colonoscopy was performed without                         difficulty. The patient tolerated the procedure well.                         The quality of the bowel preparation was evaluated                         using the BBPS [facility] Bowel Preparation Scale) with                         scores of: Right Colon = 3,  Transverse Colon = 3 and                         Left Colon = 3 (entire mucosa seen well with no                         residual staining, small fragments of stool or opaque                         liquid). The total BBPS score equals 9. Findings:      The perianal and digital rectal examinations were normal. Pertinent       negatives include normal sphincter tone and no palpable rectal lesions.      The terminal ileum appeared normal.      The entire examined colon appeared normal.      The retroflexed view of the distal rectum and anal verge was normal and       showed no anal or rectal abnormalities. Impression:            - The examined portion of the ileum was normal.                        - The entire examined colon is normal.                        - The distal rectum and anal verge are normal on                         retroflexion view.                        -  No specimens collected. Recommendation:        - Discharge patient to home (with escort).                        - Resume previous diet today.                        - Continue present medications.                        - Repeat colonoscopy in 10 years for surveillance. Procedure Code(s):     --- Professional ---                        F6384, Colorectal cancer screening; colonoscopy on                         individual at high risk Diagnosis Code(s):     --- Professional ---                        Z86.010, Personal history of colonic polyps CPT copyright 2019 American Medical Association. All rights reserved. The codes documented in this report are preliminary and upon coder review may  be revised to meet current compliance requirements. Dr. Ulyess Mort Lin Landsman MD, MD 10/01/2019 11:46:13 AM This report has been signed electronically. Number of Addenda: 0 Note Initiated On: 10/01/2019 11:21 AM Scope Withdrawal Time: 0 hours 9 minutes 6 seconds  Total Procedure Duration: 0 hours 11 minutes 43 seconds   Estimated Blood Loss:  Estimated blood loss: none.      [facility]

## 2019-10-01 NOTE — H&P (Addendum)
Mike Darby, MD [address]  [place]  [place], [place] [address]  Main: [phone]  Fax: [phone] Pager: [phone]  Primary Care Physician:  Valerie Roys, DO Primary Gastroenterologist:  Dr. Cephas [name]  Pre-Procedure History & Physical: HPI:  Mike Serratore Kontos [name] is a 65 y.o. male is here for an colonoscopy.   Past Medical History:  Diagnosis Date  . Arthritis   . Benign enlargement of prostate   . CAD (coronary artery disease)   . Chronic kidney disease   . History of seizures as a child    Due to head injury  . Hyperlipidemia   . Hypertension   . Lumbago   . Myocardial infarction ([place])   . Urinary hesitancy     Past Surgical History:  Procedure Laterality Date  . coronary artery stent      Prior to Admission medications   Medication Sig Start Date End Date Taking? Authorizing Provider  aspirin EC 81 MG tablet Take 81 mg by mouth daily.    Yes [provider]  lisinopril (ZESTRIL) 40 MG tablet Take 1 tablet (40 mg total) by mouth daily. 08/12/19  Yes Johnson, Megan P, DO  metoprolol tartrate (LOPRESSOR) 25 MG tablet Take 1 tablet (25 mg total) by mouth 2 (two) times daily. 08/12/19  Yes Johnson, Megan P, DO  naproxen (NAPROSYN) 500 MG tablet Take 1 tablet (500 mg total) by mouth 2 (two) times daily with a meal. 08/12/19  Yes Johnson, Megan P, DO  simvastatin (ZOCOR) 40 MG tablet Take 1 tablet (40 mg total) by mouth daily. 08/12/19  Yes Johnson, Megan P, DO  tamsulosin (FLOMAX) 0.4 MG CAPS capsule Take 1 capsule (0.4 mg total) by mouth daily. 08/12/19  Yes Johnson, Megan P, DO  albuterol (VENTOLIN HFA) 108 (90 Base) MCG/ACT inhaler INHALE 2 PUFFS INTO THE LUNGS EVERY 6  HOURS AS NEEDED FOR WHEEZING OR SHORTNESS OF BREATH. 08/12/19   Park Liter P, DO  EPINEPHrine 0.3 mg/0.3 mL IJ SOAJ injection  04/09/17   [provider]    Allergies as of 09/14/2019 - Review Complete 09/14/2019  Allergen Reaction Noted  . Shellfish  allergy Anaphylaxis 03/17/2015  . Peanuts [peanut oil] Rash 03/17/2015  . Geralyn Flash [fish allergy] Hives 03/17/2015    Family History  Problem Relation Age of Onset  . Hypertension Mother   . Breast cancer Mother   . Hypertension Father   . Stroke Father   . Stroke Maternal Grandfather     Social History   Socioeconomic History  . Marital status: Divorced    Spouse name: Not on file  . Number of children: Not on file  . Years of education: Not on file  . Highest education level: Some college, no degree  Occupational History  . Not on file  Tobacco Use  . Smoking status: Former Smoker    Packs/day: 1.00    Years: 30.00    Pack years: 30.00    Types: Cigarettes    Quit date: 04/12/2016    Years since quitting: 3.4  . Smokeless tobacco: Never Used  . Tobacco comment: since 15, max 1 ppd x10  Substance and Sexual Activity  . Alcohol use: No    Alcohol/week: 0.0 standard drinks  . Drug use: No  . Sexual activity: Yes    Birth control/protection: None  Other Topics Concern  . Not on file  Social History Narrative   Goes to gym 6-7 days a week  Personal trainer    Social Determinants of Health   Financial Resource Strain: Low Risk   . Difficulty of Paying Living Expenses: Not hard at all  Food Insecurity: No Food Insecurity  . Worried About Charity fundraiser in the Last Year: Never true  . Ran Out of Food in the Last Year: Never true  Transportation Needs: No Transportation Needs  . Lack of Transportation (Medical): No  . Lack of Transportation (Non-Medical): No  Physical Activity: Sufficiently Active  . Days of Exercise per Week: 7 days  . Minutes of Exercise per Session: 60 min  Stress:   . Feeling of Stress :   Social Connections: Somewhat Isolated  . Frequency of Communication with Friends and Family: More than three times a week  . Frequency of Social Gatherings with Friends and Family: More than three times a week  . Attends Religious Services: Never  .  Active Member of Clubs or Organizations: Yes  . Attends Archivist Meetings: More than 4 times per year  . Marital Status: Divorced  Human resources officer Violence:   . Fear of Current or Ex-Partner:   . Emotionally Abused:   Marland Kitchen Physically Abused:   . Sexually Abused:     Review of Systems: See HPI, otherwise negative ROS  Physical Exam: BP (!) 148/64   Pulse (!) 57   Temp (!) 97.1 F (36.2 C) (Temporal)   Resp 18   Ht 5\' 5"  (1.651 m)   Wt 95.5 kg   SpO2 99%   BMI 35.02 kg/m  General:   Alert,  pleasant and cooperative in NAD Head:  Normocephalic and atraumatic. Neck:  Supple; no masses or thyromegaly. Lungs:  Clear throughout to auscultation.    Heart:  Regular rate and rhythm. Abdomen:  Soft, nontender and nondistended. Normal bowel sounds, without guarding, and without rebound.   Neurologic:  Alert and  oriented x4;  grossly normal neurologically.  Impression/Plan: Mike Gallo [employer]. is here for an colonoscopy to be performed for h/o tubular adenoma colon  Risks, benefits, limitations, and alternatives regarding  colonoscopy have been reviewed with the patient.  Questions have been answered.  All parties agreeable.   Sherri Sear, MD  10/01/2019, 10:52 AM

## 2019-10-01 NOTE — Anesthesia Procedure Notes (Signed)
Performed by: Cook-Martin, Earnest Mcgillis Pre-anesthesia Checklist: Patient identified, Emergency Drugs available, Suction available, Patient being monitored and Timeout performed Patient Re-evaluated:Patient Re-evaluated prior to induction Oxygen Delivery Method: Nasal cannula Preoxygenation: Pre-oxygenation with 100% oxygen Induction Type: IV induction Placement Confirmation: positive ETCO2 and CO2 detector       

## 2019-10-01 NOTE — Transfer of Care (Signed)
Immediate Anesthesia Transfer of Care Note  Patient: Mike Buonocore Wavra Jr.  Procedure(s) Performed: COLONOSCOPY WITH PROPOFOL (N/A )  Patient Location: PACU  Anesthesia Type:General  Level of Consciousness: awake and sedated  Airway & Oxygen Therapy: Patient Spontanous Breathing and Patient connected to nasal cannula oxygen  Post-op Assessment: Report given to RN and Post -op Vital signs reviewed and stable  Post vital signs: Reviewed and stable  Last Vitals:  Vitals Value Taken Time  BP    Temp    Pulse    Resp    SpO2      Last Pain:  Vitals:   10/01/19 1025  TempSrc: Temporal  PainSc: 0-No pain         Complications: No apparent anesthesia complications

## 2019-10-01 NOTE — Anesthesia Preprocedure Evaluation (Signed)
Anesthesia Evaluation  Patient identified by MRN, date of birth, ID band Patient awake    Reviewed: Allergy & Precautions, H&P , NPO status , Patient's Chart, lab work & pertinent test results, reviewed documented beta blocker date and time   History of Anesthesia Complications Negative for: history of anesthetic complications  Airway Mallampati: III  TM Distance: >3 FB Neck ROM: full    Dental  (+) Dental Advidsory Given, Upper Dentures, Lower Dentures   Pulmonary neg shortness of breath, neg sleep apnea, COPD, neg recent URI, former smoker,    Pulmonary exam normal breath sounds clear to auscultation       Cardiovascular Exercise Tolerance: Good hypertension, (-) angina+ CAD, + Past MI and + Cardiac Stents  (-) CABG Normal cardiovascular exam(-) dysrhythmias (-) Valvular Problems/Murmurs Rhythm:regular Rate:Normal     Neuro/Psych negative neurological ROS  negative psych ROS   GI/Hepatic negative GI ROS, Neg liver ROS,   Endo/Other  negative endocrine ROS  Renal/GU CRFRenal disease  negative genitourinary   Musculoskeletal   Abdominal   Peds  Hematology negative hematology ROS (+)   Anesthesia Other Findings Past Medical History: No date: Arthritis No date: Benign enlargement of prostate No date: CAD (coronary artery disease) No date: Chronic kidney disease No date: History of seizures as a child     Comment:  Due to head injury No date: Hyperlipidemia No date: Hypertension No date: Lumbago No date: Myocardial infarction ([facility]) No date: Urinary hesitancy   Reproductive/Obstetrics negative OB ROS                             Anesthesia Physical Anesthesia Plan  ASA: III  Anesthesia Plan: General   Post-op Pain Management:    Induction: Intravenous  PONV Risk Score and Plan: 2 and Propofol infusion and TIVA  Airway Management Planned: Natural Airway and Nasal  Cannula  Additional Equipment:   Intra-op Plan:   Post-operative Plan:   Informed Consent: I have reviewed the patients History and Physical, chart, labs and discussed the procedure including the risks, benefits and alternatives for the proposed anesthesia with the patient or authorized representative who has indicated his/her understanding and acceptance.     Dental Advisory Given  Plan Discussed with: Anesthesiologist, CRNA and Surgeon  Anesthesia Plan Comments:         Anesthesia Quick Evaluation

## 2019-10-04 NOTE — Telephone Encounter (Signed)
Left message for patient to not arrive for CT chest on the 19th as scheduled due to delay in insurance auth.

## 2019-10-06 ENCOUNTER — Ambulatory Visit
Admission: RE | Admit: 2019-10-06 | Discharge: 2019-10-06 | Disposition: A | Discharge status: Home / Self Care | Payer: Medicare HMO | Source: Ambulatory Visit | Attending: Oncology | Admitting: Oncology

## 2019-10-06 ENCOUNTER — Other Ambulatory Visit: Payer: Self-pay

## 2019-10-06 ENCOUNTER — Ambulatory Visit: Payer: Medicare HMO

## 2019-10-06 DIAGNOSIS — Z122 Encounter for screening for malignant neoplasm of respiratory organs: Secondary | ICD-10-CM | POA: Diagnosis not present

## 2019-10-06 DIAGNOSIS — Z87891 Personal history of nicotine dependence: Secondary | ICD-10-CM | POA: Diagnosis not present

## 2019-10-08 ENCOUNTER — Encounter: Payer: Self-pay | Admitting: *Deleted

## 2020-02-09 ENCOUNTER — Other Ambulatory Visit: Payer: Self-pay | Admitting: Family Medicine

## 2020-02-09 NOTE — Telephone Encounter (Signed)
Courtesy refill. Pt has appt 02/14/20

## 2020-02-14 ENCOUNTER — Encounter: Payer: Self-pay | Admitting: Family Medicine

## 2020-02-14 ENCOUNTER — Other Ambulatory Visit: Payer: Self-pay

## 2020-02-14 ENCOUNTER — Ambulatory Visit (INDEPENDENT_AMBULATORY_CARE_PROVIDER_SITE_OTHER): Payer: Medicare HMO | Admitting: Family Medicine

## 2020-02-14 VITALS — BP 138/74 | HR 51 | Temp 97.8°F | Wt 210.0 lb

## 2020-02-14 DIAGNOSIS — M25511 Pain in right shoulder: Secondary | ICD-10-CM | POA: Diagnosis not present

## 2020-02-14 DIAGNOSIS — G8929 Other chronic pain: Secondary | ICD-10-CM

## 2020-02-14 DIAGNOSIS — Z23 Encounter for immunization: Secondary | ICD-10-CM | POA: Diagnosis not present

## 2020-02-14 DIAGNOSIS — R3911 Hesitancy of micturition: Secondary | ICD-10-CM | POA: Diagnosis not present

## 2020-02-14 DIAGNOSIS — I129 Hypertensive chronic kidney disease with stage 1 through stage 4 chronic kidney disease, or unspecified chronic kidney disease: Secondary | ICD-10-CM

## 2020-02-14 DIAGNOSIS — E782 Mixed hyperlipidemia: Secondary | ICD-10-CM | POA: Diagnosis not present

## 2020-02-14 DIAGNOSIS — N401 Enlarged prostate with lower urinary tract symptoms: Secondary | ICD-10-CM

## 2020-02-14 MED ORDER — LISINOPRIL 40 MG PO TABS: 40.0000 mg | ORAL_TABLET | Freq: Every day | ORAL | 0 refills | Status: DC

## 2020-02-14 MED ORDER — EPINEPHRINE 0.3 MG/0.3ML IJ SOAJ: 0.3000 mg | INTRAMUSCULAR | 12 refills | Status: DC | PRN

## 2020-02-14 MED ORDER — TAMSULOSIN HCL 0.4 MG PO CAPS: 0.4000 mg | ORAL_CAPSULE | Freq: Every day | ORAL | 0 refills | Status: DC

## 2020-02-14 MED ORDER — NAPROXEN 500 MG PO TABS: 500.0000 mg | ORAL_TABLET | Freq: Two times a day (BID) | ORAL | 3 refills | Status: DC

## 2020-02-14 MED ORDER — SIMVASTATIN 40 MG PO TABS: 40.0000 mg | ORAL_TABLET | Freq: Every day | ORAL | 1 refills | Status: DC

## 2020-02-14 MED ORDER — METOPROLOL TARTRATE 25 MG PO TABS: 25.0000 mg | ORAL_TABLET | Freq: Two times a day (BID) | ORAL | 0 refills | Status: DC

## 2020-02-14 NOTE — Assessment & Plan Note (Signed)
Under good control on current regimen. Continue current regimen. Continue to monitor. Call with any concerns. Refills given. Labs drawn today.   

## 2020-02-14 NOTE — Progress Notes (Signed)
BP 138/74   Pulse (!) 51   Temp 97.8 F (36.6 C) (Oral)   Wt 210 lb (95.3 kg)   SpO2 99%   BMI 34.95 kg/m    Subjective:    Patient ID: Mike Desanctis., male    DOB: 11-Oct-1955, 64 y.o.   MRN: [id number]  HPI: Mike Greenblatt Kommer [name] is a 64 y.o. male  Chief Complaint  Patient presents with  . Hypertension  . Hyperlipidemia   SHOULDER PAIN Duration: couple of months Involved shoulder: right Mechanism of injury: unknown Location: anterior Onset:sudden Severity: severe  Quality:  sharp Frequency: intermittent Radiation: no Aggravating factors: lifting and movement  Alleviating factors: ice, HEP, APAP, NSAIDs, sling and rest  Status: fluctuating Treatments attempted: rest, ice, heat, sling, APAP, ibuprofen, aleve and HEP  Relief with NSAIDs?:  mild Weakness: yes Numbness: no Decreased grip strength: no Redness: no Swelling: no Bruising: no Fevers: no   HYPERTENSION / HYPERLIPIDEMIA Satisfied with current treatment? no Duration of hypertension: chronic BP monitoring frequency: not checking BP medication side effects: no Past BP meds:  Duration of hyperlipidemia: chronic Cholesterol medication side effects: no Cholesterol supplements: none Past cholesterol medications: atorvastatin Medication compliance: excellent compliance Aspirin: yes Recent stressors: no Recurrent headaches: no Visual changes: no Palpitations: no Dyspnea: no Chest pain: no Lower extremity edema: no Dizzy/lightheaded: no   BPH BPH status: controlled Satisfied with current treatment?: yes Medication side effects: no Medication compliance: excellent compliance Duration: chronic Nocturia: no Urinary frequency:no Incomplete voiding: no Urgency: no Weak urinary stream: no Straining to start stream: no Dysuria: no Onset: gradual Severity: mild  Relevant past medical, surgical, family and social history reviewed and updated as indicated. Interim medical history  since our last visit reviewed. Allergies and medications reviewed and updated.  Review of Systems  Constitutional: Negative.   Respiratory: Negative.   Cardiovascular: Negative.   Gastrointestinal: Negative.   Musculoskeletal: Positive for arthralgias. Negative for back pain, gait problem, joint swelling, myalgias, neck pain and neck stiffness.  Skin: Negative.   Psychiatric/Behavioral: Negative.     Per HPI unless specifically indicated above     Objective:    BP 138/74   Pulse (!) 51   Temp 97.8 F (36.6 C) (Oral)   Wt 210 lb (95.3 kg)   SpO2 99%   BMI 34.95 kg/m   Wt Readings from Last 3 Encounters:  02/14/20 210 lb (95.3 kg)  10/06/19 212 lb (96.2 kg)  10/01/19 210 lb 6.9 oz (95.5 kg)    Physical Exam Vitals and nursing note reviewed.  Constitutional:      General: He is not in acute distress.    Appearance: Normal appearance. He is not ill-appearing, toxic-appearing or diaphoretic.  HENT:     Head: Normocephalic and atraumatic.     Right Ear: External ear normal.     Left Ear: External ear normal.     Nose: Nose normal.     Mouth/Throat:     Mouth: Mucous membranes are moist.     Pharynx: Oropharynx is clear.  Eyes:     General: No scleral icterus.       Right eye: No discharge.        Left eye: No discharge.     Extraocular Movements: Extraocular movements intact.     Conjunctiva/sclera: Conjunctivae normal.     Pupils: Pupils are equal, round, and reactive to light.  Cardiovascular:     Rate and Rhythm: Normal rate and regular rhythm.  Pulses: Normal pulses.     Heart sounds: Normal heart sounds. No murmur heard.  No friction rub. No gallop.   Pulmonary:     Effort: Pulmonary effort is normal. No respiratory distress.     Breath sounds: Normal breath sounds. No stridor. No wheezing, rhonchi or rales.  Chest:     Chest wall: No tenderness.  Musculoskeletal:        General: Swelling and tenderness present.     Cervical back: Normal range of  motion and neck supple.     Comments: Decreased ROM R shoulder with tenderness on biceptal tendon and AC joint  Skin:    General: Skin is warm and dry.     Capillary Refill: Capillary refill takes less than 2 seconds.     Coloration: Skin is not jaundiced or pale.     Findings: No bruising, erythema, lesion or rash.  Neurological:     General: No focal deficit present.     Mental Status: He is alert and oriented to person, place, and time. Mental status is at baseline.  Psychiatric:        Mood and Affect: Mood normal.        Behavior: Behavior normal.        Thought Content: Thought content normal.        Judgment: Judgment normal.     Results for orders placed or performed during the hospital encounter of 09/29/19  SARS CORONAVIRUS 2 (TAT 6-24 HRS) Nasopharyngeal Nasopharyngeal Swab   Specimen: Nasopharyngeal Swab  Result Value Ref Range   SARS Coronavirus 2 NEGATIVE NEGATIVE      Assessment & Plan:   Problem List Items Addressed This Visit      Genitourinary   Benign hypertensive renal disease    Under good control on current regimen. Continue current regimen. Continue to monitor. Call with any concerns. Refills given. Labs drawn today.       Relevant Orders   Comprehensive metabolic panel     Other   HLD (hyperlipidemia)    Under good control on current regimen. Continue current regimen. Continue to monitor. Call with any concerns. Refills given. Labs drawn today.       Relevant Medications   lisinopril (ZESTRIL) 40 MG tablet   metoprolol tartrate (LOPRESSOR) 25 MG tablet   simvastatin (ZOCOR) 40 MG tablet   EPINEPHrine 0.3 mg/0.3 mL IJ SOAJ injection   Other Relevant Orders   Comprehensive metabolic panel   Lipid Panel w/o Chol/HDL Ratio   Urinary hesitancy due to benign prostatic hyperplasia    Under good control on current regimen. Continue current regimen. Continue to monitor. Call with any concerns. Refills given. Labs drawn today.       Relevant  Orders   PSA    Other Visit Diagnoses    Chronic right shoulder pain    -  Primary   Worsening. AC arthritis in 2019. Will get him into ortho for eval and ?injection. Call with any concerns. Continue to monitor.   Relevant Medications   naproxen (NAPROSYN) 500 MG tablet   Other Relevant Orders   Ambulatory referral to Orthopedic Surgery   Ambulatory referral to Connected Care   Need for pneumococcal vaccine       Pneumovax given today.   Relevant Orders   Pneumococcal polysaccharide vaccine 23-valent greater than or equal to 2yo subcutaneous/IM   Flu vaccine need       Flu shot given today.   Relevant Orders   Flu Vaccine  QUAD High Dose(Fluad)       Follow up plan: Return in about 6 months (around 08/13/2020) for physical.

## 2020-02-15 ENCOUNTER — Other Ambulatory Visit: Payer: Self-pay | Admitting: Family Medicine

## 2020-02-15 ENCOUNTER — Telehealth: Payer: Self-pay

## 2020-02-15 DIAGNOSIS — N183 Chronic kidney disease, stage 3 unspecified: Secondary | ICD-10-CM

## 2020-02-15 LAB — COMPREHENSIVE METABOLIC PANEL
ALT: 18 IU/L (ref 0–44)
AST: 31 IU/L (ref 0–40)
Albumin/Globulin Ratio: 1.6 (ref 1.2–2.2)
Albumin: 4.1 g/dL (ref 3.8–4.8)
Alkaline Phosphatase: 130 IU/L — ABNORMAL HIGH (ref 44–121)
BUN/Creatinine Ratio: 10 (ref 10–24)
BUN: 16 mg/dL (ref 8–27)
Bilirubin Total: 0.3 mg/dL (ref 0.0–1.2)
CO2: 23 mmol/L (ref 20–29)
Calcium: 9.6 mg/dL (ref 8.6–10.2)
Chloride: 102 mmol/L (ref 96–106)
Creatinine, Ser: 1.68 mg/dL — ABNORMAL HIGH (ref 0.76–1.27)
GFR calc Af Amer: 49 mL/min/{1.73_m2} — ABNORMAL LOW (ref >59)
GFR calc non Af Amer: 42 mL/min/{1.73_m2} — ABNORMAL LOW (ref >59)
Globulin, Total: 2.6 g/dL (ref 1.5–4.5)
Glucose: 92 mg/dL (ref 65–99)
Potassium: 4.7 mmol/L (ref 3.5–5.2)
Sodium: 137 mmol/L (ref 134–144)
Total Protein: 6.7 g/dL (ref 6.0–8.5)

## 2020-02-15 LAB — LIPID PANEL W/O CHOL/HDL RATIO
Cholesterol, Total: 154 mg/dL (ref 100–199)
HDL: 55 mg/dL (ref >39)
LDL Chol Calc (NIH): 80 mg/dL (ref 0–99)
Triglycerides: 102 mg/dL (ref 0–149)
VLDL Cholesterol Cal: 19 mg/dL (ref 5–40)

## 2020-02-15 LAB — PSA: Prostate Specific Ag, Serum: 0.5 ng/mL (ref 0.0–4.0)

## 2020-02-15 NOTE — Telephone Encounter (Signed)
Mr Marzan called and he was read lab report by Dr Wynetta Emery sent 02/15/20.  He verbalized understanding of all information.

## 2020-02-15 NOTE — Telephone Encounter (Signed)
LMTCB, PEC may give result.  

## 2020-02-15 NOTE — Telephone Encounter (Signed)
    MA9/28/2021 1st Attempt  Name: Mike [name] [facility].   MRN: [id number]   DOB: [birth year]   AGE: 64 y.o.   GENDER: male   PCP Valerie Roys, DO.   02/15/20 Spoke with patient he was unable to talk and requested that I call him tomorrow with transportation resource.  Will call  patient tomorrow.    Anissa Abbs, AAS Paralegal, [place] . Embedded Care Coordination [facility]  Care Management  [address],  [address] [email or web address]  [phone]   [email or web address]

## 2020-02-15 NOTE — Telephone Encounter (Signed)
FYI

## 2020-02-15 NOTE — Telephone Encounter (Signed)
-----   Message from Valerie Roys, [place] sent at 02/15/2020  1:56 PM EDT ----- Please let him know that his labs look stable, but his kidney function has gotten a little worse, so I would like him to check in with the kidney doctor. I've put in a referral for him and they will call him. Everything else looks good.

## 2020-02-16 ENCOUNTER — Telehealth: Payer: Self-pay

## 2020-02-16 NOTE — Telephone Encounter (Signed)
    MA9/29/2021 2nd Attempt  Name: Mike [name] [facility].   MRN: [id number]   DOB: 12-01-1955   AGE: 63 y.o.   GENDER: male   PCP Valerie Roys, DO.   02/16/20  Left message on voicemail for patient to return my call regarding transportation resource. Will call again this week.    Nija Koopman, AAS Paralegal, [place] . Embedded Care Coordination [facility] Health  Care Management  [address], [place] [address] [email or web address]  [phone]   [email or web address]

## 2020-02-17 ENCOUNTER — Telehealth: Payer: Self-pay

## 2020-02-17 NOTE — Telephone Encounter (Signed)
    MA9/30/2021   Name: [name] [facility].   MRN: [id number]   DOB: 1956/03/18   AGE: 64 y.o.   GENDER: male   PCP Valerie Roys, DO.   02/17/20 Spoke with patient about signing up for [employer] transportation.  Will follow-up with patient in the next few days.    Solita Macadam, AAS Paralegal, [place] . Embedded Care Coordination [facility]  Care Management  [address], [place] [address] [email or web address]  [phone]   [email or web address]

## 2020-02-22 ENCOUNTER — Telehealth: Payer: Self-pay

## 2020-02-22 NOTE — Telephone Encounter (Signed)
    MA10/09/2019 Follow-up call  Name: Mike [name] [facility].   MRN: [id number]   DOB: Feb 02, 1956   AGE: 64 y.o.   GENDER: male   PCP Valerie Roys, DO.   02/22/20 Spoke with patient he has the number and will contact [employer] when transportation is needed.  Explained to patient to give as much notice as possible, also called Sharyn Lull at [employer] to have patient entered in the system.  Patient's brother will bring him to his appointment on 02/23/20.  No other resources are needed at this time. Closing referral.    Errick Salts, AAS Paralegal, [place] . Embedded Care Coordination [facility]  Care Management  [address], [place] [address] [email or web address]  [phone]   [email or web address]

## 2020-02-22 NOTE — Telephone Encounter (Signed)
Noted  

## 2020-02-23 DIAGNOSIS — M7501 Adhesive capsulitis of right shoulder: Secondary | ICD-10-CM | POA: Diagnosis not present

## 2020-02-23 DIAGNOSIS — M19011 Primary osteoarthritis, right shoulder: Secondary | ICD-10-CM | POA: Diagnosis not present

## 2020-02-23 DIAGNOSIS — M25511 Pain in right shoulder: Secondary | ICD-10-CM | POA: Diagnosis not present

## 2020-03-23 DIAGNOSIS — I10 Essential (primary) hypertension: Secondary | ICD-10-CM | POA: Diagnosis not present

## 2020-03-23 DIAGNOSIS — R82998 Other abnormal findings in urine: Secondary | ICD-10-CM | POA: Diagnosis not present

## 2020-03-23 DIAGNOSIS — N1831 Chronic kidney disease, stage 3a: Secondary | ICD-10-CM | POA: Diagnosis not present

## 2020-03-24 ENCOUNTER — Other Ambulatory Visit: Payer: Self-pay | Admitting: Nephrology

## 2020-03-24 DIAGNOSIS — N1831 Chronic kidney disease, stage 3a: Secondary | ICD-10-CM

## 2020-03-24 DIAGNOSIS — R82998 Other abnormal findings in urine: Secondary | ICD-10-CM

## 2020-04-20 DIAGNOSIS — N1831 Chronic kidney disease, stage 3a: Secondary | ICD-10-CM | POA: Diagnosis not present

## 2020-04-20 DIAGNOSIS — I10 Essential (primary) hypertension: Secondary | ICD-10-CM | POA: Diagnosis not present

## 2020-04-20 DIAGNOSIS — I011 Acute rheumatic endocarditis: Secondary | ICD-10-CM | POA: Diagnosis not present

## 2020-06-12 DIAGNOSIS — M4722 Other spondylosis with radiculopathy, cervical region: Secondary | ICD-10-CM | POA: Diagnosis not present

## 2020-06-12 DIAGNOSIS — M5136 Other intervertebral disc degeneration, lumbar region: Secondary | ICD-10-CM | POA: Diagnosis not present

## 2020-06-12 DIAGNOSIS — R768 Other specified abnormal immunological findings in serum: Secondary | ICD-10-CM | POA: Diagnosis not present

## 2020-06-12 DIAGNOSIS — M2550 Pain in unspecified joint: Secondary | ICD-10-CM | POA: Diagnosis not present

## 2020-07-17 ENCOUNTER — Other Ambulatory Visit: Payer: Self-pay | Admitting: Family Medicine

## 2020-08-14 ENCOUNTER — Encounter: Payer: Self-pay | Admitting: Family Medicine

## 2020-08-14 ENCOUNTER — Other Ambulatory Visit: Payer: Self-pay

## 2020-08-14 ENCOUNTER — Ambulatory Visit (INDEPENDENT_AMBULATORY_CARE_PROVIDER_SITE_OTHER): Payer: Medicare HMO | Admitting: Family Medicine

## 2020-08-14 VITALS — BP 138/72 | HR 49 | Temp 97.8°F | Ht 66.0 in | Wt 208.6 lb

## 2020-08-14 DIAGNOSIS — Z0000 Encounter for general adult medical examination without abnormal findings: Secondary | ICD-10-CM

## 2020-08-14 DIAGNOSIS — J449 Chronic obstructive pulmonary disease, unspecified: Secondary | ICD-10-CM | POA: Diagnosis not present

## 2020-08-14 DIAGNOSIS — R3911 Hesitancy of micturition: Secondary | ICD-10-CM | POA: Diagnosis not present

## 2020-08-14 DIAGNOSIS — Z23 Encounter for immunization: Secondary | ICD-10-CM

## 2020-08-14 DIAGNOSIS — N401 Enlarged prostate with lower urinary tract symptoms: Secondary | ICD-10-CM | POA: Diagnosis not present

## 2020-08-14 DIAGNOSIS — E782 Mixed hyperlipidemia: Secondary | ICD-10-CM

## 2020-08-14 DIAGNOSIS — I2510 Atherosclerotic heart disease of native coronary artery without angina pectoris: Secondary | ICD-10-CM | POA: Diagnosis not present

## 2020-08-14 DIAGNOSIS — S51802A Unspecified open wound of left forearm, initial encounter: Secondary | ICD-10-CM

## 2020-08-14 DIAGNOSIS — I129 Hypertensive chronic kidney disease with stage 1 through stage 4 chronic kidney disease, or unspecified chronic kidney disease: Secondary | ICD-10-CM | POA: Diagnosis not present

## 2020-08-14 DIAGNOSIS — N183 Chronic kidney disease, stage 3 unspecified: Secondary | ICD-10-CM | POA: Diagnosis not present

## 2020-08-14 MED ORDER — LISINOPRIL 40 MG PO TABS: 40.0000 mg | ORAL_TABLET | Freq: Every day | ORAL | 1 refills | Status: DC

## 2020-08-14 MED ORDER — METOPROLOL TARTRATE 25 MG PO TABS: 25.0000 mg | ORAL_TABLET | Freq: Two times a day (BID) | ORAL | 1 refills | Status: DC

## 2020-08-14 MED ORDER — TAMSULOSIN HCL 0.4 MG PO CAPS: 0.4000 mg | ORAL_CAPSULE | Freq: Every day | ORAL | 1 refills | Status: DC

## 2020-08-14 MED ORDER — SIMVASTATIN 40 MG PO TABS: 40.0000 mg | ORAL_TABLET | Freq: Every day | ORAL | 1 refills | Status: DC

## 2020-08-14 MED ORDER — ALBUTEROL SULFATE HFA 108 (90 BASE) MCG/ACT IN AERS: INHALATION_SPRAY | RESPIRATORY_TRACT | 6 refills | Status: DC

## 2020-08-14 NOTE — Assessment & Plan Note (Signed)
Under good control on current regimen. Continue current regimen. Continue to monitor. Call with any concerns. Refills given. Labs drawn today.   

## 2020-08-14 NOTE — Assessment & Plan Note (Signed)
Will keep his BP and cholesterol under good control. Continue to monitor. Call with any concerns.  

## 2020-08-14 NOTE — Assessment & Plan Note (Signed)
Rechecking labs today. Await results. Treat as needed.  °

## 2020-08-14 NOTE — Progress Notes (Signed)
BP 138/72   Pulse (!) 49   Temp 97.8 F (36.6 C)   Ht 5\' 6"  (1.676 m)   Wt 208 lb 9.6 oz (94.6 kg)   SpO2 99%   BMI 33.67 kg/m    Subjective:    Patient ID: Mike Desanctis., male    DOB: 1956-04-15, 65 y.o.   MRN: [id number]  HPI: Mike Star Friscia [name] is a 65 y.o. male presenting on 08/14/2020 for comprehensive medical examination. Current medical complaints include:  HYPERTENSION / HYPERLIPIDEMIA Satisfied with current treatment? yes Duration of hypertension: chronic BP monitoring frequency: not checking BP medication side effects: no Past BP meds: metoprolol, lisinopril Duration of hyperlipidemia: chronic Cholesterol medication side effects: no Cholesterol supplements: none Past cholesterol medications: simvastatin Medication compliance: excellent compliance Aspirin: yes Recent stressors: no Recurrent headaches: no Visual changes: no Palpitations: no Dyspnea: no Chest pain: no Lower extremity edema: no Dizzy/lightheaded: no  COPD COPD status: controlled Satisfied with current treatment?: yes Oxygen use: no Dyspnea frequency: none Cough frequency:  none Rescue inhaler frequency:   none Limitation of activity: no Productive cough:  none Pneumovax: Up to Date Influenza: Up to Date  BPH BPH status: controlled Satisfied with current treatment?: yes Medication side effects: no Medication compliance: excellent compliance Duration: chronic Nocturia: 1/night Urinary frequency:no Incomplete voiding: no Urgency: no Weak urinary stream: no Straining to start stream: no Dysuria: no Onset: gradual Severity: mild  Interim Problems from his last visit: no  Depression Screen done today and results listed below:  Depression screen [facility] 2/9 08/14/2020 08/12/2019 04/10/2018 02/25/2018 04/07/2017  Decreased Interest 0 0 0 0 0  Down, Depressed, Hopeless 0 0 0 0 0  PHQ - 2 Score 0 0 0 0 0  Altered sleeping - - 1 - 1  Tired, decreased energy - - 0 - 0   Change in appetite - - 0 - 0  Feeling bad or failure about yourself  - - 0 - 0  Trouble concentrating - - 0 - 0  Moving slowly or fidgety/restless - - 0 - 0  Suicidal thoughts - - 0 - 0  PHQ-9 Score - - 1 - 1  Difficult doing work/chores - - Not difficult at all - -    Past Medical History:  Past Medical History:  Diagnosis Date  . Arthritis   . Benign enlargement of prostate   . CAD (coronary artery disease)   . Chronic kidney disease   . History of seizures as a child    Due to head injury  . Hyperlipidemia   . Hypertension   . Lumbago   . Myocardial infarction ([place])   . Urinary hesitancy     Surgical History:  Past Surgical History:  Procedure Laterality Date  . COLONOSCOPY WITH PROPOFOL N/A 10/01/2019   Procedure: COLONOSCOPY WITH PROPOFOL;  Surgeon: Lin Landsman, MD;  Location: [facility] ENDOSCOPY;  Service: Gastroenterology;  Laterality: N/A;  . coronary artery stent      Medications:  Current Outpatient Medications on File Prior to Visit  Medication Sig  . aspirin EC 81 MG tablet Take 81 mg by mouth daily.   Marland Kitchen EPINEPHrine 0.3 mg/0.3 mL IJ SOAJ injection Inject 0.3 mg into the muscle as needed for anaphylaxis.   No current facility-administered medications on file prior to visit.    Allergies:  Allergies  Allergen Reactions  . Shellfish Allergy Anaphylaxis  . Peanuts [Peanut Oil] Rash  . Geralyn Flash [Fish Allergy] Hives    Social History:  Social History   Socioeconomic History  . Marital status: Divorced    Spouse name: Not on file  . Number of children: Not on file  . Years of education: Not on file  . Highest education level: Some college, no degree  Occupational History  . Not on file  Tobacco Use  . Smoking status: Former Smoker    Packs/day: 1.00    Years: 30.00    Pack years: 30.00    Types: Cigarettes    Quit date: 04/12/2016    Years since quitting: 4.3  . Smokeless tobacco: Never Used  . Tobacco comment: since 15, max 1 ppd x10   Vaping Use  . Vaping Use: Never used  Substance and Sexual Activity  . Alcohol use: No    Alcohol/week: 0.0 standard drinks  . Drug use: No  . Sexual activity: Yes    Birth control/protection: None  Other Topics Concern  . Not on file  Social History Narrative   Goes to gym 6-7 days a week    Physiological scientist    Social Determinants of Health   Financial Resource Strain: Not on file  Food Insecurity: Not on file  Transportation Needs: Not on file  Physical Activity: Not on file  Stress: Not on file  Social Connections: Not on file  Intimate Partner Violence: Not on file   Social History   Tobacco Use  Smoking Status Former Smoker  . Packs/day: 1.00  . Years: 30.00  . Pack years: 30.00  . Types: Cigarettes  . Quit date: 04/12/2016  . Years since quitting: 4.3  Smokeless Tobacco Never Used  Tobacco Comment   since 15, max 1 ppd x10   Social History   Substance and Sexual Activity  Alcohol Use No  . Alcohol/week: 0.0 standard drinks    Family History:  Family History  Problem Relation Age of Onset  . Hypertension Mother   . Breast cancer Mother   . Hypertension Father   . Stroke Father   . Stroke Maternal Grandfather     Past medical history, surgical history, medications, allergies, family history and social history reviewed with patient today and changes made to appropriate areas of the chart.   Review of Systems  Constitutional: Negative.   HENT: Negative.   Eyes: Negative.   Respiratory: Negative.   Cardiovascular: Negative.   Gastrointestinal: Negative.   Genitourinary: Negative.   Musculoskeletal: Positive for myalgias. Negative for back pain, falls, joint pain and neck pain.  Skin: Negative.   Neurological: Negative.   Endo/Heme/Allergies: Negative.   Psychiatric/Behavioral: Negative.    All other ROS negative except what is listed above and in the HPI.      Objective:    BP 138/72   Pulse (!) 49   Temp 97.8 F (36.6 C)   Ht 5\' 6"   (1.676 m)   Wt 208 lb 9.6 oz (94.6 kg)   SpO2 99%   BMI 33.67 kg/m   Wt Readings from Last 3 Encounters:  08/14/20 208 lb 9.6 oz (94.6 kg)  02/14/20 210 lb (95.3 kg)  10/06/19 212 lb (96.2 kg)    Physical Exam Vitals and nursing note reviewed.  Constitutional:      General: He is not in acute distress.    Appearance: Normal appearance. He is obese. He is not ill-appearing, toxic-appearing or diaphoretic.  HENT:     Head: Normocephalic and atraumatic.     Right Ear: Tympanic membrane, ear canal and external ear normal. There is  no impacted cerumen.     Left Ear: Tympanic membrane, ear canal and external ear normal. There is no impacted cerumen.     Nose: Nose normal. No congestion or rhinorrhea.     Mouth/Throat:     Mouth: Mucous membranes are moist.     Pharynx: Oropharynx is clear. No oropharyngeal exudate or posterior oropharyngeal erythema.  Eyes:     General: No scleral icterus.       Right eye: No discharge.        Left eye: No discharge.     Extraocular Movements: Extraocular movements intact.     Conjunctiva/sclera: Conjunctivae normal.     Pupils: Pupils are equal, round, and reactive to light.  Neck:     Vascular: No carotid bruit.  Cardiovascular:     Rate and Rhythm: Normal rate and regular rhythm.     Pulses: Normal pulses.     Heart sounds: No murmur heard. No friction rub. No gallop.   Pulmonary:     Effort: Pulmonary effort is normal. No respiratory distress.     Breath sounds: Normal breath sounds. No stridor. No wheezing, rhonchi or rales.  Chest:     Chest wall: No tenderness.  Abdominal:     General: Abdomen is flat. Bowel sounds are normal. There is no distension.     Palpations: Abdomen is soft. There is no mass.     Tenderness: There is no abdominal tenderness. There is no right CVA tenderness, left CVA tenderness, guarding or rebound.     Hernia: No hernia is present.  Genitourinary:    Comments: Genital exam deferred with shared decision  making Musculoskeletal:        General: No swelling, tenderness, deformity or signs of injury.     Cervical back: Normal range of motion and neck supple. No rigidity. No muscular tenderness.     Right lower leg: No edema.     Left lower leg: No edema.  Lymphadenopathy:     Cervical: No cervical adenopathy.  Skin:    General: Skin is warm and dry.     Capillary Refill: Capillary refill takes less than 2 seconds.     Coloration: Skin is not jaundiced or pale.     Findings: No bruising, erythema, lesion or rash.     Comments: Small well healing wound on L forearm  Neurological:     General: No focal deficit present.     Mental Status: He is alert and oriented to person, place, and time.     Cranial Nerves: No cranial nerve deficit.     Sensory: No sensory deficit.     Motor: No weakness.     Coordination: Coordination normal.     Gait: Gait normal.     Deep Tendon Reflexes: Reflexes normal.  Psychiatric:        Mood and Affect: Mood normal.        Behavior: Behavior normal.        Thought Content: Thought content normal.        Judgment: Judgment normal.     Results for orders placed or performed in visit on 02/14/20  Comprehensive metabolic panel  Result Value Ref Range   Glucose 92 65 - 99 mg/dL   BUN 16 8 - 27 mg/dL   Creatinine, Ser 1.68 (H) 0.76 - 1.27 mg/dL   GFR calc non Af Amer 42 (L) >59 mL/min/1.73   GFR calc Af Amer 49 (L) >59 mL/min/1.73   BUN/Creatinine Ratio 10  10 - 24   Sodium 137 134 - 144 mmol/L   Potassium 4.7 3.5 - 5.2 mmol/L   Chloride 102 96 - 106 mmol/L   CO2 23 20 - 29 mmol/L   Calcium 9.6 8.6 - 10.2 mg/dL   Total Protein 6.7 6.0 - 8.5 g/dL   Albumin 4.1 3.8 - 4.8 g/dL   Globulin, Total 2.6 1.5 - 4.5 g/dL   Albumin/Globulin Ratio 1.6 1.2 - 2.2   Bilirubin Total 0.3 0.0 - 1.2 mg/dL   Alkaline Phosphatase 130 (H) 44 - 121 IU/L   AST 31 0 - 40 IU/L   ALT 18 0 - 44 IU/L  Lipid Panel w/o Chol/HDL Ratio  Result Value Ref Range   Cholesterol, Total  154 100 - 199 mg/dL   Triglycerides 102 0 - 149 mg/dL   HDL 55 >39 mg/dL   VLDL Cholesterol Cal 19 5 - 40 mg/dL   LDL Chol Calc (NIH) 80 0 - 99 mg/dL  PSA  Result Value Ref Range   Prostate Specific Ag, Serum 0.5 0.0 - 4.0 ng/mL      Assessment & Plan:   Problem List Items Addressed This Visit      Cardiovascular and Mediastinum   CAD (coronary artery disease)    Will keep his BP and cholesterol under good control. Continue to monitor. Call with any concerns.       Relevant Medications   simvastatin (ZOCOR) 40 MG tablet   metoprolol tartrate (LOPRESSOR) 25 MG tablet   lisinopril (ZESTRIL) 40 MG tablet   Other Relevant Orders   Comprehensive metabolic panel   CBC with Differential/Platelet     Respiratory   COPD (chronic obstructive pulmonary disease) (HCC)    Under good control on current regimen. Continue current regimen. Continue to monitor. Call with any concerns. Refills given. Labs drawn today.        Relevant Medications   albuterol (VENTOLIN HFA) 108 (90 Base) MCG/ACT inhaler   Other Relevant Orders   Comprehensive metabolic panel   CBC with Differential/Platelet     Genitourinary   Benign hypertensive renal disease    Under good control on current regimen. Continue current regimen. Continue to monitor. Call with any concerns. Refills given. Labs drawn today.        Relevant Orders   Comprehensive metabolic panel   CBC with Differential/Platelet   TSH   Microalbumin, Urine Waived   CKD (chronic kidney disease), stage III ([place])    Rechecking labs today. Await results. Treat as needed.       Relevant Orders   Comprehensive metabolic panel   CBC with Differential/Platelet   Microalbumin, Urine Waived     Other   HLD (hyperlipidemia)    Under good control on current regimen. Continue current regimen. Continue to monitor. Call with any concerns. Refills given. Labs drawn today.        Relevant Medications   simvastatin (ZOCOR) 40 MG tablet    metoprolol tartrate (LOPRESSOR) 25 MG tablet   lisinopril (ZESTRIL) 40 MG tablet   Other Relevant Orders   Comprehensive metabolic panel   CBC with Differential/Platelet   Lipid Panel w/o Chol/HDL Ratio   Urinary hesitancy due to benign prostatic hyperplasia    Under good control on current regimen. Continue current regimen. Continue to monitor. Call with any concerns. Refills given. Labs drawn today.        Relevant Orders   Comprehensive metabolic panel   CBC with Differential/Platelet   PSA  Urinalysis, Routine w reflex microscopic    Other Visit Diagnoses    Routine general medical examination at a health care facility    -  Primary   Vaccines updated. Screening labs checked today. Colonoscopy up to date. Continue diet and exercise. Call with any concerns.    Open wound of left forearm, initial encounter       Due for Td- given today.   Relevant Orders   Td : Tetanus/diphtheria >7yo Preservative  free (Completed)       Discussed aspirin prophylaxis for myocardial infarction prevention and decision was made to continue ASA  LABORATORY TESTING:  Health maintenance labs ordered today as discussed above.   The natural history of prostate cancer and ongoing controversy regarding screening and potential treatment outcomes of prostate cancer has been discussed with the patient. The meaning of a false positive PSA and a false negative PSA has been discussed. He indicates understanding of the limitations of this screening test and wishes to proceed with screening PSA testing.   IMMUNIZATIONS:   - Tdap: Tetanus vaccination status reviewed: Td vaccination indicated and given today. - Influenza: Up to date - Pneumovax: Up to date - Prevnar: Up to date  SCREENING: - Colonoscopy: Up to date  Discussed with patient purpose of the colonoscopy is to detect colon cancer at curable precancerous or early stages    PATIENT COUNSELING:    Sexuality: Discussed sexually transmitted  diseases, partner selection, use of condoms, avoidance of unintended pregnancy  and contraceptive alternatives.   Advised to avoid cigarette smoking.  I discussed with the patient that most people either abstain from alcohol or drink within safe limits (<=14/week and <=4 drinks/occasion for males, <=7/weeks and <= 3 drinks/occasion for females) and that the risk for alcohol disorders and other health effects rises proportionally with the number of drinks per week and how often a drinker exceeds daily limits.  Discussed cessation/primary prevention of drug use and availability of treatment for abuse.   Diet: Encouraged to adjust caloric intake to maintain  or achieve ideal body weight, to reduce intake of dietary saturated fat and total fat, to limit sodium intake by avoiding high sodium foods and not adding table salt, and to maintain adequate dietary potassium and calcium preferably from fresh fruits, vegetables, and low-fat dairy products.    stressed the importance of regular exercise  Injury prevention: Discussed safety belts, safety helmets, smoke detector, smoking near bedding or upholstery.   Dental health: Discussed importance of regular tooth brushing, flossing, and dental visits.   Follow up plan: NEXT PREVENTATIVE PHYSICAL DUE IN 1 YEAR. Return in about 6 months (around 02/14/2021).

## 2020-08-14 NOTE — Patient Instructions (Signed)
Health Maintenance After Age 66 After age 64, you are at a higher risk for certain long-term diseases and infections as well as injuries from falls. Falls are a major cause of broken bones and head injuries in people who are older than age 41. Getting regular preventive care can help to keep you healthy and well. Preventive care includes getting regular testing and making lifestyle changes as recommended by your health care provider. Talk with your health care provider about:  Which screenings and tests you should have. A screening is a test that checks for a disease when you have no symptoms.  A diet and exercise plan that is right for you. What should I know about screenings and tests to prevent falls? Screening and testing are the best ways to find a health problem early. Early diagnosis and treatment give you the best chance of managing medical conditions that are common after age 19. Certain conditions and lifestyle choices may make you more likely to have a fall. Your health care provider may recommend:  Regular vision checks. Poor vision and conditions such as cataracts can make you more likely to have a fall. If you wear glasses, make sure to get your prescription updated if your vision changes.  Medicine review. Work with your health care provider to regularly review all of the medicines you are taking, including over-the-counter medicines. Ask your health care provider about any side effects that may make you more likely to have a fall. Tell your health care provider if any medicines that you take make you feel dizzy or sleepy.  Osteoporosis screening. Osteoporosis is a condition that causes the bones to get weaker. This can make the bones weak and cause them to break more easily.  Blood pressure screening. Blood pressure changes and medicines to control blood pressure can make you feel dizzy.  Strength and balance checks. Your health care provider may recommend certain tests to check your  strength and balance while standing, walking, or changing positions.  Foot health exam. Foot pain and numbness, as well as not wearing proper footwear, can make you more likely to have a fall.  Depression screening. You may be more likely to have a fall if you have a fear of falling, feel emotionally low, or feel unable to do activities that you used to do.  Alcohol use screening. Using too much alcohol can affect your balance and may make you more likely to have a fall. What actions can I take to lower my risk of falls? General instructions  Talk with your health care provider about your risks for falling. Tell your health care provider if: ? You fall. Be sure to tell your health care provider about all falls, even ones that seem minor. ? You feel dizzy, sleepy, or off-balance.  Take over-the-counter and prescription medicines only as told by your health care provider. These include any supplements.  Eat a healthy diet and maintain a healthy weight. A healthy diet includes low-fat dairy products, low-fat (lean) meats, and fiber from whole grains, beans, and lots of fruits and vegetables. Home safety  Remove any tripping hazards, such as rugs, cords, and clutter.  Install safety equipment such as grab bars in bathrooms and safety rails on stairs.  Keep rooms and walkways well-lit. Activity  Follow a regular exercise program to stay fit. This will help you maintain your balance. Ask your health care provider what types of exercise are appropriate for you.  If you need a cane or walker,  use it as recommended by your health care provider.  Wear supportive shoes that have nonskid soles.   Lifestyle  Do not drink alcohol if your health care provider tells you not to drink.  If you drink alcohol, limit how much you have: ? 0-1 drink a day for women. ? 0-2 drinks a day for men.  Be aware of how much alcohol is in your drink. In the [place] one drink equals one typical bottle of beer (12  oz), one-half glass of wine (5 oz), or one shot of hard liquor (1 oz).  Do not use any products that contain nicotine or tobacco, such as cigarettes and e-cigarettes. If you need help quitting, ask your health care provider. Summary  Having a healthy lifestyle and getting preventive care can help to protect your health and wellness after age 40.  Screening and testing are the best way to find a health problem early and help you avoid having a fall. Early diagnosis and treatment give you the best chance for managing medical conditions that are more common for people who are older than age 61.  Falls are a major cause of broken bones and head injuries in people who are older than age 23. Take precautions to prevent a fall at home.  Work with your health care provider to learn what changes you can make to improve your health and wellness and to prevent falls. This information is not intended to replace advice given to you by your health care provider. Make sure you discuss any questions you have with your health care provider. Document Revised: 08/27/2018 Document Reviewed: 03/19/2017 Elsevier Patient Education  2021 [employer]. Td (Tetanus, Diphtheria) Vaccine: What You Need to Know 1. Why get vaccinated? Td vaccine can prevent tetanus and diphtheria. Tetanus enters the body through cuts or wounds. Diphtheria spreads from person to person.  TETANUS (T) causes painful stiffening of the muscles. Tetanus can lead to serious health problems, including being unable to open the mouth, having trouble swallowing and breathing, or death.  DIPHTHERIA (D) can lead to difficulty breathing, heart failure, paralysis, or death. 2. Td vaccine Td is only for children 7 years and older, adolescents, and adults.  Td is usually given as a booster dose every 10 years, or after 5 years in the case of a severe or dirty wound or burn. Another vaccine, called "Tdap," may be used instead of Td. Tdap protects  against pertussis, also known as "whooping cough," in addition to tetanus and diphtheria. Td may be given at the same time as other vaccines. 3. Talk with your health care provider Tell your vaccination provider if the person getting the vaccine:  Has had an allergic reaction after a previous dose of any vaccine that protects against tetanus or diphtheria, or has any severe, life-threatening allergies  Has ever had Guillain-Barr Syndrome (also called "GBS")  Has had severe pain or swelling after a previous dose of any vaccine that protects against tetanus or diphtheria In some cases, your health care provider may decide to postpone Td vaccination until a future visit. People with minor illnesses, such as a cold, may be vaccinated. People who are moderately or severely ill should usually wait until they recover before getting Td vaccine.  Your health care provider can give you more information. 4. Risks of a vaccine reaction  Pain, redness, or swelling where the shot was given, mild fever, headache, feeling tired, and nausea, vomiting, diarrhea, or stomachache sometimes happen after Td vaccination. People  sometimes faint after medical procedures, including vaccination. Tell your provider if you feel dizzy or have vision changes or ringing in the ears.  As with any medicine, there is a very remote chance of a vaccine causing a severe allergic reaction, other serious injury, or death. 5. What if there is a serious problem? An allergic reaction could occur after the vaccinated person leaves the clinic. If you see signs of a severe allergic reaction (hives, swelling of the face and throat, difficulty breathing, a fast heartbeat, dizziness, or weakness), call 9-1-1 and get the person to the nearest hospital.  For other signs that concern you, call your health care provider.  Adverse reactions should be reported to the Vaccine Adverse Event Reporting System (VAERS). Your health care provider will  usually file this report, or you can do it yourself. Visit the VAERS website at www.vaers.SamedayNews.es or call 8637071754. VAERS is only for reporting reactions, and VAERS staff members do not give medical advice. 6. The National Vaccine Injury Compensation Program The [employer] Vaccine Injury Compensation Program (VICP) is a federal program that was created to compensate people who may have been injured by certain vaccines. Claims regarding alleged injury or death due to vaccination have a time limit for filing, which may be as short as two years. Visit the VICP website at GoldCloset.com.ee or call 435-778-2238 to learn about the program and about filing a claim. 7. How can I learn more?  Ask your health care provider.  Call your local or state health department.  Visit the website of the Food and Drug Administration (FDA) for vaccine package inserts and additional information at TraderRating.uy.  Contact the Centers for Disease Control and Prevention (CDC): ? Call 2180413712 (1-800-CDC-INFO) or ? Visit CDC's website at http://hunter.com/. Vaccine Information Statement Td (Tetanus, Diphtheria) Vaccine (12/24/2019) This information is not intended to replace advice given to you by your health care provider. Make sure you discuss any questions you have with your health care provider. Document Revised: 02/10/2020 Document Reviewed: 02/10/2020 Elsevier Patient Education  2021 [employer].

## 2020-08-15 LAB — COMPREHENSIVE METABOLIC PANEL
ALT: 20 IU/L (ref 0–44)
AST: 36 IU/L (ref 0–40)
Albumin/Globulin Ratio: 1.5 (ref 1.2–2.2)
Albumin: 4.2 g/dL (ref 3.8–4.8)
Alkaline Phosphatase: 130 IU/L — ABNORMAL HIGH (ref 44–121)
BUN/Creatinine Ratio: 13 (ref 10–24)
BUN: 21 mg/dL (ref 8–27)
Bilirubin Total: 0.3 mg/dL (ref 0.0–1.2)
CO2: 21 mmol/L (ref 20–29)
Calcium: 9.4 mg/dL (ref 8.6–10.2)
Chloride: 102 mmol/L (ref 96–106)
Creatinine, Ser: 1.66 mg/dL — ABNORMAL HIGH (ref 0.76–1.27)
Globulin, Total: 2.8 g/dL (ref 1.5–4.5)
Glucose: 111 mg/dL — ABNORMAL HIGH (ref 65–99)
Potassium: 4.6 mmol/L (ref 3.5–5.2)
Sodium: 138 mmol/L (ref 134–144)
Total Protein: 7 g/dL (ref 6.0–8.5)
eGFR: 45 mL/min/{1.73_m2} — ABNORMAL LOW (ref >59)

## 2020-08-15 LAB — CBC WITH DIFFERENTIAL/PLATELET
Basophils Absolute: 0.1 10*3/uL (ref 0.0–0.2)
Basos: 1 %
EOS (ABSOLUTE): 0.4 10*3/uL (ref 0.0–0.4)
Eos: 8 %
Hematocrit: 43.7 % (ref 37.5–51.0)
Hemoglobin: 14.2 g/dL (ref 13.0–17.7)
Immature Grans (Abs): 0 10*3/uL (ref 0.0–0.1)
Immature Granulocytes: 1 %
Lymphocytes Absolute: 2.1 10*3/uL (ref 0.7–3.1)
Lymphs: 37 %
MCH: 25.6 pg — ABNORMAL LOW (ref 26.6–33.0)
MCHC: 32.5 g/dL (ref 31.5–35.7)
MCV: 79 fL (ref 79–97)
Monocytes Absolute: 0.5 10*3/uL (ref 0.1–0.9)
Monocytes: 10 %
Neutrophils Absolute: 2.5 10*3/uL (ref 1.4–7.0)
Neutrophils: 43 %
Platelets: 192 10*3/uL (ref 150–450)
RBC: 5.54 x10E6/uL (ref 4.14–5.80)
RDW: 13 % (ref 11.6–15.4)
WBC: 5.6 10*3/uL (ref 3.4–10.8)

## 2020-08-15 LAB — PSA: Prostate Specific Ag, Serum: 0.4 ng/mL (ref 0.0–4.0)

## 2020-08-15 LAB — URINALYSIS, ROUTINE W REFLEX MICROSCOPIC
Bilirubin, UA: NEGATIVE
Glucose, UA: NEGATIVE
Ketones, UA: NEGATIVE
Leukocytes,UA: NEGATIVE
Nitrite, UA: NEGATIVE
Protein,UA: NEGATIVE
RBC, UA: NEGATIVE
Specific Gravity, UA: >1.03 — ABNORMAL HIGH (ref 1.005–1.030)
Urobilinogen, Ur: 0.2 mg/dL (ref 0.2–1.0)
pH, UA: 5.5 (ref 5.0–7.5)

## 2020-08-15 LAB — MICROALBUMIN, URINE WAIVED
Creatinine, Urine Waived: 300 mg/dL (ref 10–300)
Microalb, Ur Waived: 10 mg/L (ref 0–19)
Microalb/Creat Ratio: <30 mg/g (ref <30)

## 2020-08-15 LAB — LIPID PANEL W/O CHOL/HDL RATIO
Cholesterol, Total: 161 mg/dL (ref 100–199)
HDL: 55 mg/dL (ref >39)
LDL Chol Calc (NIH): 89 mg/dL (ref 0–99)
Triglycerides: 94 mg/dL (ref 0–149)
VLDL Cholesterol Cal: 17 mg/dL (ref 5–40)

## 2020-08-15 LAB — TSH: TSH: 1.49 u[IU]/mL (ref 0.450–4.500)

## 2020-08-17 ENCOUNTER — Ambulatory Visit: Payer: Medicare HMO

## 2020-08-25 ENCOUNTER — Ambulatory Visit (INDEPENDENT_AMBULATORY_CARE_PROVIDER_SITE_OTHER): Payer: Medicare HMO

## 2020-08-25 VITALS — Ht 65.0 in | Wt 206.0 lb

## 2020-08-25 DIAGNOSIS — Z0000 Encounter for general adult medical examination without abnormal findings: Secondary | ICD-10-CM | POA: Diagnosis not present

## 2020-08-25 NOTE — Progress Notes (Signed)
I connected with Jens Som. today by telephone and verified that I am speaking with the correct person using two identifiers. Location patient: home Location provider: work Persons participating in the virtual visit: Jens Som., Glenna Durand LPN.   I discussed the limitations, risks, security and privacy concerns of performing an evaluation and management service by telephone and the availability of in person appointments. I also discussed with the patient that there may be a patient responsible charge related to this service. The patient expressed understanding and verbally consented to this telephonic visit.    Interactive audio and video telecommunications were attempted between this provider and patient, however failed, due to patient having technical difficulties OR patient did not have access to video capability.  We continued and completed visit with audio only.     Vital signs may be patient reported or missing.  Subjective:   Lovett Coffin Cuadrado [name] is a 65 y.o. male who presents for Medicare Annual/Subsequent preventive examination.  Review of Systems     Cardiac Risk Factors include: advanced age (>72men, >8 women)     Objective:    Today's Vitals   08/25/20 0856  Weight: 206 lb (93.4 kg)  Height: 5\' 5"  (1.651 m)   Body mass index is 34.28 kg/m.  Advanced Directives 08/25/2020 10/01/2019 02/25/2018 02/21/2017 04/02/2016  Does Patient Have a Medical Advance Directive? No No No No No  Would patient like information on creating a medical advance directive? - - Yes (MAU/Ambulatory/Procedural Areas - Information given) No - Patient declined Yes - Educational materials given    Current Medications (verified) Outpatient Encounter Medications as of 08/25/2020  Medication Sig  . albuterol (VENTOLIN HFA) 108 (90 Base) MCG/ACT inhaler INHALE 2 PUFFS INTO THE LUNGS EVERY 6  HOURS AS NEEDED FOR WHEEZING OR SHORTNESS OF BREATH.  Marland Kitchen aspirin EC 81 MG tablet Take 81 mg  by mouth daily.   Marland Kitchen EPINEPHrine 0.3 mg/0.3 mL IJ SOAJ injection Inject 0.3 mg into the muscle as needed for anaphylaxis.  Marland Kitchen lisinopril (ZESTRIL) 40 MG tablet Take 1 tablet (40 mg total) by mouth daily.  . metoprolol tartrate (LOPRESSOR) 25 MG tablet Take 1 tablet (25 mg total) by mouth 2 (two) times daily.  . simvastatin (ZOCOR) 40 MG tablet Take 1 tablet (40 mg total) by mouth daily.  . tamsulosin (FLOMAX) 0.4 MG CAPS capsule Take 1 capsule (0.4 mg total) by mouth daily.   No facility-administered encounter medications on file as of 08/25/2020.    Allergies (verified) Shellfish allergy, Peanuts [peanut oil], and Blain Pais allergy]   History: Past Medical History:  Diagnosis Date  . Arthritis   . Benign enlargement of prostate   . CAD (coronary artery disease)   . Chronic kidney disease   . History of seizures as a child    Due to head injury  . Hyperlipidemia   . Hypertension   . Lumbago   . Myocardial infarction ([place])   . Urinary hesitancy    Past Surgical History:  Procedure Laterality Date  . COLONOSCOPY WITH PROPOFOL N/A 10/01/2019   Procedure: COLONOSCOPY WITH PROPOFOL;  Surgeon: Lin Landsman, MD;  Location: [facility];  Service: Gastroenterology;  Laterality: N/A;  . coronary artery stent     Family History  Problem Relation Age of Onset  . Hypertension Mother   . Breast cancer Mother   . Hypertension Father   . Stroke Father   . Stroke Maternal Grandfather    Social History   Socioeconomic  History  . Marital status: Divorced    Spouse name: Not on file  . Number of children: Not on file  . Years of education: Not on file  . Highest education level: Some college, no degree  Occupational History  . Occupation: retired  Tobacco Use  . Smoking status: Former Smoker    Packs/day: 1.00    Years: 30.00    Pack years: 30.00    Types: Cigarettes    Quit date: 04/12/2016    Years since quitting: 4.3  . Smokeless tobacco: Never Used  . Tobacco  comment: since 15, max 1 ppd x10  Vaping Use  . Vaping Use: Never used  Substance and Sexual Activity  . Alcohol use: No    Alcohol/week: 0.0 standard drinks  . Drug use: No  . Sexual activity: Yes    Birth control/protection: None  Other Topics Concern  . Not on file  Social History Narrative   Goes to gym 6-7 days a week    Personal trainer    Social Determinants of Health   Financial Resource Strain: Low Risk   . Difficulty of Paying Living Expenses: Not hard at all  Food Insecurity: No Food Insecurity  . Worried About Charity fundraiser in the Last Year: Never true  . Ran Out of Food in the Last Year: Never true  Transportation Needs: No Transportation Needs  . Lack of Transportation (Medical): No  . Lack of Transportation (Non-Medical): No  Physical Activity: Sufficiently Active  . Days of Exercise per Week: 6 days  . Minutes of Exercise per Session: 90 min  Stress: No Stress Concern Present  . Feeling of Stress : Not at all  Social Connections: Not on file    Tobacco Counseling Counseling given: Not Answered Comment: since 15, max 1 ppd x10   Clinical Intake:  Pre-visit preparation completed: Yes  Pain : No/denies pain     Nutritional Status: BMI > 30  Obese Nutritional Risks: None Diabetes: No  How often do you need to have someone help you when you read instructions, pamphlets, or other written materials from your doctor or pharmacy?: 1 - Never What is the last grade level you completed in school?: 11th grade GED  Diabetic? no  Interpreter Needed?: No  Information entered by :: NAllen LPN   Activities of Daily Living In your present state of health, do you have any difficulty performing the following activities: 08/25/2020 08/14/2020  Hearing? N N  Vision? N N  Difficulty concentrating or making decisions? N N  Walking or climbing stairs? N N  Dressing or bathing? N N  Doing errands, shopping? N N  Preparing Food and eating ? N -  Using the  Toilet? N -  In the past six months, have you accidently leaked urine? N -  Do you have problems with loss of bowel control? N -  Managing your Medications? N -  Managing your Finances? N -  Housekeeping or managing your Housekeeping? N -  Some recent data might be hidden    Patient Care Team: Valerie Roys, DO as PCP - General (Family Medicine)  Indicate any recent Medical Services you may have received from other than [facility] providers in the past year (date may be approximate).     Assessment:   This is a routine wellness examination for [place].  Hearing/Vision screen  Hearing Screening   125Hz  250Hz  500Hz  1000Hz  2000Hz  3000Hz  4000Hz  6000Hz  8000Hz   Right ear:  Left ear:           Vision Screening Comments: No regular eye exams,  Dietary issues and exercise activities discussed: Current Exercise Habits: Home exercise routine, Type of exercise: strength training/weights;stretching;treadmill (stationary bike), Time (Minutes): > 60, Frequency (Times/Week): 6, Weekly Exercise (Minutes/Week): 0  Goals    . DIET - INCREASE WATER INTAKE     Recommend continue drinking at least 6-8 glasses of water a day     . Patient Stated     08/25/2020, stay healthy      Depression Screen PHQ 2/9 Scores 08/25/2020 08/14/2020 08/12/2019 04/10/2018 02/25/2018 04/07/2017 02/21/2017  PHQ - 2 Score 0 0 0 0 0 0 0  PHQ- 9 Score - - - 1 - 1 -    Fall Risk Fall Risk  08/25/2020 08/14/2020 08/12/2019 02/25/2018 04/07/2017  Falls in the past year? 0 0 0 No No  Number falls in past yr: - 0 0 - -  Injury with Fall? - 0 0 - -  Risk for fall due to : Medication side effect No Fall Risks - - -  Follow up Falls evaluation completed;Education provided;Falls prevention discussed Falls evaluation completed - - -    FALL RISK PREVENTION PERTAINING TO THE HOME:  Any stairs in or around the home? No  If so, are there any without handrails? n/a Home free of loose throw rugs in walkways, pet beds, electrical  cords, etc? Yes  Adequate lighting in your home to reduce risk of falls? Yes   ASSISTIVE DEVICES UTILIZED TO PREVENT FALLS:  Life alert? No  Use of a cane, walker or w/c? No  Grab bars in the bathroom? Yes  Shower chair or bench in shower? No  Elevated toilet seat or a handicapped toilet? No   TIMED UP AND GO:  Was the test performed? No .   Cognitive Function:     6CIT Screen 08/25/2020 02/25/2018 02/21/2017 04/02/2016  What Year? 0 points 0 points 0 points 0 points  What month? 0 points 0 points 0 points 0 points  What time? 0 points 0 points 0 points 0 points  Count back from 20 0 points 0 points 0 points 0 points  Months in reverse 0 points 0 points 0 points 0 points  Repeat phrase 2 points 2 points 2 points 2 points  Total Score 2 2 2 2     Immunizations Immunization History  Administered Date(s) Administered  . Fluad Quad(high Dose 65+) 02/14/2020  . Influenza,inj,Quad PF,6+ Mos 03/20/2015, 04/02/2016, 02/21/2017, 02/25/2018, 02/02/2019  . Moderna Sars-Covid-2 Vaccination 09/11/2019, 10/11/2019  . Pneumococcal Conjugate-13 02/16/2014  . Pneumococcal Polysaccharide-23 02/14/2020  . Td 08/14/2020  . Tdap 05/20/2010  . Zoster 07/19/2014    TDAP status: Up to date  Flu Vaccine status: Up to date  Pneumococcal vaccine status: Up to date  Covid-19 vaccine status: Completed vaccines  Qualifies for Shingles Vaccine? Yes   Zostavax completed Yes   Shingrix Completed?: No.    Education has been provided regarding the importance of this vaccine. Patient has been advised to call insurance company to determine out of pocket expense if they have not yet received this vaccine. Advised may also receive vaccine at local pharmacy or Health Dept. Verbalized acceptance and understanding.  Screening Tests Health Maintenance  Topic Date Due  . COVID-19 Vaccine (3 - Booster for Moderna series) 08/30/2020 (Originally 04/12/2020)  . INFLUENZA VACCINE  12/18/2020  . COLONOSCOPY (Pts  45-21yrs Insurance coverage will need to be confirmed)  09/30/2024  . TETANUS/TDAP  08/15/2030  . Hepatitis C Screening  Completed  . PNA vac Low Risk Adult  Completed  . HPV VACCINES  Aged Out    Health Maintenance  There are no preventive care reminders to display for this patient.  Colorectal cancer screening: Type of screening: Colonoscopy. Completed 10/01/2019. Repeat every 10 years  Lung Cancer Screening: (Low Dose CT Chest recommended if Age 6-80 years, 30 pack-year currently smoking OR have quit w/in 15years.) does qualify.   Lung Cancer Screening Referral: CT scan 10/08/2019  Additional Screening:  Hepatitis C Screening: does qualify; Completed 03/20/2015  Vision Screening: Recommended annual ophthalmology exams for early detection of glaucoma and other disorders of the eye. Is the patient up to date with their annual eye exam?  No  Who is the provider or what is the name of the office in which the patient attends annual eye exams? none If pt is not established with a provider, would they like to be referred to a provider to establish care? No .   Dental Screening: Recommended annual dental exams for proper oral hygiene  Community Resource Referral / Chronic Care Management: CRR required this visit?  No   CCM required this visit?  No      Plan:     I have personally reviewed and noted the following in the patient's chart:   . Medical and social history . Use of alcohol, tobacco or illicit drugs  . Current medications and supplements . Functional ability and status . Nutritional status . Physical activity . Advanced directives . List of other physicians . Hospitalizations, surgeries, and ER visits in previous 12 months . Vitals . Screenings to include cognitive, depression, and falls . Referrals and appointments  In addition, I have reviewed and discussed with patient certain preventive protocols, quality metrics, and best practice recommendations. A  written personalized care plan for preventive services as well as general preventive health recommendations were provided to patient.     Kellie Simmering, LPN   12/19/4479   Nurse Notes:

## 2020-08-25 NOTE — Patient Instructions (Signed)
Mike [name] , Thank you for taking time to come for your Medicare Wellness Visit. I appreciate your ongoing commitment to your health goals. Please review the following plan we discussed and let me know if I can assist you in the future.   Screening recommendations/referrals: Colonoscopy: completed 10/01/2019 Recommended yearly ophthalmology/optometry visit for glaucoma screening and checkup Recommended yearly dental visit for hygiene and checkup  Vaccinations: Influenza vaccine: completed 02/14/2020, due 12/18/2020 Pneumococcal vaccine: completed 02/14/2020 Tdap vaccine: completed 08/14/2020, due 08/15/2030 Shingles vaccine: discussed   Covid-19:  10/11/2019, 09/11/2019  Advanced directives: Advance directive discussed with you today.   Conditions/risks identified: none  Next appointment: Follow up in one year for your annual wellness visit.   Preventive Care 66 Years and Older, Male Preventive care refers to lifestyle choices and visits with your health care provider that can promote health and wellness. What does preventive care include?  A yearly physical exam. This is also called an annual well check.  Dental exams once or twice a year.  Routine eye exams. Ask your health care provider how often you should have your eyes checked.  Personal lifestyle choices, including:  Daily care of your teeth and gums.  Regular physical activity.  Eating a healthy diet.  Avoiding tobacco and drug use.  Limiting alcohol use.  Practicing safe sex.  Taking low doses of aspirin every day.  Taking vitamin and mineral supplements as recommended by your health care provider. What happens during an annual well check? The services and screenings done by your health care provider during your annual well check will depend on your age, overall health, lifestyle risk factors, and family history of disease. Counseling  Your health care provider may ask you questions about your:  Alcohol  use.  Tobacco use.  Drug use.  Emotional well-being.  Home and relationship well-being.  Sexual activity.  Eating habits.  History of falls.  Memory and ability to understand (cognition).  Work and work Statistician. Screening  You may have the following tests or measurements:  Height, weight, and BMI.  Blood pressure.  Lipid and cholesterol levels. These may be checked every 5 years, or more frequently if you are over 66 years old.  Skin check.  Lung cancer screening. You may have this screening every year starting at age 66 if you have a 30-pack-year history of smoking and currently smoke or have quit within the past 15 years.  Fecal occult blood test (FOBT) of the stool. You may have this test every year starting at age 66.  Flexible sigmoidoscopy or colonoscopy. You may have a sigmoidoscopy every 5 years or a colonoscopy every 10 years starting at age 66.  Prostate cancer screening. Recommendations will vary depending on your family history and other risks.  Hepatitis C blood test.  Hepatitis B blood test.  Sexually transmitted disease (STD) testing.  Diabetes screening. This is done by checking your blood sugar (glucose) after you have not eaten for a while (fasting). You may have this done every 1-3 years.  Abdominal aortic aneurysm (AAA) screening. You may need this if you are a current or former smoker.  Osteoporosis. You may be screened starting at age 66 if you are at high risk. Talk with your health care provider about your test results, treatment options, and if necessary, the need for more tests. Vaccines  Your health care provider may recommend certain vaccines, such as:  Influenza vaccine. This is recommended every year.  Tetanus, diphtheria, and acellular pertussis (Tdap, Td)  vaccine. You may need a Td booster every 10 years.  Zoster vaccine. You may need this after age 66.  Pneumococcal 13-valent conjugate (PCV13) vaccine. One dose is  recommended after age 66.  Pneumococcal polysaccharide (PPSV23) vaccine. One dose is recommended after age 66. Talk to your health care provider about which screenings and vaccines you need and how often you need them. This information is not intended to replace advice given to you by your health care provider. Make sure you discuss any questions you have with your health care provider. Document Released: 06/02/2015 Document Revised: 01/24/2016 Document Reviewed: 03/07/2015 Elsevier Interactive Patient Education  2017 [employer] Prevention in the Home Falls can cause injuries. They can happen to people of all ages. There are many things you can do to make your home safe and to help prevent falls. What can I do on the outside of my home?  Regularly fix the edges of walkways and driveways and fix any cracks.  Remove anything that might make you trip as you walk through a door, such as a raised step or threshold.  Trim any bushes or trees on the path to your home.  Use bright outdoor lighting.  Clear any walking paths of anything that might make someone trip, such as rocks or tools.  Regularly check to see if handrails are loose or broken. Make sure that both sides of any steps have handrails.  Any raised decks and porches should have guardrails on the edges.  Have any leaves, snow, or ice cleared regularly.  Use sand or salt on walking paths during winter.  Clean up any spills in your garage right away. This includes oil or grease spills. What can I do in the bathroom?  Use night lights.  Install grab bars by the toilet and in the tub and shower. Do not use towel bars as grab bars.  Use non-skid mats or decals in the tub or shower.  If you need to sit down in the shower, use a plastic, non-slip stool.  Keep the floor dry. Clean up any water that spills on the floor as soon as it happens.  Remove soap buildup in the tub or shower regularly.  Attach bath mats  securely with double-sided non-slip rug tape.  Do not have throw rugs and other things on the floor that can make you trip. What can I do in the bedroom?  Use night lights.  Make sure that you have a light by your bed that is easy to reach.  Do not use any sheets or blankets that are too big for your bed. They should not hang down onto the floor.  Have a firm chair that has side arms. You can use this for support while you get dressed.  Do not have throw rugs and other things on the floor that can make you trip. What can I do in the kitchen?  Clean up any spills right away.  Avoid walking on wet floors.  Keep items that you use a lot in easy-to-reach places.  If you need to reach something above you, use a strong step stool that has a grab bar.  Keep electrical cords out of the way.  Do not use floor polish or wax that makes floors slippery. If you must use wax, use non-skid floor wax.  Do not have throw rugs and other things on the floor that can make you trip. What can I do with my stairs?  Do not leave  any items on the stairs.  Make sure that there are handrails on both sides of the stairs and use them. Fix handrails that are broken or loose. Make sure that handrails are as long as the stairways.  Check any carpeting to make sure that it is firmly attached to the stairs. Fix any carpet that is loose or worn.  Avoid having throw rugs at the top or bottom of the stairs. If you do have throw rugs, attach them to the floor with carpet tape.  Make sure that you have a light switch at the top of the stairs and the bottom of the stairs. If you do not have them, ask someone to add them for you. What else can I do to help prevent falls?  Wear shoes that:  Do not have high heels.  Have rubber bottoms.  Are comfortable and fit you well.  Are closed at the toe. Do not wear sandals.  If you use a stepladder:  Make sure that it is fully opened. Do not climb a closed  stepladder.  Make sure that both sides of the stepladder are locked into place.  Ask someone to hold it for you, if possible.  Clearly mark and make sure that you can see:  Any grab bars or handrails.  First and last steps.  Where the edge of each step is.  Use tools that help you move around (mobility aids) if they are needed. These include:  Canes.  Walkers.  Scooters.  Crutches.  Turn on the lights when you go into a dark area. Replace any light bulbs as soon as they burn out.  Set up your furniture so you have a clear path. Avoid moving your furniture around.  If any of your floors are uneven, fix them.  If there are any pets around you, be aware of where they are.  Review your medicines with your doctor. Some medicines can make you feel dizzy. This can increase your chance of falling. Ask your doctor what other things that you can do to help prevent falls. This information is not intended to replace advice given to you by your health care provider. Make sure you discuss any questions you have with your health care provider. Document Released: 03/02/2009 Document Revised: 10/12/2015 Document Reviewed: 06/10/2014 Elsevier Interactive Patient Education  2017 [employer].

## 2020-10-04 ENCOUNTER — Telehealth: Payer: Self-pay

## 2020-10-04 NOTE — Telephone Encounter (Signed)
Patient is scheduled for annual lung cancer screening CT scan on Tuesday may 24 at 9:30. He quit smoking 5 years ago and insurance is still [employer].

## 2020-10-06 ENCOUNTER — Other Ambulatory Visit: Payer: Self-pay | Admitting: *Deleted

## 2020-10-06 DIAGNOSIS — Z87891 Personal history of nicotine dependence: Secondary | ICD-10-CM

## 2020-10-06 DIAGNOSIS — Z122 Encounter for screening for malignant neoplasm of respiratory organs: Secondary | ICD-10-CM

## 2020-10-06 NOTE — Progress Notes (Signed)
Contacted and scheduled for annual lung screening scan. Patient is a former smoker, quit 2017, 30 pack year history.  

## 2020-10-10 ENCOUNTER — Ambulatory Visit
Admission: RE | Admit: 2020-10-10 | Discharge: 2020-10-10 | Disposition: A | Discharge status: Home / Self Care | Payer: Medicare HMO | Source: Ambulatory Visit | Attending: Nurse Practitioner | Admitting: Nurse Practitioner

## 2020-10-10 ENCOUNTER — Other Ambulatory Visit: Payer: Self-pay

## 2020-10-10 DIAGNOSIS — Z87891 Personal history of nicotine dependence: Secondary | ICD-10-CM | POA: Diagnosis not present

## 2020-10-10 DIAGNOSIS — Z122 Encounter for screening for malignant neoplasm of respiratory organs: Secondary | ICD-10-CM | POA: Insufficient documentation

## 2020-10-18 ENCOUNTER — Encounter: Payer: Self-pay | Admitting: *Deleted

## 2021-02-07 ENCOUNTER — Other Ambulatory Visit: Payer: Self-pay | Admitting: Family Medicine

## 2021-02-14 ENCOUNTER — Other Ambulatory Visit: Payer: Self-pay | Admitting: Family Medicine

## 2021-02-14 ENCOUNTER — Ambulatory Visit (INDEPENDENT_AMBULATORY_CARE_PROVIDER_SITE_OTHER): Payer: Medicare HMO | Admitting: Family Medicine

## 2021-02-14 ENCOUNTER — Other Ambulatory Visit: Payer: Self-pay

## 2021-02-14 ENCOUNTER — Encounter: Payer: Self-pay | Admitting: Family Medicine

## 2021-02-14 VITALS — BP 131/66 | HR 48 | Temp 97.9°F | Ht 65.0 in | Wt 201.0 lb

## 2021-02-14 DIAGNOSIS — N529 Male erectile dysfunction, unspecified: Secondary | ICD-10-CM | POA: Diagnosis not present

## 2021-02-14 DIAGNOSIS — I129 Hypertensive chronic kidney disease with stage 1 through stage 4 chronic kidney disease, or unspecified chronic kidney disease: Secondary | ICD-10-CM

## 2021-02-14 DIAGNOSIS — E782 Mixed hyperlipidemia: Secondary | ICD-10-CM

## 2021-02-14 DIAGNOSIS — Z23 Encounter for immunization: Secondary | ICD-10-CM

## 2021-02-14 MED ORDER — METOPROLOL TARTRATE 25 MG PO TABS: 25.0000 mg | ORAL_TABLET | Freq: Two times a day (BID) | ORAL | 1 refills | Status: DC

## 2021-02-14 MED ORDER — TAMSULOSIN HCL 0.4 MG PO CAPS: 0.4000 mg | ORAL_CAPSULE | Freq: Every day | ORAL | 1 refills | Status: DC

## 2021-02-14 MED ORDER — TADALAFIL 20 MG PO TABS: 10.0000 mg | ORAL_TABLET | ORAL | 11 refills | Status: DC | PRN

## 2021-02-14 MED ORDER — LISINOPRIL 40 MG PO TABS: 40.0000 mg | ORAL_TABLET | Freq: Every day | ORAL | 1 refills | Status: DC

## 2021-02-14 MED ORDER — SIMVASTATIN 40 MG PO TABS: 40.0000 mg | ORAL_TABLET | Freq: Every day | ORAL | 1 refills | Status: DC

## 2021-02-14 NOTE — Assessment & Plan Note (Signed)
Under good control on current regimen. Continue current regimen. Continue to monitor. Call with any concerns. Refills given. Labs drawn today.   

## 2021-02-14 NOTE — Progress Notes (Signed)
BP 131/66   Pulse (!) 48   Temp 97.9 F (36.6 C) (Oral)   Ht _0  (1.651 m)   Wt 201 lb (91.2 kg)   SpO2 95%   BMI 33.45 kg/m    Subjective:    Patient ID: Mike [name]., male    DOB: 1955/11/07, 65 y.o.   MRN: [id number]  HPI: Dupree Givler [name] Mike [name]. is a 65 y.o. male  Chief Complaint  Patient presents with   Hyperlipidemia   Hypertension   HYPERTENSION / [place] Satisfied with current treatment? yes Duration of hypertension: chronic BP monitoring frequency: not checking BP range:  BP medication side effects: no Past BP meds: metoprolol, lisinopril Duration of hyperlipidemia: chronic Cholesterol medication side effects: no Cholesterol supplements: none Past cholesterol medications: simvastatin Medication compliance: excellent compliance Aspirin: yes Recent stressors: no Recurrent headaches: no Visual changes: no Palpitations: no Dyspnea: no Chest pain: no Lower extremity edema: no Dizzy/lightheaded: no  Relevant past medical, surgical, family and social history reviewed and updated as indicated. Interim medical history since our last visit reviewed. Allergies and medications reviewed and updated.  Review of Systems  Constitutional: Negative.   Respiratory: Negative.    Cardiovascular: Negative.   Gastrointestinal: Negative.   Musculoskeletal: Negative.   Neurological: Negative.   Psychiatric/Behavioral: Negative.     Per HPI unless specifically indicated above     Objective:    BP 131/66   Pulse (!) 48   Temp 97.9 F (36.6 C) (Oral)   Ht _1  (1.651 m)   Wt 201 lb (91.2 kg)   SpO2 95%   BMI 33.45 kg/m   Wt Readings from Last 3 Encounters:  02/14/21 201 lb (91.2 kg)  10/10/20 210 lb (95.3 kg)  08/25/20 206 lb (93.4 kg)    Physical Exam Vitals and nursing note reviewed.  Constitutional:      General: He is not in acute distress.    Appearance: Normal appearance. He is not ill-appearing, toxic-appearing or  diaphoretic.  HENT:     Head: Normocephalic and atraumatic.     Right Ear: External ear normal.     Left Ear: External ear normal.     Nose: Nose normal.     Mouth/Throat:     Mouth: Mucous membranes are moist.     Pharynx: Oropharynx is clear.  Eyes:     General: No scleral icterus.       Right eye: No discharge.        Left eye: No discharge.     Extraocular Movements: Extraocular movements intact.     Conjunctiva/sclera: Conjunctivae normal.     Pupils: Pupils are equal, round, and reactive to light.  Cardiovascular:     Rate and Rhythm: Normal rate and regular rhythm.     Pulses: Normal pulses.     Heart sounds: Normal heart sounds. No murmur heard.   No friction rub. No gallop.  Pulmonary:     Effort: Pulmonary effort is normal. No respiratory distress.     Breath sounds: Normal breath sounds. No stridor. No wheezing, rhonchi or rales.  Chest:     Chest wall: No tenderness.  Musculoskeletal:        General: Normal range of motion.     Cervical back: Normal range of motion and neck supple.  Skin:    General: Skin is warm and dry.     Capillary Refill: Capillary refill takes less than 2 seconds.     Coloration: Skin is not  jaundiced or pale.     Findings: No bruising, erythema, lesion or rash.  Neurological:     General: No focal deficit present.     Mental Status: He is alert and oriented to person, place, and time. Mental status is at baseline.  Psychiatric:        Mood and Affect: Mood normal.        Behavior: Behavior normal.        Thought Content: Thought content normal.        Judgment: Judgment normal.    Results for orders placed or performed in visit on 08/14/20  Comprehensive metabolic panel  Result Value Ref Range   Glucose 111 (H) 65 - 99 mg/dL   BUN 21 8 - 27 mg/dL   Creatinine, Ser 1.66 (H) 0.76 - 1.27 mg/dL   eGFR 45 (L) >59 mL/min/1.73   BUN/Creatinine Ratio 13 10 - 24   Sodium 138 134 - 144 mmol/L   Potassium 4.6 3.5 - 5.2 mmol/L   Chloride  102 96 - 106 mmol/L   CO2 21 20 - 29 mmol/L   Calcium 9.4 8.6 - 10.2 mg/dL   Total Protein 7.0 6.0 - 8.5 g/dL   Albumin 4.2 3.8 - 4.8 g/dL   Globulin, Total 2.8 1.5 - 4.5 g/dL   Albumin/Globulin Ratio 1.5 1.2 - 2.2   Bilirubin Total 0.3 0.0 - 1.2 mg/dL   Alkaline Phosphatase 130 (H) 44 - 121 IU/L   AST 36 0 - 40 IU/L   ALT 20 0 - 44 IU/L  CBC with Differential/Platelet  Result Value Ref Range   WBC 5.6 3.4 - 10.8 x10E3/uL   RBC 5.54 4.14 - 5.80 x10E6/uL   Hemoglobin 14.2 13.0 - 17.7 g/dL   Hematocrit 43.7 37.5 - 51.0 %   MCV 79 79 - 97 fL   MCH 25.6 (L) 26.6 - 33.0 pg   MCHC 32.5 31.5 - 35.7 g/dL   RDW 13.0 11.6 - 15.4 %   Platelets 192 150 - 450 x10E3/uL   Neutrophils 43 Not Estab. %   Lymphs 37 Not Estab. %   Monocytes 10 Not Estab. %   Eos 8 Not Estab. %   Basos 1 Not Estab. %   Neutrophils Absolute 2.5 1.4 - 7.0 x10E3/uL   Lymphocytes Absolute 2.1 0.7 - 3.1 x10E3/uL   Monocytes Absolute 0.5 0.1 - 0.9 x10E3/uL   EOS (ABSOLUTE) 0.4 0.0 - 0.4 x10E3/uL   Basophils Absolute 0.1 0.0 - 0.2 x10E3/uL   Immature Granulocytes 1 Not Estab. %   Immature Grans (Abs) 0.0 0.0 - 0.1 x10E3/uL  Lipid Panel w/o Chol/HDL Ratio  Result Value Ref Range   Cholesterol, Total 161 100 - 199 mg/dL   Triglycerides 94 0 - 149 mg/dL   HDL 55 >39 mg/dL   VLDL Cholesterol Cal 17 5 - 40 mg/dL   LDL Chol Calc (NIH) 89 0 - 99 mg/dL  PSA  Result Value Ref Range   Prostate Specific Ag, Serum 0.4 0.0 - 4.0 ng/mL  TSH  Result Value Ref Range   TSH 1.490 0.450 - 4.500 uIU/mL  Urinalysis, Routine w reflex microscopic  Result Value Ref Range   Specific Gravity, UA >1.030 (H) 1.005 - 1.030   pH, UA 5.5 5.0 - 7.5   Color, UA Yellow Yellow   Appearance Ur Clear Clear   Leukocytes,UA Negative Negative   Protein,UA Negative Negative/Trace   Glucose, UA Negative Negative   Ketones, UA Negative Negative   RBC,  UA Negative Negative   Bilirubin, UA Negative Negative   Urobilinogen, Ur 0.2 0.2 - 1.0 mg/dL    Nitrite, UA Negative Negative  Microalbumin, Urine Waived  Result Value Ref Range   Microalb, Ur Waived 10 0 - 19 mg/L   Creatinine, Urine Waived 300 10 - 300 mg/dL   Microalb/Creat Ratio <30 <30 mg/g      Assessment & Plan:   Problem List Items Addressed This Visit       Genitourinary   Benign hypertensive renal disease - Primary    Under good control on current regimen. Continue current regimen. Continue to monitor. Call with any concerns. Refills given. Labs drawn today.       Relevant Orders   Comprehensive metabolic panel   Lipid Panel w/o Chol/HDL Ratio     Other   HLD (hyperlipidemia)    Under good control on current regimen. Continue current regimen. Continue to monitor. Call with any concerns. Refills given. Labs drawn today.      Relevant Medications   simvastatin (ZOCOR) 40 MG tablet   metoprolol tartrate (LOPRESSOR) 25 MG tablet   lisinopril (ZESTRIL) 40 MG tablet   tadalafil (CIALIS) 20 MG tablet   Other Relevant Orders   Comprehensive metabolic panel   Lipid Panel w/o Chol/HDL Ratio   Other Visit Diagnoses     Erectile dysfunction, unspecified erectile dysfunction type       Will try cialis. Call with any concerns.    Need for influenza vaccination       Relevant Orders   Flu Vaccine QUAD 79moIM (Fluarix, Fluzone & Alfiuria Quad PF) (Completed)        Follow up plan: Return in about 6 months (around 08/14/2021), or physical.

## 2021-02-15 LAB — COMPREHENSIVE METABOLIC PANEL
ALT: 17 IU/L (ref 0–44)
AST: 31 IU/L (ref 0–40)
Albumin/Globulin Ratio: 1.6 (ref 1.2–2.2)
Albumin: 4.1 g/dL (ref 3.8–4.8)
Alkaline Phosphatase: 126 IU/L — ABNORMAL HIGH (ref 44–121)
BUN/Creatinine Ratio: 16 (ref 10–24)
BUN: 22 mg/dL (ref 8–27)
Bilirubin Total: 0.3 mg/dL (ref 0.0–1.2)
CO2: 22 mmol/L (ref 20–29)
Calcium: 9.2 mg/dL (ref 8.6–10.2)
Chloride: 104 mmol/L (ref 96–106)
Creatinine, Ser: 1.39 mg/dL — ABNORMAL HIGH (ref 0.76–1.27)
Globulin, Total: 2.6 g/dL (ref 1.5–4.5)
Glucose: 98 mg/dL (ref 70–99)
Potassium: 4.6 mmol/L (ref 3.5–5.2)
Sodium: 140 mmol/L (ref 134–144)
Total Protein: 6.7 g/dL (ref 6.0–8.5)
eGFR: 56 mL/min/{1.73_m2} — ABNORMAL LOW (ref >59)

## 2021-02-15 LAB — LIPID PANEL W/O CHOL/HDL RATIO
Cholesterol, Total: 135 mg/dL (ref 100–199)
HDL: 52 mg/dL (ref >39)
LDL Chol Calc (NIH): 73 mg/dL (ref 0–99)
Triglycerides: 45 mg/dL (ref 0–149)
VLDL Cholesterol Cal: 10 mg/dL (ref 5–40)

## 2021-02-16 ENCOUNTER — Encounter: Payer: Self-pay | Admitting: Family Medicine

## 2021-08-14 NOTE — Progress Notes (Signed)
? ?BP 133/67   Pulse (!) 47   Temp 97.6 ?F (36.4 ?C)   Wt 205 lb 6.4 oz (93.2 kg)   SpO2 99%   BMI 34.18 kg/m?   ? ?Subjective:  ? ? Patient ID: Mike [name]., [name]    DOB: 05-23-1955, 66 y.o.   MRN: [id number] ? ?HPI: ?Mike Asch. is a 66 y.o. [name] presenting on 08/15/2021 for comprehensive medical examination. Current medical complaints include: ? ?HYPERTENSION / HYPERLIPIDEMIA ?Satisfied with current treatment? yes ?Duration of hypertension: chronic ?BP monitoring frequency: not checking ?BP medication side effects: no ?Past BP meds: lisinopril, metoprolol ?Duration of hyperlipidemia: chronic ?Cholesterol medication side effects: no ?Cholesterol supplements: none ?Past cholesterol medications: simvastatin ?Medication compliance: excellent compliance ?Aspirin: yes ?Recent stressors: no ?Recurrent headaches: no ?Visual changes: no ?Palpitations: no ?Dyspnea: no ?Chest pain: no ?Lower extremity edema: no ?Dizzy/lightheaded: no ? ?COPD ?COPD status: controlled ?Satisfied with current treatment?: yes ?Oxygen use: no ?Dyspnea frequency: rarely ?Cough frequency: rarely ?Rescue inhaler frequency: rarely   ?Limitation of activity: no ?Pneumovax: Up to Date ?Influenza: Up to Date ? ?BPH ?BPH status: controlled ?Satisfied with current treatment?: yes ?Medication side effects: no ?Medication compliance: excellent compliance ?Duration: chronic ?Nocturia: 1/night ?Urinary frequency:no ?Incomplete voiding: no ?Urgency: no ?Weak urinary stream: no ?Straining to start stream: no ?Dysuria: no ?Onset: gradual ?Severity: mild ? ?He currently lives with: alone ?Interim Problems from his last visit: no ? ?Depression Screen done today and results listed below:  ? ?  08/15/2021  ?  9:06 AM 08/25/2020  ?  9:03 AM 08/14/2020  ?  9:09 AM 08/12/2019  ?  9:25 AM 04/10/2018  ? 10:23 AM  ?Depression screen PHQ 2/9  ?Decreased Interest 0 0 0 0 0  ?Down, Depressed, Hopeless 0 0 0 0 0  ?PHQ - 2 Score 0 0 0 0 0  ?Altered  sleeping 0    1  ?Tired, decreased energy 0    0  ?Change in appetite 0    0  ?Feeling bad or failure about yourself  0    0  ?Trouble concentrating 0    0  ?Moving slowly or fidgety/restless 0    0  ?Suicidal thoughts 0    0  ?PHQ-9 Score 0    1  ?Difficult doing work/chores     Not difficult at all  ? ? ?Past Medical History:  ?Past Medical History:  ?Diagnosis Date  ? Arthritis   ? Benign enlargement of prostate   ? CAD (coronary artery disease)   ? Chronic kidney disease   ? History of seizures as a child   ? Due to head injury  ? Hyperlipidemia   ? Hypertension   ? Lumbago   ? Myocardial infarction [facility])   ? Urinary hesitancy   ? ? ?Surgical History:  ?Past Surgical History:  ?Procedure Laterality Date  ? COLONOSCOPY WITH PROPOFOL N/A 10/01/2019  ? Procedure: COLONOSCOPY WITH PROPOFOL;  Surgeon: Lin Landsman, MD;  Location: [facility] ENDOSCOPY;  Service: Gastroenterology;  Laterality: N/A;  ? coronary artery stent    ? ? ?Medications:  ?Current Outpatient Medications on File Prior to Visit  ?Medication Sig  ? aspirin EC 81 MG tablet Take 81 mg by mouth daily.   ? ?No current facility-administered medications on file prior to visit.  ? ? ?Allergies:  ?Allergies  ?Allergen Reactions  ? Shellfish Allergy Anaphylaxis  ? Peanuts [Peanut Oil] Rash  ? Geralyn Flash [Fish Allergy] Hives  ? ? ?  Social History:  ?Social History  ? ?Socioeconomic History  ? Marital status: Divorced  ?  Spouse name: Not on file  ? Number of children: Not on file  ? Years of education: Not on file  ? Highest education level: Some college, no degree  ?Occupational History  ? Occupation: retired  ?Tobacco Use  ? Smoking status: Former  ?  Packs/day: 1.00  ?  Years: 30.00  ?  Pack years: 30.00  ?  Types: Cigarettes  ?  Quit date: 04/12/2016  ?  Years since quitting: 5.3  ? Smokeless tobacco: Never  ? Tobacco comments:  ?  since 16, max 1 ppd x10  ?Vaping Use  ? Vaping Use: Never used  ?Substance and Sexual Activity  ? Alcohol use: No  ?  Alcohol/week:  0.0 standard drinks  ? Drug use: No  ? Sexual activity: Yes  ?  Birth control/protection: None  ?Other Topics Concern  ? Not on file  ?Social History Narrative  ? 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 ?Social Determinants of Health  ? ?Financial Resource Strain: Low Risk   ? Difficulty of Paying Living Expenses: Not hard at all  ?Food Insecurity: No Food Insecurity  ? Worried About Charity fundraiser in the Last Year: Never true  ? Ran Out of Food in the Last Year: Never true  ?Transportation Needs: No Transportation Needs  ? Lack of Transportation (Medical): No  ? Lack of Transportation (Non-Medical): No  ?Physical Activity: Sufficiently Active  ? Days of Exercise per Week: 6 days  ? Minutes of Exercise per Session: 90 min  ?Stress: No Stress Concern Present  ? Feeling of Stress : Not at all  ?Social Connections: Not on file  ?Intimate Partner Violence: Not on file  ? ?Social History  ? ?Tobacco Use  ?Smoking Status Former  ? Packs/day: 1.00  ? Years: 30.00  ? Pack years: 30.00  ? Types: Cigarettes  ? Quit date: 04/12/2016  ? Years since quitting: 5.3  ?Smokeless Tobacco Never  ?Tobacco Comments  ? since 15, max 1 ppd x10  ? ?Social History  ? ?Substance and Sexual Activity  ?Alcohol Use No  ? Alcohol/week: 0.0 standard drinks  ? ? ?Family History:  ?Family History  ?Problem Relation Age of Onset  ? Hypertension Mother   ? Breast cancer Mother   ? Hypertension Father   ? Stroke Father   ? Stroke Maternal Grandfather   ? ? ?Past medical history, surgical history, medications, allergies, family history and social history reviewed with patient today and changes made to appropriate areas of the chart.  ? ?Review of Systems  ?Constitutional: Negative.   ?HENT: Negative.    ?Eyes: Negative.   ?Respiratory: Negative.    ?Cardiovascular: Negative.   ?Gastrointestinal: Negative.   ?Genitourinary: Negative.   ?Musculoskeletal: Negative.   ?Skin: Negative.   ?Neurological: Negative.   ?Endo/Heme/Allergies:  Negative.   ?Psychiatric/Behavioral: Negative.    ?All other ROS negative except what is listed above and in the HPI.  ? ?   ?Objective:  ?  ?BP 133/67   Pulse (!) 47   Temp 97.6 ?F (36.4 ?C)   Wt 205 lb 6.4 oz (93.2 kg)   SpO2 99%   BMI 34.18 kg/m?   ?Wt Readings from Last 3 Encounters:  ?08/15/21 205 lb 6.4 oz (93.2 kg)  ?02/14/21 201 lb (91.2 kg)  ?10/10/20 210 lb (95.3 kg)  ?  ?  Physical Exam ?Vitals and nursing note reviewed.  ?Constitutional:   ?   General: He is not in acute distress. ?   Appearance: Normal appearance. He is obese. He is not ill-appearing, toxic-appearing or diaphoretic.  ?HENT:  ?   Head: Normocephalic and atraumatic.  ?   Right Ear: Tympanic membrane, ear canal and external ear normal. There is no impacted cerumen.  ?   Left Ear: Tympanic membrane, ear canal and external ear normal. There is no impacted cerumen.  ?   Nose: Nose normal. No congestion or rhinorrhea.  ?   Mouth/Throat:  ?   Mouth: Mucous membranes are moist.  ?   Pharynx: Oropharynx is clear. No oropharyngeal exudate or posterior oropharyngeal erythema.  ?Eyes:  ?   General: No scleral icterus.    ?   Right eye: No discharge.     ?   Left eye: No discharge.  ?   Extraocular Movements: Extraocular movements intact.  ?   Conjunctiva/sclera: Conjunctivae normal.  ?   Pupils: Pupils are equal, round, and reactive to light.  ?Neck:  ?   Vascular: No carotid bruit.  ?Cardiovascular:  ?   Rate and Rhythm: Normal rate and regular rhythm.  ?   Pulses: Normal pulses.  ?   Heart sounds: No murmur heard. ?  No friction rub. No gallop.  ?Pulmonary:  ?   Effort: Pulmonary effort is normal. No respiratory distress.  ?   Breath sounds: Normal breath sounds. No stridor. No wheezing, rhonchi or rales.  ?Chest:  ?   Chest wall: No tenderness.  ?Abdominal:  ?   General: Abdomen is flat. Bowel sounds are normal. There is no distension.  ?   Palpations: Abdomen is soft. There is no mass.  ?   Tenderness: There is no abdominal tenderness. There  is no right CVA tenderness, left CVA tenderness, guarding or rebound.  ?   Hernia: No hernia is present.  ?Genitourinary: ?   Comments: Genital exam deferred with shared decision making ?Musculoskeletal:     ?   Gen

## 2021-08-15 ENCOUNTER — Other Ambulatory Visit: Payer: Self-pay

## 2021-08-15 ENCOUNTER — Encounter: Payer: Self-pay | Admitting: Family Medicine

## 2021-08-15 ENCOUNTER — Telehealth: Payer: Self-pay | Admitting: Family Medicine

## 2021-08-15 ENCOUNTER — Ambulatory Visit (INDEPENDENT_AMBULATORY_CARE_PROVIDER_SITE_OTHER): Payer: Medicare HMO | Admitting: Family Medicine

## 2021-08-15 VITALS — BP 133/67 | HR 47 | Temp 97.6°F | Wt 205.4 lb

## 2021-08-15 DIAGNOSIS — J449 Chronic obstructive pulmonary disease, unspecified: Secondary | ICD-10-CM | POA: Diagnosis not present

## 2021-08-15 DIAGNOSIS — Z87891 Personal history of nicotine dependence: Secondary | ICD-10-CM | POA: Diagnosis not present

## 2021-08-15 DIAGNOSIS — Z0000 Encounter for general adult medical examination without abnormal findings: Secondary | ICD-10-CM | POA: Diagnosis not present

## 2021-08-15 DIAGNOSIS — N401 Enlarged prostate with lower urinary tract symptoms: Secondary | ICD-10-CM

## 2021-08-15 DIAGNOSIS — E782 Mixed hyperlipidemia: Secondary | ICD-10-CM | POA: Diagnosis not present

## 2021-08-15 DIAGNOSIS — I2510 Atherosclerotic heart disease of native coronary artery without angina pectoris: Secondary | ICD-10-CM | POA: Diagnosis not present

## 2021-08-15 DIAGNOSIS — I129 Hypertensive chronic kidney disease with stage 1 through stage 4 chronic kidney disease, or unspecified chronic kidney disease: Secondary | ICD-10-CM | POA: Diagnosis not present

## 2021-08-15 DIAGNOSIS — R3911 Hesitancy of micturition: Secondary | ICD-10-CM | POA: Diagnosis not present

## 2021-08-15 DIAGNOSIS — N183 Chronic kidney disease, stage 3 unspecified: Secondary | ICD-10-CM | POA: Diagnosis not present

## 2021-08-15 LAB — MICROALBUMIN, URINE WAIVED
Creatinine, Urine Waived: 300 mg/dL (ref 10–300)
Microalb, Ur Waived: 30 mg/L — ABNORMAL HIGH (ref 0–19)
Microalb/Creat Ratio: <30 mg/g (ref <30)

## 2021-08-15 LAB — URINALYSIS, ROUTINE W REFLEX MICROSCOPIC
Bilirubin, UA: NEGATIVE
Glucose, UA: NEGATIVE
Ketones, UA: NEGATIVE
Leukocytes,UA: NEGATIVE
Nitrite, UA: NEGATIVE
Protein,UA: NEGATIVE
RBC, UA: NEGATIVE
Specific Gravity, UA: 1.025 (ref 1.005–1.030)
Urobilinogen, Ur: 0.2 mg/dL (ref 0.2–1.0)
pH, UA: 5.5 (ref 5.0–7.5)

## 2021-08-15 MED ORDER — METOPROLOL TARTRATE 25 MG PO TABS: 25.0000 mg | ORAL_TABLET | Freq: Two times a day (BID) | ORAL | 1 refills | Status: DC

## 2021-08-15 MED ORDER — EPINEPHRINE 0.3 MG/0.3ML IJ SOAJ
0.3000 mg | INTRAMUSCULAR | 12 refills | Status: DC | PRN
Start: 2021-08-15 — End: 2022-09-10

## 2021-08-15 MED ORDER — TADALAFIL 20 MG PO TABS: 10.0000 mg | ORAL_TABLET | ORAL | 11 refills | Status: DC | PRN

## 2021-08-15 MED ORDER — TAMSULOSIN HCL 0.4 MG PO CAPS: 0.4000 mg | ORAL_CAPSULE | Freq: Every day | ORAL | 1 refills | Status: DC

## 2021-08-15 MED ORDER — SIMVASTATIN 40 MG PO TABS: 40.0000 mg | ORAL_TABLET | Freq: Every day | ORAL | 1 refills | Status: DC

## 2021-08-15 MED ORDER — ALBUTEROL SULFATE HFA 108 (90 BASE) MCG/ACT IN AERS: INHALATION_SPRAY | RESPIRATORY_TRACT | 6 refills | Status: DC

## 2021-08-15 MED ORDER — LISINOPRIL 40 MG PO TABS
40.0000 mg | ORAL_TABLET | Freq: Every day | ORAL | 1 refills | Status: DC
Start: 2021-08-15 — End: 2022-02-15

## 2021-08-15 NOTE — Assessment & Plan Note (Signed)
Labs drawn today. Await results. Treat as needed.  

## 2021-08-15 NOTE — Telephone Encounter (Signed)
Called [facility] and cancelled Cialis Rx as requested by the patient. ? ?Called and notified patient of this and let him know that all other medications were refilled at this mornings appointment.  ?

## 2021-08-15 NOTE — Assessment & Plan Note (Signed)
Under good control on current regimen. Continue current regimen. Continue to monitor. Call with any concerns. Refills given.   

## 2021-08-15 NOTE — Telephone Encounter (Signed)
Pt saw Dr Wynetta Emery this morning and had his meds refilled.  He wants her to know that he no longer needs the ?tadalafil (CIALIS) 20 MG tablet ?Please cancel request for this medication. ?

## 2021-08-15 NOTE — Assessment & Plan Note (Signed)
Under good control on current regimen. Continue current regimen. Continue to monitor. Call with any concerns. Refills given. Labs drawn today.   

## 2021-08-15 NOTE — Assessment & Plan Note (Signed)
Labs drawn today. Await results.  

## 2021-08-15 NOTE — Assessment & Plan Note (Signed)
Will keep BP and cholesterol under good control. Continue to monitor. Call with any concerns.  

## 2021-08-16 LAB — CBC WITH DIFFERENTIAL/PLATELET
Basophils Absolute: 0.1 10*3/uL (ref 0.0–0.2)
Basos: 1 %
EOS (ABSOLUTE): 0.4 10*3/uL (ref 0.0–0.4)
Eos: 7 %
Hematocrit: 42.3 % (ref 37.5–51.0)
Hemoglobin: 13.6 g/dL (ref 13.0–17.7)
Immature Grans (Abs): 0 10*3/uL (ref 0.0–0.1)
Immature Granulocytes: 1 %
Lymphocytes Absolute: 2.4 10*3/uL (ref 0.7–3.1)
Lymphs: 40 %
MCH: 25.4 pg — ABNORMAL LOW (ref 26.6–33.0)
MCHC: 32.2 g/dL (ref 31.5–35.7)
MCV: 79 fL (ref 79–97)
Monocytes Absolute: 0.6 10*3/uL (ref 0.1–0.9)
Monocytes: 10 %
Neutrophils Absolute: 2.4 10*3/uL (ref 1.4–7.0)
Neutrophils: 41 %
Platelets: 188 10*3/uL (ref 150–450)
RBC: 5.35 x10E6/uL (ref 4.14–5.80)
RDW: 14 % (ref 11.6–15.4)
WBC: 6 10*3/uL (ref 3.4–10.8)

## 2021-08-16 LAB — COMPREHENSIVE METABOLIC PANEL
ALT: 19 IU/L (ref 0–44)
AST: 38 IU/L (ref 0–40)
Albumin/Globulin Ratio: 1.7 (ref 1.2–2.2)
Albumin: 4.1 g/dL (ref 3.8–4.8)
Alkaline Phosphatase: 137 IU/L — ABNORMAL HIGH (ref 44–121)
BUN/Creatinine Ratio: 12 (ref 10–24)
BUN: 18 mg/dL (ref 8–27)
Bilirubin Total: 0.2 mg/dL (ref 0.0–1.2)
CO2: 26 mmol/L (ref 20–29)
Calcium: 9.3 mg/dL (ref 8.6–10.2)
Chloride: 103 mmol/L (ref 96–106)
Creatinine, Ser: 1.5 mg/dL — ABNORMAL HIGH (ref 0.76–1.27)
Globulin, Total: 2.4 g/dL (ref 1.5–4.5)
Glucose: 109 mg/dL — ABNORMAL HIGH (ref 70–99)
Potassium: 4.4 mmol/L (ref 3.5–5.2)
Sodium: 140 mmol/L (ref 134–144)
Total Protein: 6.5 g/dL (ref 6.0–8.5)
eGFR: 51 mL/min/{1.73_m2} — ABNORMAL LOW (ref >59)

## 2021-08-16 LAB — LIPID PANEL W/O CHOL/HDL RATIO
Cholesterol, Total: 145 mg/dL (ref 100–199)
HDL: 58 mg/dL (ref >39)
LDL Chol Calc (NIH): 77 mg/dL (ref 0–99)
Triglycerides: 42 mg/dL (ref 0–149)
VLDL Cholesterol Cal: 10 mg/dL (ref 5–40)

## 2021-08-16 LAB — TSH: TSH: 1.45 u[IU]/mL (ref 0.450–4.500)

## 2021-08-16 LAB — PSA: Prostate Specific Ag, Serum: 0.4 ng/mL (ref 0.0–4.0)

## 2021-08-17 ENCOUNTER — Encounter: Payer: Self-pay | Admitting: Family Medicine

## 2021-08-27 ENCOUNTER — Ambulatory Visit (INDEPENDENT_AMBULATORY_CARE_PROVIDER_SITE_OTHER): Payer: Medicare HMO | Admitting: *Deleted

## 2021-08-27 DIAGNOSIS — Z0000 Encounter for general adult medical examination without abnormal findings: Secondary | ICD-10-CM

## 2021-08-27 NOTE — Progress Notes (Signed)
? ?Subjective:  ? Mike [name]. is a 66 y.o. male who presents for Medicare Annual/Subsequent preventive examination. ? ?I connected with  Mike [name]. on 08/27/21 by a telephone enabled telemedicine application and verified that I am speaking with the correct person using two identifiers. ?  ?I discussed the limitations of evaluation and management by telemedicine. The patient expressed understanding and agreed to proceed. ? ?Patient location: home ? ?Provider location: Tele-Health not in office ? ? ?Review of Systems    ? ?Cardiac Risk Factors include: advanced age (>74mn, >>62women);hypertension;male gender ? ?   ?Objective:  ?  ?Today's Vitals  ? ?There is no height or weight on file to calculate BMI. ? ? ?  08/27/2021  ?  9:04 AM 08/25/2020  ?  9:02 AM 10/01/2019  ? 10:28 AM 02/25/2018  ?  9:52 AM 02/21/2017  ? 10:49 AM 04/02/2016  ? 10:50 AM  ?Advanced Directives  ?Does Patient Have a Medical Advance Directive? No No No No No No  ?Would patient like information on creating a medical advance directive? No - Patient declined   Yes (MAU/Ambulatory/Procedural Areas - Information given) No - Patient declined Yes - Educational materials given  ? ? ?Current Medications (verified) ?Outpatient Encounter Medications as of 08/27/2021  ?Medication Sig  ? albuterol (VENTOLIN HFA) 108 (90 Base) MCG/ACT inhaler INHALE 2 PUFFS INTO THE LUNGS EVERY 6  HOURS AS NEEDED FOR WHEEZING OR SHORTNESS OF BREATH.  ? aspirin EC 81 MG tablet Take 81 mg by mouth daily.   ? EPINEPHrine 0.3 mg/0.3 mL IJ SOAJ injection Inject 0.3 mg into the muscle as needed for anaphylaxis.  ? lisinopril (ZESTRIL) 40 MG tablet Take 1 tablet (40 mg total) by mouth daily.  ? metoprolol tartrate (LOPRESSOR) 25 MG tablet Take 1 tablet (25 mg total) by mouth 2 (two) times daily.  ? simvastatin (ZOCOR) 40 MG tablet Take 1 tablet (40 mg total) by mouth daily.  ? tadalafil (CIALIS) 20 MG tablet Take 0.5-1 tablets (10-20 mg total) by mouth every  other day as needed for erectile dysfunction.  ? tamsulosin (FLOMAX) 0.4 MG CAPS capsule Take 1 capsule (0.4 mg total) by mouth daily.  ? ?No facility-administered encounter medications on file as of 08/27/2021.  ? ? ?Allergies (verified) ?Shellfish allergy, Peanuts [peanut oil], and Tuna [fish allergy]  ? ?History: ?Past Medical History:  ?Diagnosis Date  ? Arthritis   ? Benign enlargement of prostate   ? CAD (coronary artery disease)   ? Chronic kidney disease   ? History of seizures as a child   ? Due to head injury  ? Hyperlipidemia   ? Hypertension   ? Lumbago   ? Myocardial infarction ([facility]   ? Urinary hesitancy   ? ?Past Surgical History:  ?Procedure Laterality Date  ? COLONOSCOPY WITH PROPOFOL N/A 10/01/2019  ? Procedure: COLONOSCOPY WITH PROPOFOL;  Surgeon: VLin Landsman MD;  [facility];  Service: Gastroenterology;  Laterality: N/A;  ? coronary artery stent    ? ?Family History  ?Problem Relation Age of Onset  ? Hypertension Mother   ? Breast cancer Mother   ? Hypertension Father   ? Stroke Father   ? Stroke Maternal Grandfather   ? ?Social History  ? ?Socioeconomic History  ? Marital status: Divorced  ?  Spouse name: Not on file  ? Number of children: Not on file  ? Years of education: Not on file  ? Highest education level: Some college,  no degree  ?Occupational History  ? Occupation: retired  ?Tobacco Use  ? Smoking status: Former  ?  Packs/day: 1.00  ?  Years: 30.00  ?  Pack years: 30.00  ?  Types: Cigarettes  ?  Quit date: 04/12/2016  ?  Years since quitting: 5.3  ? Smokeless tobacco: Never  ? Tobacco comments:  ?  since 15, max 1 ppd x10  ?Vaping Use  ? Vaping Use: Never used  ?Substance and Sexual Activity  ? Alcohol use: No  ?  Alcohol/week: 0.0 standard drinks  ? Drug use: No  ? Sexual activity: Yes  ?  Birth control/protection: None  ?Other Topics Concern  ? Not on file  ?Social History Narrative  ? 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 ?Social Determinants of  Health  ? ?Financial Resource Strain: Low Risk   ? Difficulty of Paying Living Expenses: Not very hard  ?Food Insecurity: No Food Insecurity  ? Worried About Charity fundraiser in the Last Year: Never true  ? Ran Out of Food in the Last Year: Never true  ?Transportation Needs: No Transportation Needs  ? Lack of Transportation (Medical): No  ? Lack of Transportation (Non-Medical): No  ?Physical Activity: Sufficiently Active  ? Days of Exercise per Week: 7 days  ? Minutes of Exercise per Session: 70 min  ?Stress: No Stress Concern Present  ? Feeling of Stress : Not at all  ?Social Connections: Moderately Isolated  ? Frequency of Communication with Friends and Family: More than three times a week  ? Frequency of Social Gatherings with Friends and Family: Once a week  ? Attends Religious Services: Never  ? Active Member of Clubs or Organizations: Yes  ? Attends Archivist Meetings: More than 4 times per year  ? Marital Status: Divorced  ? ? ?Tobacco Counseling ?Counseling given: Not Answered ?Tobacco comments: since 15, max 1 ppd x10 ? ? ?Clinical Intake: ? ?Pre-visit preparation completed: Yes ? ?Pain : No/denies pain ? ?  ? ?Nutritional Risks: None ?Diabetes: No ? ?How often do you need to have someone help you when you read instructions, pamphlets, or other written materials from your doctor or pharmacy?: 1 - Never ? ?Diabetic?  no ? ?Interpreter Needed?: No ? ?Information entered by :: Mike Kennedy LPN ? ? ?Activities of Daily Living ? ?  08/27/2021  ?  9:05 AM 08/15/2021  ?  9:01 AM  ?In your present state of health, do you have any difficulty performing the following activities:  ?Hearing? 0 0  ?Vision? 0 0  ?Difficulty concentrating or making decisions? 0 0  ?Walking or climbing stairs? 0 0  ?Dressing or bathing? 0 0  ?Doing errands, shopping? 0 0  ?Preparing Food and eating ? N   ?Using the Toilet? N   ?In the past six months, have you accidently leaked urine? N   ?Do you have problems with loss of bowel  control? N   ?Managing your Medications? N   ?Managing your Finances? N   ?Housekeeping or managing your Housekeeping? N   ? ? ?Patient Care Team: ?Valerie Roys, DO as PCP - General (Family Medicine) ? ?Indicate any recent Medical Services you may have received from other than [facility] providers in the past year (date may be approximate). ? ?   ?Assessment:  ? This is a routine wellness examination for Mike [name]. ? ?Hearing/Vision screen ?Hearing Screening - Comments:: No trouble hearing ?  Vision Screening - Comments:: Not up to date ? ?Dietary issues and exercise activities discussed: ?Current Exercise Habits: Structured exercise class, Type of exercise: strength training/weights;treadmill;stretching, Time (Minutes): > 60, Frequency (Times/Week): 7, Weekly Exercise (Minutes/Week): 0, Intensity: Intense, Exercise limited by: None identified ? ? Goals Addressed   ? ?  ?  ?  ?  ? This Visit's Progress  ?  Patient Stated     ?  Stay healthy ?  ? ?  ? ?Depression Screen ? ?  08/27/2021  ?  9:07 AM 08/15/2021  ?  9:06 AM 08/25/2020  ?  9:03 AM 08/14/2020  ?  9:09 AM 08/12/2019  ?  9:25 AM 04/10/2018  ? 10:23 AM 02/25/2018  ?  9:51 AM  ?PHQ 2/9 Scores  ?PHQ - 2 Score 0 0 0 0 0 0 0  ?PHQ- 9 Score 0 0    1   ?  ?Fall Risk ? ?  08/27/2021  ?  9:05 AM 08/15/2021  ?  9:06 AM 08/15/2021  ?  9:01 AM 08/25/2020  ?  9:03 AM 08/14/2020  ?  9:09 AM  ?Fall Risk   ?Falls in the past year? 1 0 0 0 0  ?Number falls in past yr: 0 0 0  0  ?Injury with Fall? 0 0 0  0  ?Risk for fall due to :  No Fall Risks No Fall Risks Medication side effect No Fall Risks  ?Follow up Falls evaluation completed;Education provided;Falls prevention discussed Falls evaluation completed Falls evaluation completed Falls evaluation completed;Education provided;Falls prevention discussed Falls evaluation completed  ? ? ?FALL RISK PREVENTION PERTAINING TO THE HOME: ? ?Any stairs in or around the home? No  ?If so, are there any without handrails? No  ?Home free of loose throw rugs  in walkways, pet beds, electrical cords, etc? Yes  ?Adequate lighting in your home to reduce risk of falls? Yes  ? ?ASSISTIVE DEVICES UTILIZED TO PREVENT FALLS: ? ?Life alert? No  ?Use of a cane, walker

## 2021-08-27 NOTE — Patient Instructions (Signed)
[name] , ?Thank you for taking time to come for your Medicare Wellness Visit. I appreciate your ongoing commitment to your health goals. Please review the following plan we discussed and let me know if I can assist you in the future.  ? ?Screening recommendations/referrals: ?Colonoscopy: up to date ?Recommended yearly ophthalmology/optometry visit for glaucoma screening and checkup ?Recommended yearly dental visit for hygiene and checkup ? ?Vaccinations: ?Influenza vaccine: up to date ?Pneumococcal vaccine: up to date ?Tdap vaccine: up to date ?Shingles vaccine: Education provided   ? ?Advanced directives: Education provided ? ?Conditions/risks identified:  ? ?Next appointment: 02-15-2022 @ 2:00 Mike [name] ? ?Preventive Care 23 Years and Older, Male ?Preventive care refers to lifestyle choices and visits with your health care provider that can promote health and wellness. ?What does preventive care include? ?A yearly physical exam. This is also called an annual well check. ?Dental exams once or twice a year. ?Routine eye exams. Ask your health care provider how often you should have your eyes checked. ?Personal lifestyle choices, including: ?Daily care of your teeth and gums. ?Regular physical activity. ?Eating a healthy diet. ?Avoiding tobacco and drug use. ?Limiting alcohol use. ?Practicing safe sex. ?Taking low doses of aspirin every day. ?Taking vitamin and mineral supplements as recommended by your health care provider. ?What happens during an annual well check? ?The services and screenings done by your health care provider during your annual well check will depend on your age, overall health, lifestyle risk factors, and family history of disease. ?Counseling  ?Your health care provider may ask you questions about your: ?Alcohol use. ?Tobacco use. ?Drug use. ?Emotional well-being. ?Home and relationship well-being. ?Sexual activity. ?Eating habits. ?History of falls. ?Memory and ability to understand  (cognition). ?Work and work Statistician. ?Screening  ?You may have the following tests or measurements: ?Height, weight, and BMI. ?Blood pressure. ?Lipid and cholesterol levels. These may be checked every 5 years, or more frequently if you are over 45 years old. ?Skin check. ?Lung cancer screening. You may have this screening every year starting at age 40 if you have a 30-pack-year history of smoking and currently smoke or have quit within the past 15 years. ?Fecal occult blood test (FOBT) of the stool. You may have this test every year starting at age 39. ?Flexible sigmoidoscopy or colonoscopy. You may have a sigmoidoscopy every 5 years or a colonoscopy every 10 years starting at age 60. ?Prostate cancer screening. Recommendations will vary depending on your family history and other risks. ?Hepatitis C blood test. ?Hepatitis B blood test. ?Sexually transmitted disease (STD) testing. ?Diabetes screening. This is done by checking your blood sugar (glucose) after you have not eaten for a while (fasting). You may have this done every 1-3 years. ?Abdominal aortic aneurysm (AAA) screening. You may need this if you are a current or former smoker. ?Osteoporosis. You may be screened starting at age 64 if you are at high risk. ?Talk with your health care provider about your test results, treatment options, and if necessary, the need for more tests. ?Vaccines  ?Your health care provider may recommend certain vaccines, such as: ?Influenza vaccine. This is recommended every year. ?Tetanus, diphtheria, and acellular pertussis (Tdap, Td) vaccine. You may need a Td booster every 10 years. ?Zoster vaccine. You may need this after age 36. ?Pneumococcal 13-valent conjugate (PCV13) vaccine. One dose is recommended after age 54. ?Pneumococcal polysaccharide (PPSV23) vaccine. One dose is recommended after age 55. ?Talk to your health care provider about which screenings and vaccines  you need and how often you need them. ?This  information is not intended to replace advice given to you by your health care provider. Make sure you discuss any questions you have with your health care provider. ?Document Released: 06/02/2015 Document Revised: 01/24/2016 Document Reviewed: 03/07/2015 ?Elsevier Interactive Patient Education ? 2017 [place]. ? ?Fall Prevention in the Home ?Falls can cause injuries. They can happen to people of all ages. There are many things you can do to make your home safe and to help prevent falls. ?What can I do on the outside of my home? ?Regularly fix the edges of walkways and driveways and fix any cracks. ?Remove anything that might make you trip as you walk through a door, such as a raised step or threshold. ?Trim any bushes or trees on the path to your home. ?Use bright outdoor lighting. ?Clear any walking paths of anything that might make someone trip, such as rocks or tools. ?Regularly check to see if handrails are loose or broken. Make sure that both sides of any steps have handrails. ?Any raised decks and porches should have guardrails on the edges. ?Have any leaves, snow, or ice cleared regularly. ?Use sand or salt on walking paths during winter. ?Clean up any spills in your garage right away. This includes oil or grease spills. ?What can I do in the bathroom? ?Use night lights. ?Install grab bars by the toilet and in the tub and shower. Do not use towel bars as grab bars. ?Use non-skid mats or decals in the tub or shower. ?If you need to sit down in the shower, use a plastic, non-slip stool. ?Keep the floor dry. Clean up any water that spills on the floor as soon as it happens. ?Remove soap buildup in the tub or shower regularly. ?Attach bath mats securely with double-sided non-slip rug tape. ?Do not have throw rugs and other things on the floor that can make you trip. ?What can I do in the bedroom? ?Use night lights. ?Make sure that you have a light by your bed that is easy to reach. ?Do not use any sheets or  blankets that are too big for your bed. They should not hang down onto the floor. ?Have a firm chair that has side arms. You can use this for support while you get dressed. ?Do not have throw rugs and other things on the floor that can make you trip. ?What can I do in the kitchen? ?Clean up any spills right away. ?Avoid walking on wet floors. ?Keep items that you use a lot in easy-to-reach places. ?If you need to reach something above you, use a strong step stool that has a grab bar. ?Keep electrical cords out of the way. ?Do not use floor polish or wax that makes floors slippery. If you must use wax, use non-skid floor wax. ?Do not have throw rugs and other things on the floor that can make you trip. ?What can I do with my stairs? ?Do not leave any items on the stairs. ?Make sure that there are handrails on both sides of the stairs and use them. Fix handrails that are broken or loose. Make sure that handrails are as long as the stairways. ?Check any carpeting to make sure that it is firmly attached to the stairs. Fix any carpet that is loose or worn. ?Avoid having throw rugs at the top or bottom of the stairs. If you do have throw rugs, attach them to the floor with carpet tape. ?Make sure  that you have a light switch at the top of the stairs and the bottom of the stairs. If you do not have them, ask someone to add them for you. ?What else can I do to help prevent falls? ?Wear shoes that: ?Do not have high heels. ?Have rubber bottoms. ?Are comfortable and fit you well. ?Are closed at the toe. Do not wear sandals. ?If you use a stepladder: ?Make sure that it is fully opened. Do not climb a closed stepladder. ?Make sure that both sides of the stepladder are locked into place. ?Ask someone to hold it for you, if possible. ?Clearly mark and make sure that you can see: ?Any grab bars or handrails. ?First and last steps. ?Where the edge of each step is. ?Use tools that help you move around (mobility aids) if they are  needed. These include: ?Canes. ?Walkers. ?Scooters. ?Crutches. ?Turn on the lights when you go into a dark area. Replace any light bulbs as soon as they burn out. ?Set up your furniture so you have a clear path. Avoid movin

## 2021-11-02 ENCOUNTER — Telehealth: Payer: Self-pay | Admitting: Acute Care

## 2021-11-02 NOTE — Telephone Encounter (Signed)
Attempted to reach pt to schedule annual LDCT-mail box full-unable to leave message.

## 2022-01-30 ENCOUNTER — Other Ambulatory Visit: Payer: Self-pay | Admitting: Family Medicine

## 2022-01-31 NOTE — Telephone Encounter (Signed)
Requested Prescriptions  Pending Prescriptions Disp Refills  . simvastatin (ZOCOR) 40 MG tablet [Pharmacy Med Name: SIMVASTATIN 40 MG Tablet] 90 tablet 1    Sig: TAKE 1 TABLET EVERY DAY     Cardiovascular:  Antilipid - Statins Failed - 01/30/2022  3:19 PM      Failed - Lipid Panel in normal range within the last 12 months    Cholesterol, Total  Date Value Ref Range Status  08/15/2021 145 100 - 199 mg/dL Final   Cholesterol Piccolo, Waived  Date Value Ref Range Status  04/02/2016 142 <200 mg/dL Final    Comment:                            Desirable                <200                         Borderline High      200- 239                         High                     >239    LDL Chol Calc (NIH)  Date Value Ref Range Status  08/15/2021 77 0 - 99 mg/dL Final   HDL  Date Value Ref Range Status  08/15/2021 58 >39 mg/dL Final   Triglycerides  Date Value Ref Range Status  08/15/2021 42 0 - 149 mg/dL Final   Triglycerides Piccolo,Waived  Date Value Ref Range Status  04/02/2016 53 <150 mg/dL Final    Comment:                            Normal                   <150                         Borderline High     150 - 199                         High                200 - 499                         Very High                >499          Passed - Patient is not pregnant      Passed - Valid encounter within last 12 months    Recent Outpatient Visits          5 months ago Routine general medical examination at a health care facility   [facility], [place], DO   11 months ago Benign hypertensive renal disease   [place], Megan P, DO   1 year ago Routine general medical examination at a health care facility   [facility], [place], DO   1 year ago Chronic right shoulder pain   [place], DO   2 years ago Routine general medical examination at a  health care facility   [place], [place], DO      Future Appointments            In 2 weeks Wynetta Emery, Megan P, DO [facility], [facility]           . tamsulosin (FLOMAX) 0.4 MG CAPS capsule [Pharmacy Med Name: TAMSULOSIN HYDROCHLORIDE 0.4 MG Capsule] 90 capsule 1    Sig: TAKE [place]     Urology: Alpha-Adrenergic Blocker Passed - 01/30/2022  3:19 PM      Passed - PSA in normal range and within 360 days    Prostate Specific Ag, Serum  Date Value Ref Range Status  08/15/2021 0.4 0.0 - 4.0 ng/mL Final    Comment:    Roche ECLIA methodology. According to the American Urological Association, Serum PSA should decrease and remain at undetectable levels after radical prostatectomy. The AUA defines biochemical recurrence as an initial PSA value 0.2 ng/mL or greater followed by a subsequent confirmatory PSA value 0.2 ng/mL or greater. Values obtained with different assay methods or kits cannot be used interchangeably. Results cannot be interpreted as absolute evidence of the presence or absence of malignant disease.          Passed - Last BP in normal range    BP Readings from Last 1 Encounters:  08/15/21 133/67         Passed - Valid encounter within last 12 months    Recent Outpatient Visits          5 months ago Routine general medical examination at a health care facility   [facility], [place], DO   11 months ago Benign hypertensive renal disease   [place], Megan P, DO   1 year ago Routine general medical examination at a health care facility   [facility], [place], DO   1 year ago Chronic right shoulder pain   [facility] Valerie Roys, DO   2 years ago Routine general medical examination at a health care facility   [place], Barb Merino, DO      Future Appointments            In 2 weeks Wynetta Emery, Barb Merino, DO [facility], [place]

## 2022-02-15 ENCOUNTER — Encounter: Payer: Self-pay | Admitting: Family Medicine

## 2022-02-15 ENCOUNTER — Ambulatory Visit (INDEPENDENT_AMBULATORY_CARE_PROVIDER_SITE_OTHER): Payer: Medicare HMO | Admitting: Family Medicine

## 2022-02-15 VITALS — BP 138/77 | HR 52 | Temp 98.7°F | Ht 65.0 in | Wt 208.7 lb

## 2022-02-15 DIAGNOSIS — N401 Enlarged prostate with lower urinary tract symptoms: Secondary | ICD-10-CM | POA: Diagnosis not present

## 2022-02-15 DIAGNOSIS — I129 Hypertensive chronic kidney disease with stage 1 through stage 4 chronic kidney disease, or unspecified chronic kidney disease: Secondary | ICD-10-CM | POA: Diagnosis not present

## 2022-02-15 DIAGNOSIS — R3911 Hesitancy of micturition: Secondary | ICD-10-CM

## 2022-02-15 DIAGNOSIS — E782 Mixed hyperlipidemia: Secondary | ICD-10-CM

## 2022-02-15 DIAGNOSIS — Z23 Encounter for immunization: Secondary | ICD-10-CM

## 2022-02-15 MED ORDER — METOPROLOL TARTRATE 25 MG PO TABS: 25.0000 mg | ORAL_TABLET | Freq: Two times a day (BID) | ORAL | 1 refills | Status: DC

## 2022-02-15 MED ORDER — ALBUTEROL SULFATE HFA 108 (90 BASE) MCG/ACT IN AERS: INHALATION_SPRAY | RESPIRATORY_TRACT | 6 refills | Status: DC

## 2022-02-15 MED ORDER — LISINOPRIL 40 MG PO TABS: 40.0000 mg | ORAL_TABLET | Freq: Every day | ORAL | 1 refills | Status: DC

## 2022-02-15 NOTE — Assessment & Plan Note (Signed)
Under good control on current regimen. Continue current regimen. Continue to monitor. Call with any concerns. Refills given. Labs drawn today.   

## 2022-02-15 NOTE — Progress Notes (Signed)
BP 138/77   Pulse (!) 52   Temp 98.7 F (37.1 C) (Oral)   Ht _0  (1.651 m)   Wt 208 lb 11.2 oz (94.7 kg)   SpO2 97%   BMI 34.73 kg/m    Subjective:    Patient ID: Mike Desanctis., male    DOB: [birth year], 66 y.o.   MRN: [id number]  HPI: Mike Certain Bigler [name] is a 66 y.o. male  Chief Complaint  Patient presents with   Benign hypertensive renal disease   HYPERTENSION / [place] Satisfied with current treatment? yes Duration of hypertension: chronic BP monitoring frequency: rarely BP medication side effects: no Past BP meds: lisinopril, metoprolol Duration of hyperlipidemia: chronic Cholesterol medication side effects: no Cholesterol supplements: none Past cholesterol medications: simvastatin Medication compliance: excellent compliance Aspirin: yes Recent stressors: no Recurrent headaches: no Visual changes: no Palpitations: no Dyspnea: no Chest pain: no Lower extremity edema: no Dizzy/lightheaded: no  BPH BPH status: controlled Satisfied with current treatment?: yes Medication side effects: no Medication compliance: excellent compliance Duration: chronic Nocturia: 1/night Urinary frequency:no Incomplete voiding: no Urgency: no Weak urinary stream: no Straining to start stream: no Dysuria: no Onset: gradual Severity: mild  Relevant past medical, surgical, family and social history reviewed and updated as indicated. Interim medical history since our last visit reviewed. Allergies and medications reviewed and updated.  Review of Systems  Constitutional: Negative.   Respiratory: Negative.    Cardiovascular: Negative.   Gastrointestinal: Negative.   Musculoskeletal: Negative.   Neurological: Negative.   Psychiatric/Behavioral: Negative.      Per HPI unless specifically indicated above     Objective:    BP 138/77   Pulse (!) 52   Temp 98.7 F (37.1 C) (Oral)   Ht _1  (1.651 m)   Wt 208 lb 11.2 oz (94.7 kg)   SpO2 97%    BMI 34.73 kg/m   Wt Readings from Last 3 Encounters:  02/15/22 208 lb 11.2 oz (94.7 kg)  08/15/21 205 lb 6.4 oz (93.2 kg)  02/14/21 201 lb (91.2 kg)    Physical Exam Vitals and nursing note reviewed.  Constitutional:      General: He is not in acute distress.    Appearance: Normal appearance. He is not ill-appearing, toxic-appearing or diaphoretic.  HENT:     Head: Normocephalic and atraumatic.     Right Ear: External ear normal.     Left Ear: External ear normal.     Nose: Nose normal.     Mouth/Throat:     Mouth: Mucous membranes are moist.     Pharynx: Oropharynx is clear.  Eyes:     General: No scleral icterus.       Right eye: No discharge.        Left eye: No discharge.     Extraocular Movements: Extraocular movements intact.     Conjunctiva/sclera: Conjunctivae normal.     Pupils: Pupils are equal, round, and reactive to light.  Cardiovascular:     Rate and Rhythm: Normal rate and regular rhythm.     Pulses: Normal pulses.     Heart sounds: Normal heart sounds. No murmur heard.    No friction rub. No gallop.  Pulmonary:     Effort: Pulmonary effort is normal. No respiratory distress.     Breath sounds: Normal breath sounds. No stridor. No wheezing, rhonchi or rales.  Chest:     Chest wall: No tenderness.  Musculoskeletal:  General: Normal range of motion.     Cervical back: Normal range of motion and neck supple.  Skin:    General: Skin is warm and dry.     Capillary Refill: Capillary refill takes less than 2 seconds.     Coloration: Skin is not jaundiced or pale.     Findings: No bruising, erythema, lesion or rash.  Neurological:     General: No focal deficit present.     Mental Status: He is alert and oriented to person, place, and time. Mental status is at baseline.  Psychiatric:        Mood and Affect: Mood normal.        Behavior: Behavior normal.        Thought Content: Thought content normal.        Judgment: Judgment normal.     Results  for orders placed or performed in visit on 08/15/21  Comprehensive metabolic panel  Result Value Ref Range   Glucose 109 (H) 70 - 99 mg/dL   BUN 18 8 - 27 mg/dL   Creatinine, Ser 1.50 (H) 0.76 - 1.27 mg/dL   eGFR 51 (L) >59 mL/min/1.73   BUN/Creatinine Ratio 12 10 - 24   Sodium 140 134 - 144 mmol/L   Potassium 4.4 3.5 - 5.2 mmol/L   Chloride 103 96 - 106 mmol/L   CO2 26 20 - 29 mmol/L   Calcium 9.3 8.6 - 10.2 mg/dL   Total Protein 6.5 6.0 - 8.5 g/dL   Albumin 4.1 3.8 - 4.8 g/dL   Globulin, Total 2.4 1.5 - 4.5 g/dL   Albumin/Globulin Ratio 1.7 1.2 - 2.2   Bilirubin Total 0.2 0.0 - 1.2 mg/dL   Alkaline Phosphatase 137 (H) 44 - 121 IU/L   AST 38 0 - 40 IU/L   ALT 19 0 - 44 IU/L  CBC with Differential/Platelet  Result Value Ref Range   WBC 6.0 3.4 - 10.8 x10E3/uL   RBC 5.35 4.14 - 5.80 x10E6/uL   Hemoglobin 13.6 13.0 - 17.7 g/dL   Hematocrit 42.3 37.5 - 51.0 %   MCV 79 79 - 97 fL   MCH 25.4 (L) 26.6 - 33.0 pg   MCHC 32.2 31.5 - 35.7 g/dL   RDW 14.0 11.6 - 15.4 %   Platelets 188 150 - 450 x10E3/uL   Neutrophils 41 Not Estab. %   Lymphs 40 Not Estab. %   Monocytes 10 Not Estab. %   Eos 7 Not Estab. %   Basos 1 Not Estab. %   Neutrophils Absolute 2.4 1.4 - 7.0 x10E3/uL   Lymphocytes Absolute 2.4 0.7 - 3.1 x10E3/uL   Monocytes Absolute 0.6 0.1 - 0.9 x10E3/uL   EOS (ABSOLUTE) 0.4 0.0 - 0.4 x10E3/uL   Basophils Absolute 0.1 0.0 - 0.2 x10E3/uL   Immature Granulocytes 1 Not Estab. %   Immature Grans (Abs) 0.0 0.0 - 0.1 x10E3/uL  Lipid Panel w/o Chol/HDL Ratio  Result Value Ref Range   Cholesterol, Total 145 100 - 199 mg/dL   Triglycerides 42 0 - 149 mg/dL   HDL 58 >39 mg/dL   VLDL Cholesterol Cal 10 5 - 40 mg/dL   LDL Chol Calc (NIH) 77 0 - 99 mg/dL  PSA  Result Value Ref Range   Prostate Specific Ag, Serum 0.4 0.0 - 4.0 ng/mL  TSH  Result Value Ref Range   TSH 1.450 0.450 - 4.500 uIU/mL  Urinalysis, Routine w reflex microscopic  Result Value Ref Range   Specific  Gravity, UA 1.025 1.005 - 1.030   pH, UA 5.5 5.0 - 7.5   Color, UA Yellow Yellow   Appearance Ur Clear Clear   Leukocytes,UA Negative Negative   Protein,UA Negative Negative/Trace   Glucose, UA Negative Negative   Ketones, UA Negative Negative   RBC, UA Negative Negative   Bilirubin, UA Negative Negative   Urobilinogen, Ur 0.2 0.2 - 1.0 mg/dL   Nitrite, UA Negative Negative  Microalbumin, Urine Waived  Result Value Ref Range   Microalb, Ur Waived 30 (H) 0 - 19 mg/L   Creatinine, Urine Waived 300 10 - 300 mg/dL   Microalb/Creat Ratio <30 <30 mg/g      Assessment & Plan:   Problem List Items Addressed This Visit       Genitourinary   Benign hypertensive renal disease - Primary    Under good control on current regimen. Continue current regimen. Continue to monitor. Call with any concerns. Refills given. Labs drawn today.       Relevant Orders   Comprehensive metabolic panel   CBC with Differential/Platelet     Other   HLD (hyperlipidemia)    Under good control on current regimen. Continue current regimen. Continue to monitor. Call with any concerns. Refills given. Labs drawn today.       Relevant Medications   metoprolol tartrate (LOPRESSOR) 25 MG tablet   lisinopril (ZESTRIL) 40 MG tablet   Other Relevant Orders   Comprehensive metabolic panel   CBC with Differential/Platelet   Lipid Panel Piccolo, Waived   Urinary hesitancy due to benign prostatic hyperplasia    Under good control on current regimen. Continue current regimen. Continue to monitor. Call with any concerns. Refills given. Labs drawn today.       Relevant Orders   Comprehensive metabolic panel   CBC with Differential/Platelet   PSA   Other Visit Diagnoses     Need for influenza vaccination       Relevant Orders   Flu Vaccine QUAD 9moIM (Fluarix, Fluzone & Alfiuria Quad PF) (Completed)        Follow up plan: Return in about 6 months (around 08/16/2022) for Physical.

## 2022-02-16 ENCOUNTER — Encounter: Payer: Self-pay | Admitting: Family Medicine

## 2022-02-16 LAB — COMPREHENSIVE METABOLIC PANEL
ALT: 27 IU/L (ref 0–44)
AST: 39 IU/L (ref 0–40)
Albumin/Globulin Ratio: 1.7 (ref 1.2–2.2)
Albumin: 4.3 g/dL (ref 3.9–4.9)
Alkaline Phosphatase: 137 IU/L — ABNORMAL HIGH (ref 44–121)
BUN/Creatinine Ratio: 15 (ref 10–24)
BUN: 18 mg/dL (ref 8–27)
Bilirubin Total: 0.4 mg/dL (ref 0.0–1.2)
CO2: 21 mmol/L (ref 20–29)
Calcium: 9.4 mg/dL (ref 8.6–10.2)
Chloride: 100 mmol/L (ref 96–106)
Creatinine, Ser: 1.24 mg/dL (ref 0.76–1.27)
Globulin, Total: 2.5 g/dL (ref 1.5–4.5)
Glucose: 102 mg/dL — ABNORMAL HIGH (ref 70–99)
Potassium: 4.2 mmol/L (ref 3.5–5.2)
Sodium: 136 mmol/L (ref 134–144)
Total Protein: 6.8 g/dL (ref 6.0–8.5)
eGFR: 64 mL/min/{1.73_m2} (ref >59)

## 2022-02-16 LAB — CBC WITH DIFFERENTIAL/PLATELET
Basophils Absolute: 0.1 10*3/uL (ref 0.0–0.2)
Basos: 1 %
EOS (ABSOLUTE): 0.4 10*3/uL (ref 0.0–0.4)
Eos: 7 %
Hematocrit: 42.7 % (ref 37.5–51.0)
Hemoglobin: 14.3 g/dL (ref 13.0–17.7)
Immature Grans (Abs): 0 10*3/uL (ref 0.0–0.1)
Immature Granulocytes: 1 %
Lymphocytes Absolute: 2.3 10*3/uL (ref 0.7–3.1)
Lymphs: 42 %
MCH: 25.7 pg — ABNORMAL LOW (ref 26.6–33.0)
MCHC: 33.5 g/dL (ref 31.5–35.7)
MCV: 77 fL — ABNORMAL LOW (ref 79–97)
Monocytes Absolute: 0.6 10*3/uL (ref 0.1–0.9)
Monocytes: 11 %
Neutrophils Absolute: 2.1 10*3/uL (ref 1.4–7.0)
Neutrophils: 38 %
Platelets: 176 10*3/uL (ref 150–450)
RBC: 5.57 x10E6/uL (ref 4.14–5.80)
RDW: 12.6 % (ref 11.6–15.4)
WBC: 5.4 10*3/uL (ref 3.4–10.8)

## 2022-02-16 LAB — PSA: Prostate Specific Ag, Serum: 0.5 ng/mL (ref 0.0–4.0)

## 2022-02-18 NOTE — Addendum Note (Signed)
Addended by: Valerie Roys on: 02/18/2022 08:36 AM   Modules accepted: Orders

## 2022-02-19 LAB — LIPID PANEL W/O CHOL/HDL RATIO
Cholesterol, Total: 182 mg/dL (ref 100–199)
HDL: 71 mg/dL (ref >39)
LDL Chol Calc (NIH): 93 mg/dL (ref 0–99)
Triglycerides: 101 mg/dL (ref 0–149)
VLDL Cholesterol Cal: 18 mg/dL (ref 5–40)

## 2022-02-19 LAB — SPECIMEN STATUS REPORT

## 2022-07-31 ENCOUNTER — Telehealth: Payer: Self-pay | Admitting: Family Medicine

## 2022-07-31 NOTE — Telephone Encounter (Signed)
[facility] [employer]. to schedule their annual wellness visit. Appointment made for 09/02/2022.  Sherol Dade; Care Guide Amb[facility] Direct Dial: [phone]

## 2022-07-31 NOTE — Telephone Encounter (Signed)
Copied from [place] [id number]. Topic: Medicare AWV >> Jul 31, 2022 11:16 AM Devoria Glassing wrote: Reason for CRM: Called patient to schedule Medicare Annual Wellness Visit (AWV). No voicemail available to leave a message.  Last date of AWV: 08/27/21  Please schedule an appointment at any time with Kirke Shaggy, LPN .  If any questions, please contact me.  Thank you ,  Sherol Dade; [place] Direct Dial: [phone]

## 2022-08-16 ENCOUNTER — Encounter: Payer: Medicare HMO | Admitting: Family Medicine

## 2022-09-02 ENCOUNTER — Other Ambulatory Visit: Payer: Self-pay | Admitting: Family Medicine

## 2022-09-02 ENCOUNTER — Ambulatory Visit (INDEPENDENT_AMBULATORY_CARE_PROVIDER_SITE_OTHER): Payer: Medicare HMO

## 2022-09-02 VITALS — Ht 65.0 in | Wt 204.6 lb

## 2022-09-02 DIAGNOSIS — Z0000 Encounter for general adult medical examination without abnormal findings: Secondary | ICD-10-CM | POA: Diagnosis not present

## 2022-09-02 NOTE — Patient Instructions (Signed)
Mike [name] , Thank you for taking time to come for your Medicare Wellness Visit. I appreciate your ongoing commitment to your health goals. Please review the following plan we discussed and let me know if I can assist you in the future.   These are the goals we discussed:  Goals      DIET - EAT MORE FRUITS AND VEGETABLES     DIET - INCREASE WATER INTAKE     Recommend continue drinking at least 6-8 glasses of water a day      Patient Stated     08/25/2020, stay healthy     Patient Stated     Stay healthy        This is a list of the screening recommended for you and due dates:  Health Maintenance  Topic Date Due   Zoster (Shingles) Vaccine (1 of 2) Never done   Screening for Lung Cancer  10/10/2021   COVID-19 Vaccine (3 - 2023-24 season) 01/18/2022   Flu Shot  12/19/2022   Medicare Annual Wellness Visit  09/02/2023   Colon Cancer Screening  09/30/2024   DTaP/Tdap/Td vaccine (3 - Td or Tdap) 08/15/2030   Pneumonia Vaccine  Completed   Hepatitis C Screening: USPSTF Recommendation to screen - Ages 103-79 yo.  Completed   HPV Vaccine  Aged Out    Advanced directives: no  Conditions/risks identified: none  Next appointment: Follow up in one year for your annual wellness visit. 09/07/13 @ 10:15 am in person  Preventive Care 65 Years and Older, Male  Preventive care refers to lifestyle choices and visits with your health care provider that can promote health and wellness. What does preventive care include? A yearly physical exam. This is also called an annual well check. Dental exams once or twice a year. Routine eye exams. Ask your health care provider how often you should have your eyes checked. Personal lifestyle choices, including: Daily care of your teeth and gums. Regular physical activity. Eating a healthy diet. Avoiding tobacco and drug use. Limiting alcohol use. Practicing safe sex. Taking low doses of aspirin every day. Taking vitamin and mineral supplements as  recommended by your health care provider. What happens during an annual well check? The services and screenings done by your health care provider during your annual well check will depend on your age, overall health, lifestyle risk factors, and family history of disease. Counseling  Your health care provider may ask you questions about your: Alcohol use. Tobacco use. Drug use. Emotional well-being. Home and relationship well-being. Sexual activity. Eating habits. History of falls. Memory and ability to understand (cognition). Work and work Astronomer. Screening  You may have the following tests or measurements: Height, weight, and BMI. Blood pressure. Lipid and cholesterol levels. These may be checked every 5 years, or more frequently if you are over 27 years old. Skin check. Lung cancer screening. You may have this screening every year starting at age 68 if you have a 30-pack-year history of smoking and currently smoke or have quit within the past 15 years. Fecal occult blood test (FOBT) of the stool. You may have this test every year starting at age 48. Flexible sigmoidoscopy or colonoscopy. You may have a sigmoidoscopy every 5 years or a colonoscopy every 10 years starting at age 76. Prostate cancer screening. Recommendations will vary depending on your family history and other risks. Hepatitis C blood test. Hepatitis B blood test. Sexually transmitted disease (STD) testing. Diabetes screening. This is done by checking your  blood sugar (glucose) after you have not eaten for a while (fasting). You may have this done every 1-3 years. Abdominal aortic aneurysm (AAA) screening. You may need this if you are a current or former smoker. Osteoporosis. You may be screened starting at age 31 if you are at high risk. Talk with your health care provider about your test results, treatment options, and if necessary, the need for more tests. Vaccines  Your health care provider may recommend  certain vaccines, such as: Influenza vaccine. This is recommended every year. Tetanus, diphtheria, and acellular pertussis (Tdap, Td) vaccine. You may need a Td booster every 10 years. Zoster vaccine. You may need this after age 32. Pneumococcal 13-valent conjugate (PCV13) vaccine. One dose is recommended after age 36. Pneumococcal polysaccharide (PPSV23) vaccine. One dose is recommended after age 71. Talk to your health care provider about which screenings and vaccines you need and how often you need them. This information is not intended to replace advice given to you by your health care provider. Make sure you discuss any questions you have with your health care provider. Document Released: 06/02/2015 Document Revised: 01/24/2016 Document Reviewed: 03/07/2015 Elsevier Interactive Patient Education  2017 [place] Prevention in the Home Falls can cause injuries. They can happen to people of all ages. There are many things you can do to make your home safe and to help prevent falls. What can I do on the outside of my home? Regularly fix the edges of walkways and driveways and fix any cracks. Remove anything that might make you trip as you walk through a door, such as a raised step or threshold. Trim any bushes or trees on the path to your home. Use bright outdoor lighting. Clear any walking paths of anything that might make someone trip, such as rocks or tools. Regularly check to see if handrails are loose or broken. Make sure that both sides of any steps have handrails. Any raised decks and porches should have guardrails on the edges. Have any leaves, snow, or ice cleared regularly. Use sand or salt on walking paths during winter. Clean up any spills in your garage right away. This includes oil or grease spills. What can I do in the bathroom? Use night lights. Install grab bars by the toilet and in the tub and shower. Do not use towel bars as grab bars. Use non-skid mats or  decals in the tub or shower. If you need to sit down in the shower, use a plastic, non-slip stool. Keep the floor dry. Clean up any water that spills on the floor as soon as it happens. Remove soap buildup in the tub or shower regularly. Attach bath mats securely with double-sided non-slip rug tape. Do not have throw rugs and other things on the floor that can make you trip. What can I do in the bedroom? Use night lights. Make sure that you have a light by your bed that is easy to reach. Do not use any sheets or blankets that are too big for your bed. They should not hang down onto the floor. Have a firm chair that has side arms. You can use this for support while you get dressed. Do not have throw rugs and other things on the floor that can make you trip. What can I do in the kitchen? Clean up any spills right away. Avoid walking on wet floors. Keep items that you use a lot in easy-to-reach places. If you need to reach something above  you, use a strong step stool that has a grab bar. Keep electrical cords out of the way. Do not use floor polish or wax that makes floors slippery. If you must use wax, use non-skid floor wax. Do not have throw rugs and other things on the floor that can make you trip. What can I do with my stairs? Do not leave any items on the stairs. Make sure that there are handrails on both sides of the stairs and use them. Fix handrails that are broken or loose. Make sure that handrails are as long as the stairways. Check any carpeting to make sure that it is firmly attached to the stairs. Fix any carpet that is loose or worn. Avoid having throw rugs at the top or bottom of the stairs. If you do have throw rugs, attach them to the floor with carpet tape. Make sure that you have a light switch at the top of the stairs and the bottom of the stairs. If you do not have them, ask someone to add them for you. What else can I do to help prevent falls? Wear shoes that: Do not  have high heels. Have rubber bottoms. Are comfortable and fit you well. Are closed at the toe. Do not wear sandals. If you use a stepladder: Make sure that it is fully opened. Do not climb a closed stepladder. Make sure that both sides of the stepladder are locked into place. Ask someone to hold it for you, if possible. Clearly mark and make sure that you can see: Any grab bars or handrails. First and last steps. Where the edge of each step is. Use tools that help you move around (mobility aids) if they are needed. These include: Canes. Walkers. Scooters. Crutches. Turn on the lights when you go into a dark area. Replace any light bulbs as soon as they burn out. Set up your furniture so you have a clear path. Avoid moving your furniture around. If any of your floors are uneven, fix them. If there are any pets around you, be aware of where they are. Review your medicines with your doctor. Some medicines can make you feel dizzy. This can increase your chance of falling. Ask your doctor what other things that you can do to help prevent falls. This information is not intended to replace advice given to you by your health care provider. Make sure you discuss any questions you have with your health care provider. Document Released: 03/02/2009 Document Revised: 10/12/2015 Document Reviewed: 06/10/2014 Elsevier Interactive Patient Education  2017 [employer].

## 2022-09-02 NOTE — Progress Notes (Signed)
Subjective:   Mike [name]. is a 67 y.o. male who presents for Medicare Annual/Subsequent preventive examination.  Review of Systems     Cardiac Risk Factors include: advanced age (>37men, >58 women);hypertension;male gender     Objective:    Today's Vitals   09/02/22 1125 09/02/22 1127  Weight: 204 lb 9.6 oz (92.8 kg)   Height:  (1.651 m)   PainSc:  4    Body mass index is 34.05 kg/m.     09/02/2022   11:32 AM 08/27/2021    9:04 AM 08/25/2020    9:02 AM 10/01/2019   10:28 AM 02/25/2018    9:52 AM 02/21/2017   10:49 AM 04/02/2016   10:50 AM  Advanced Directives  Does Patient Have a Medical Advance Directive? No No No No No No No  Would patient like information on creating a medical advance directive? No - Patient declined No - Patient declined   Yes (MAU/Ambulatory/Procedural Areas - Information given) No - Patient declined Yes - Educational materials given    Current Medications (verified) Outpatient Encounter Medications as of 09/02/2022  Medication Sig   albuterol (VENTOLIN HFA) 108 (90 Base) MCG/ACT inhaler INHALE 2 PUFFS INTO THE LUNGS EVERY 6  HOURS AS NEEDED FOR WHEEZING OR SHORTNESS OF BREATH.   aspirin EC 81 MG tablet Take 81 mg by mouth daily.    EPINEPHrine 0.3 mg/0.3 mL IJ SOAJ injection Inject 0.3 mg into the muscle as needed for anaphylaxis.   lisinopril (ZESTRIL) 40 MG tablet Take 1 tablet (40 mg total) by mouth daily.   metoprolol tartrate (LOPRESSOR) 25 MG tablet Take 1 tablet (25 mg total) by mouth 2 (two) times daily.   simvastatin (ZOCOR) 40 MG tablet TAKE 1 TABLET EVERY DAY   tadalafil (CIALIS) 20 MG tablet Take 0.5-1 tablets (10-20 mg total) by mouth every other day as needed for erectile dysfunction.   tamsulosin (FLOMAX) 0.4 MG CAPS capsule TAKE 1 CAPSULE EVERY DAY   No facility-administered encounter medications on file as of 09/02/2022.    Allergies (verified) Shellfish allergy, Peanuts [peanut oil], and Tuna [fish allergy]    History: Past Medical History:  Diagnosis Date   Arthritis    Benign enlargement of prostate    CAD (coronary artery disease)    Chronic kidney disease    History of seizures as a child    Due to head injury   Hyperlipidemia    Hypertension    Lumbago    Myocardial infarction    Urinary hesitancy    Past Surgical History:  Procedure Laterality Date   COLONOSCOPY WITH PROPOFOL N/A 10/01/2019   Procedure: COLONOSCOPY WITH PROPOFOL;  Surgeon: Toney Reil, MD;  Location: [facility] ENDOSCOPY;  Service: Gastroenterology;  Laterality: N/A;   coronary artery stent     Family History  Problem Relation Age of Onset   Hypertension Mother    Breast cancer Mother    Hypertension Father    Stroke Father    Stroke Maternal Grandfather    Social History   Socioeconomic History   Marital status: Divorced    Spouse name: Not on file   Number of children: Not on file   Years of education: Not on file   Highest education level: Some college, no degree  Occupational History   Occupation: retired  Tobacco Use   Smoking status: Former    Packs/day: 1.00    Years: 30.00    Additional pack years: 0.00    Total pack  years: 30.00    Types: Cigarettes    Quit date: 04/12/2016    Years since quitting: 6.3   Smokeless tobacco: Never   Tobacco comments:    since 15, max 1 ppd x10  Vaping Use   Vaping Use: Never used  Substance and Sexual Activity   Alcohol use: No    Alcohol/week: 0.0 standard drinks of alcohol   Drug use: No   Sexual activity: Yes    Birth control/protection: None  Other Topics Concern   Not on file  Social History Narrative   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    Social Determinants of Health   Financial Resource Strain: Low Risk  (09/02/2022)   Overall Financial Resource Strain (CARDIA)    Difficulty of Paying Living Expenses: Not hard at all  Food Insecurity: No Food Insecurity (09/02/2022)   Hunger Vital Sign    Worried About Running Out  of Food in the Last Year: Never true    Ran Out of Food in the Last Year: Never true  Transportation Needs: No Transportation Needs (09/02/2022)   PRAPARE - Administrator, Civil Service (Medical): No    Lack of Transportation (Non-Medical): No  Physical Activity: Sufficiently Active (09/02/2022)   Exercise Vital Sign    Days of Exercise per Week: 7 days    Minutes of Exercise per Session: 60 min  Stress: No Stress Concern Present (09/02/2022)   [name] of Occupational Health - Occupational Stress Questionnaire    Feeling of Stress : Not at all  Social Connections: Moderately Isolated (09/02/2022)   Social Connection and Isolation Panel [NHANES]    Frequency of Communication with Friends and Family: Once a week    Frequency of Social Gatherings with Friends and Family: More than three times a week    Attends Religious Services: Never    Database administrator or Organizations: Yes    Attends Engineer, structural: More than 4 times per year    Marital Status: Divorced    Tobacco Counseling Counseling given: Not Answered Tobacco comments: since 15, max 1 ppd x10   Clinical Intake:  Pre-visit preparation completed: Yes  Pain : 0-10 Pain Score: 4  Pain Type: Chronic pain Pain Location: Hand Pain Orientation: Right     Nutritional Risks: None Diabetes: No  How often do you need to have someone help you when you read instructions, pamphlets, or other written materials from your doctor or pharmacy?: 1 - Never  Diabetic?no  Interpreter Needed?: No  Information entered by :: Kennedy Bucker, LPN   Activities of Daily Living    09/02/2022   11:33 AM  In your present state of health, do you have any difficulty performing the following activities:  Hearing? 0  Vision? 0  Difficulty concentrating or making decisions? 0  Walking or climbing stairs? 1  Comment sciatica right side  Dressing or bathing? 0  Doing errands, shopping? 0  Preparing  Food and eating ? N  Using the Toilet? N  In the past six months, have you accidently leaked urine? N  Do you have problems with loss of bowel control? N  Managing your Medications? N  Managing your Finances? N  Housekeeping or managing your Housekeeping? N    Patient Care Team: Dorcas Carrow, DO as PCP - General (Family Medicine)  Indicate any recent Medical Services you may have received from other than [facility] providers in the past year (  date may be approximate).     Assessment:   This is a routine wellness examination for Mike [name].  Hearing/Vision screen Hearing Screening - Comments:: No aids Vision Screening - Comments:: Readers-   Dietary issues and exercise activities discussed: Current Exercise Habits: Home exercise routine, Type of exercise: Other - see comments (gym), Time (Minutes): 60, Frequency (Times/Week): 7, Weekly Exercise (Minutes/Week): 420, Intensity: Moderate   Goals Addressed             This Visit's Progress    DIET - EAT MORE FRUITS AND VEGETABLES         Depression Screen    09/02/2022   11:30 AM 02/15/2022    2:22 PM 08/27/2021    9:07 AM 08/15/2021    9:06 AM 08/25/2020    9:03 AM 08/14/2020    9:09 AM 08/12/2019    9:25 AM  PHQ 2/9 Scores  PHQ - 2 Score 0 0 0 0 0 0 0  PHQ- 9 Score 0 0 0 0       Fall Risk    09/02/2022   11:33 AM 02/15/2022    2:22 PM 08/27/2021    9:05 AM 08/15/2021    9:06 AM 08/15/2021    9:01 AM  Fall Risk   Falls in the past year? 1 0 1 0 0  Number falls in past yr: 0 0 0 0 0  Injury with Fall? 0 0 0 0 0  Risk for fall due to : History of fall(s);Other (Comment) No Fall Risks  No Fall Risks No Fall Risks  Risk for fall due to: Comment tripped      Follow up Falls prevention discussed;Falls evaluation completed Falls evaluation completed Falls evaluation completed;Education provided;Falls prevention discussed Falls evaluation completed Falls evaluation completed    FALL RISK PREVENTION PERTAINING TO THE HOME:  Any  stairs in or around the home? No  If so, are there any without handrails? No  Home free of loose throw rugs in walkways, pet beds, electrical cords, etc? Yes  Adequate lighting in your home to reduce risk of falls? Yes   ASSISTIVE DEVICES UTILIZED TO PREVENT FALLS:  Life alert? No  Use of a cane, walker or w/c? No  Grab bars in the bathroom? Yes  Shower chair or bench in shower? No  Elevated toilet seat or a handicapped toilet? No   TIMED UP AND GO:  Was the test performed? Yes .  Length of time to ambulate 10 feet: 4 sec.   Gait steady and fast without use of assistive device  Cognitive Function:        08/27/2021    9:10 AM 08/25/2020    9:06 AM 02/25/2018    9:54 AM 02/21/2017   10:52 AM 04/02/2016   10:37 AM  6CIT Screen  What Year? 0 points 0 points 0 points 0 points 0 points  What month? 0 points 0 points 0 points 0 points 0 points  What time? 0 points 0 points 0 points 0 points 0 points  Count back from 20 0 points 0 points 0 points 0 points 0 points  Months in reverse 0 points 0 points 0 points 0 points 0 points  Repeat phrase 2 points 2 points 2 points 2 points 2 points  Total Score 2 points 2 points 2 points 2 points 2 points    Immunizations Immunization History  Administered Date(s) Administered   Fluad Quad(high Dose 65+) 02/14/2020   Influenza,inj,Quad PF,6+ Mos 03/20/2015, 04/02/2016, 02/21/2017,  02/25/2018, 02/02/2019, 02/14/2021, 02/15/2022   Moderna Sars-Covid-2 Vaccination 09/11/2019, 10/11/2019   Pneumococcal Conjugate-13 02/16/2014   Pneumococcal Polysaccharide-23 02/14/2020   Td 08/14/2020   Tdap 05/20/2010   Zoster, Live 07/19/2014    TDAP status: Up to date  Flu Vaccine status: Up to date  Pneumococcal vaccine status: Up to date  Covid-19 vaccine status: Completed vaccines  Qualifies for Shingles Vaccine? Yes   Zostavax completed Yes   Shingrix Completed?: No.    Education has been provided regarding the importance of this vaccine.  Patient has been advised to call insurance company to determine out of pocket expense if they have not yet received this vaccine. Advised may also receive vaccine at local pharmacy or Health Dept. Verbalized acceptance and understanding.  Screening Tests Health Maintenance  Topic Date Due   Zoster Vaccines- Shingrix (1 of 2) Never done   Lung Cancer Screening  10/10/2021   COVID-19 Vaccine (3 - 2023-24 season) 01/18/2022   INFLUENZA VACCINE  12/19/2022   Medicare Annual Wellness (AWV)  09/02/2023   COLONOSCOPY (Pts 45-57yrs Insurance coverage will need to be confirmed)  09/30/2024   DTaP/Tdap/Td (3 - Td or Tdap) 08/15/2030   Pneumonia Vaccine 6+ Years old  Completed   Hepatitis C Screening  Completed   HPV VACCINES  Aged Out    Health Maintenance  Health Maintenance Due  Topic Date Due   Zoster Vaccines- Shingrix (1 of 2) Never done   Lung Cancer Screening  10/10/2021   COVID-19 Vaccine (3 - 2023-24 season) 01/18/2022    Colorectal cancer screening: Type of screening: Colonoscopy. Completed 10/01/19. Repeat every 10 years  Lung Cancer Screening: (Low Dose CT Chest recommended if Age 6-80 years, 30 pack-year currently smoking OR have quit w/in 15years.) does not qualify.   Additional Screening:  Hepatitis C Screening: does qualify; Completed 03/20/15  Vision Screening: Recommended annual ophthalmology exams for early detection of glaucoma and other disorders of the eye. Is the patient up to date with their annual eye exam?  No  Who is the provider or what is the name of the office in which the patient attends annual eye exams? No one If pt is not established with a provider, would they like to be referred to a provider to establish care? No .   Dental Screening: Recommended annual dental exams for proper oral hygiene  Community Resource Referral / Chronic Care Management: CRR required this visit?  No   CCM required this visit?  No      Plan:     I have personally  reviewed and noted the following in the patient's chart:   Medical and social history Use of alcohol, tobacco or illicit drugs  Current medications and supplements including opioid prescriptions. Patient is not currently taking opioid prescriptions. Functional ability and status Nutritional status Physical activity Advanced directives List of other physicians Hospitalizations, surgeries, and ER visits in previous 12 months Vitals Screenings to include cognitive, depression, and falls Referrals and appointments  In addition, I have reviewed and discussed with patient certain preventive protocols, quality metrics, and best practice recommendations. A written personalized care plan for preventive services as well as general preventive health recommendations were provided to patient.     Hal Hope, LPN   [id number]/61/[id number]   Nurse Notes: none

## 2022-09-03 NOTE — Telephone Encounter (Signed)
Requested Prescriptions  Pending Prescriptions Disp Refills   metoprolol tartrate (LOPRESSOR) 25 MG tablet [Pharmacy Med Name: METOPROLOL TARTRATE 25 MG Tablet] 180 tablet 0    Sig: TAKE 1 TABLET TWICE DAILY     Cardiovascular:  Beta Blockers Failed - 09/02/2022  1:55 AM      Failed - Valid encounter within last 6 months    Recent Outpatient Visits           6 months ago Benign hypertensive renal disease   [facility] [place], Megan P, DO   1 year ago Routine general medical examination at a health care facility   [facility] [place], [place] P, DO   1 year ago Benign hypertensive renal disease   [facility] [place], Megan P, DO   2 years ago Routine general medical examination at a health care facility   [facility] [place], [place] P, DO   2 years ago Chronic right shoulder pain   [facility] [place], Oralia Rud, DO       Future Appointments             In 2 days Laural Benes, Oralia Rud, DO [facility]            Passed - Last BP in normal range    BP Readings from Last 1 Encounters:  02/15/22 138/77         Passed - Last Heart Rate in normal range    Pulse Readings from Last 1 Encounters:  02/15/22 (!) 52          lisinopril (ZESTRIL) 40 MG tablet [Pharmacy Med Name: LISINOPRIL 40 MG Tablet] 90 tablet 0    Sig: TAKE 1 TABLET EVERY DAY     Cardiovascular:  ACE Inhibitors Failed - 09/02/2022  1:55 AM      Failed - Cr in normal range and within 180 days    Creatinine, Ser  Date Value Ref Range Status  02/15/2022 1.24 0.76 - 1.27 mg/dL Final         Failed - K in normal range and within 180 days    Potassium  Date Value Ref Range Status  02/15/2022 4.2 3.5 - 5.2 mmol/L Final         Failed - Valid encounter within last 6 months    Recent Outpatient Visits           6 months ago Benign hypertensive renal disease    [facility] [place], Oralia Rud, DO   1 year ago Routine general medical examination at a health care facility   [facility] [place], [place] P, DO   1 year ago Benign hypertensive renal disease   [facility] [place], [place] P, DO   2 years ago Routine general medical examination at a health care facility   [facility] [place], [place] P, DO   2 years ago Chronic right shoulder pain   [facility] Dorcas Carrow, DO       Future Appointments             In 2 days Dorcas Carrow, DO [place] [facility]            Passed - Patient is not pregnant      Passed - Last BP in normal range  BP Readings from Last 1 Encounters:  02/15/22 138/77

## 2022-09-05 ENCOUNTER — Telehealth: Payer: Self-pay | Admitting: Family Medicine

## 2022-09-05 ENCOUNTER — Ambulatory Visit: Payer: Self-pay | Admitting: *Deleted

## 2022-09-05 ENCOUNTER — Ambulatory Visit (INDEPENDENT_AMBULATORY_CARE_PROVIDER_SITE_OTHER): Payer: Medicare HMO | Admitting: Family Medicine

## 2022-09-05 ENCOUNTER — Encounter: Payer: Self-pay | Admitting: Family Medicine

## 2022-09-05 VITALS — BP 123/64 | HR 53 | Temp 97.7°F | Ht 67.0 in | Wt 205.8 lb

## 2022-09-05 DIAGNOSIS — N183 Chronic kidney disease, stage 3 unspecified: Secondary | ICD-10-CM | POA: Diagnosis not present

## 2022-09-05 DIAGNOSIS — I129 Hypertensive chronic kidney disease with stage 1 through stage 4 chronic kidney disease, or unspecified chronic kidney disease: Secondary | ICD-10-CM

## 2022-09-05 DIAGNOSIS — Z0000 Encounter for general adult medical examination without abnormal findings: Secondary | ICD-10-CM | POA: Diagnosis not present

## 2022-09-05 DIAGNOSIS — Z87891 Personal history of nicotine dependence: Secondary | ICD-10-CM

## 2022-09-05 DIAGNOSIS — J449 Chronic obstructive pulmonary disease, unspecified: Secondary | ICD-10-CM | POA: Diagnosis not present

## 2022-09-05 DIAGNOSIS — N401 Enlarged prostate with lower urinary tract symptoms: Secondary | ICD-10-CM

## 2022-09-05 DIAGNOSIS — I2510 Atherosclerotic heart disease of native coronary artery without angina pectoris: Secondary | ICD-10-CM

## 2022-09-05 DIAGNOSIS — R3911 Hesitancy of micturition: Secondary | ICD-10-CM

## 2022-09-05 DIAGNOSIS — E782 Mixed hyperlipidemia: Secondary | ICD-10-CM | POA: Diagnosis not present

## 2022-09-05 LAB — MICROALBUMIN, URINE WAIVED
Creatinine, Urine Waived: 200 mg/dL (ref 10–300)
Microalb, Ur Waived: 30 mg/L — ABNORMAL HIGH (ref 0–19)
Microalb/Creat Ratio: <30 mg/g (ref <30)

## 2022-09-05 LAB — URINALYSIS, ROUTINE W REFLEX MICROSCOPIC
Bilirubin, UA: NEGATIVE
Glucose, UA: NEGATIVE
Ketones, UA: NEGATIVE
Leukocytes,UA: NEGATIVE
Nitrite, UA: NEGATIVE
Protein,UA: NEGATIVE
RBC, UA: NEGATIVE
Specific Gravity, UA: 1.02 (ref 1.005–1.030)
Urobilinogen, Ur: 0.2 mg/dL (ref 0.2–1.0)
pH, UA: 5 (ref 5.0–7.5)

## 2022-09-05 MED ORDER — LISINOPRIL 40 MG PO TABS: 40.0000 mg | ORAL_TABLET | Freq: Every day | ORAL | 0 refills | Status: DC

## 2022-09-05 MED ORDER — ALBUTEROL SULFATE HFA 108 (90 BASE) MCG/ACT IN AERS: INHALATION_SPRAY | RESPIRATORY_TRACT | 6 refills | Status: DC

## 2022-09-05 MED ORDER — TADALAFIL 20 MG PO TABS: 10.0000 mg | ORAL_TABLET | ORAL | 11 refills | Status: DC | PRN

## 2022-09-05 MED ORDER — METOPROLOL TARTRATE 25 MG PO TABS: 25.0000 mg | ORAL_TABLET | Freq: Two times a day (BID) | ORAL | 0 refills | Status: DC

## 2022-09-05 MED ORDER — SIMVASTATIN 40 MG PO TABS: 40.0000 mg | ORAL_TABLET | Freq: Every day | ORAL | 1 refills | Status: DC

## 2022-09-05 MED ORDER — TAMSULOSIN HCL 0.4 MG PO CAPS: 0.4000 mg | ORAL_CAPSULE | Freq: Every day | ORAL | 1 refills | Status: DC

## 2022-09-05 NOTE — Assessment & Plan Note (Signed)
Under good control on current regimen. Continue current regimen. Continue to monitor. Call with any concerns. Refills given. Labs drawn today.   

## 2022-09-05 NOTE — Assessment & Plan Note (Signed)
Missed last year on his lung cancer screening. Will get him back on board. Ordered today.

## 2022-09-05 NOTE — Progress Notes (Signed)
BP 123/64   Pulse (!) 53   Temp 97.7 F (36.5 C)   Ht 5\' 7"  (1.702 m)   Wt 205 lb 12.8 oz (93.4 kg)   SpO2 97%   BMI 32.23 kg/m    Subjective:    Patient ID: Mike Footman., male    DOB: [birth year], 67 y.o.   MRN: [id number]  HPI: Mike Schellhorn Franek Montez Hageman. is a 67 y.o. male presenting on 09/05/2022 for comprehensive medical examination. Current medical complaints include:  HYPERTENSION / HYPERLIPIDEMIA Satisfied with current treatment? yes Duration of hypertension: chronic BP monitoring frequency: not checking BP medication side effects: no Past BP meds: metoprolol, lisinopril Duration of hyperlipidemia: chronic Cholesterol medication side effects: no Cholesterol supplements: none Past cholesterol medications: simvastatin Medication compliance: excellent compliance Aspirin: yes Recent stressors: no Recurrent headaches: no Visual changes: no Palpitations: no Dyspnea: no Chest pain: no Lower extremity edema: no Dizzy/lightheaded: no  BPH BPH status: controlled Satisfied with current treatment?: yes Medication side effects: no Medication compliance: excellent compliance Duration: chronic Nocturia: 1/night Urinary frequency:no Incomplete voiding: no Urgency: no Weak urinary stream: no Straining to start stream: no Dysuria: no Onset: gradual Severity: mild  Interim Problems from his last visit: no  Depression Screen done today and results listed below:     09/05/2022   10:01 AM 09/05/2022   10:00 AM 09/02/2022   11:30 AM 02/15/2022    2:22 PM 08/27/2021    9:07 AM  Depression screen PHQ 2/9  Decreased Interest  0 0 0 0  Down, Depressed, Hopeless  0 0 0 0  PHQ - 2 Score  0 0 0 0  Altered sleeping  0 0 0 0  Tired, decreased energy  0 0 0 0  Change in appetite  0 0 0 0  Feeling bad or failure about yourself   0 0 0 0  Trouble concentrating  0 0 0 0  Moving slowly or fidgety/restless  0 0 0 0  Suicidal thoughts  0 0 0 0  PHQ-9 Score  0 0 0 0   Difficult doing work/chores Not difficult at all  Not difficult at all Not difficult at all     Past Medical History:  Past Medical History:  Diagnosis Date   Arthritis    Benign enlargement of prostate    CAD (coronary artery disease)    Chronic kidney disease    History of seizures as a child    Due to head injury   Hyperlipidemia    Hypertension    Lumbago    Myocardial infarction    Urinary hesitancy     Surgical History:  Past Surgical History:  Procedure Laterality Date   COLONOSCOPY WITH PROPOFOL N/A 10/01/2019   Procedure: COLONOSCOPY WITH PROPOFOL;  Surgeon: Toney Reil, MD;  Location: [facility] ENDOSCOPY;  Service: Gastroenterology;  Laterality: N/A;   coronary artery stent      Medications:  Current Outpatient Medications on File Prior to Visit  Medication Sig   aspirin EC 81 MG tablet Take 81 mg by mouth daily.    EPINEPHrine 0.3 mg/0.3 mL IJ SOAJ injection Inject 0.3 mg into the muscle as needed for anaphylaxis.   No current facility-administered medications on file prior to visit.    Allergies:  Allergies  Allergen Reactions   Shellfish Allergy Anaphylaxis   Peanuts [Peanut Oil] Rash   [place] Gum [Fish Allergy] Hives    Social History:  Social History   Socioeconomic History   Marital status:  Divorced    Spouse name: Not on file   Number of children: Not on file   Years of education: Not on file   Highest education level: Some college, no degree  Occupational History   Occupation: retired  Tobacco Use   Smoking status: Former    Packs/day: 1.00    Years: 30.00    Additional pack years: 0.00    Total pack years: 30.00    Types: Cigarettes    Quit date: 04/12/2016    Years since quitting: 6.4   Smokeless tobacco: Never   Tobacco comments:    since 15, max 1 ppd x10  Vaping Use   Vaping Use: Never used  Substance and Sexual Activity   Alcohol use: No    Alcohol/week: 0.0 standard drinks of alcohol   Drug use: No   Sexual activity: Yes     Birth control/protection: None  Other Topics Concern   Not on file  Social History Narrative   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    Social Determinants of Health   Financial Resource Strain: Low Risk  (09/02/2022)   Overall Financial Resource Strain (CARDIA)    Difficulty of Paying Living Expenses: Not hard at all  Food Insecurity: No Food Insecurity (09/02/2022)   Hunger Vital Sign    Worried About Running Out of Food in the Last Year: Never true    Ran Out of Food in the Last Year: Never true  Transportation Needs: No Transportation Needs (09/02/2022)   PRAPARE - Administrator, Civil Service (Medical): No    Lack of Transportation (Non-Medical): No  Physical Activity: Sufficiently Active (09/02/2022)   Exercise Vital Sign    Days of Exercise per Week: 7 days    Minutes of Exercise per Session: 60 min  Stress: No Stress Concern Present (09/02/2022)   [name] of Occupational Health - Occupational Stress Questionnaire    Feeling of Stress : Not at all  Social Connections: Moderately Isolated (09/02/2022)   Social Connection and Isolation Panel [NHANES]    Frequency of Communication with Friends and Family: Once a week    Frequency of Social Gatherings with Friends and Family: More than three times a week    Attends Religious Services: Never    Database administrator or Organizations: Yes    Attends Engineer, structural: More than 4 times per year    Marital Status: Divorced  Intimate Partner Violence: Not At Risk (09/02/2022)   Humiliation, Afraid, Rape, and Kick questionnaire    Fear of Current or Ex-Partner: No    Emotionally Abused: No    Physically Abused: No    Sexually Abused: No   Social History   Tobacco Use  Smoking Status Former   Packs/day: 1.00   Years: 30.00   Additional pack years: 0.00   Total pack years: 30.00   Types: Cigarettes   Quit date: 04/12/2016   Years since quitting: 6.4  Smokeless Tobacco Never   Tobacco Comments   since 15, max 1 ppd x10   Social History   Substance and Sexual Activity  Alcohol Use No   Alcohol/week: 0.0 standard drinks of alcohol    Family History:  Family History  Problem Relation Age of Onset   Hypertension Mother    Breast cancer Mother    Hypertension Father    Stroke Father    Stroke Maternal Grandfather     Past medical history, surgical  history, medications, allergies, family history and social history reviewed with patient today and changes made to appropriate areas of the chart.   Review of Systems  Constitutional: Negative.   HENT: Negative.    Eyes: Negative.   Respiratory: Negative.    Cardiovascular: Negative.   Gastrointestinal: Negative.   Genitourinary: Negative.   Musculoskeletal: Negative.   Skin: Negative.   Neurological: Negative.   Endo/Heme/Allergies: Negative.   Psychiatric/Behavioral: Negative.     All other ROS negative except what is listed above and in the HPI.      Objective:    BP 123/64   Pulse (!) 53   Temp 97.7 F (36.5 C)   Ht 5\' 7"  (1.702 m)   Wt 205 lb 12.8 oz (93.4 kg)   SpO2 97%   BMI 32.23 kg/m   Wt Readings from Last 3 Encounters:  09/05/22 205 lb 12.8 oz (93.4 kg)  09/02/22 204 lb 9.6 oz (92.8 kg)  02/15/22 208 lb 11.2 oz (94.7 kg)    Physical Exam Vitals and nursing note reviewed.  Constitutional:      General: He is not in acute distress.    Appearance: Normal appearance. He is not ill-appearing, toxic-appearing or diaphoretic.  HENT:     Head: Normocephalic and atraumatic.     Right Ear: Tympanic membrane, ear canal and external ear normal. There is no impacted cerumen.     Left Ear: Tympanic membrane, ear canal and external ear normal. There is no impacted cerumen.     Nose: Nose normal. No congestion or rhinorrhea.     Mouth/Throat:     Mouth: Mucous membranes are moist.     Pharynx: Oropharynx is clear. No oropharyngeal exudate or posterior oropharyngeal erythema.  Eyes:      General: No scleral icterus.       Right eye: No discharge.        Left eye: No discharge.     Extraocular Movements: Extraocular movements intact.     Conjunctiva/sclera: Conjunctivae normal.     Pupils: Pupils are equal, round, and reactive to light.  Neck:     Vascular: No carotid bruit.  Cardiovascular:     Rate and Rhythm: Normal rate and regular rhythm.     Pulses: Normal pulses.     Heart sounds: No murmur heard.    No friction rub. No gallop.  Pulmonary:     Effort: Pulmonary effort is normal. No respiratory distress.     Breath sounds: Normal breath sounds. No stridor. No wheezing, rhonchi or rales.  Chest:     Chest wall: No tenderness.  Abdominal:     General: Abdomen is flat. Bowel sounds are normal. There is no distension.     Palpations: Abdomen is soft. There is no mass.     Tenderness: There is no abdominal tenderness. There is no right CVA tenderness, left CVA tenderness, guarding or rebound.     Hernia: No hernia is present.  Genitourinary:    Comments: Genital exam deferred with shared decision making Musculoskeletal:        General: No swelling, tenderness, deformity or signs of injury.     Cervical back: Normal range of motion and neck supple. No rigidity. No muscular tenderness.     Right lower leg: No edema.     Left lower leg: No edema.  Lymphadenopathy:     Cervical: No cervical adenopathy.  Skin:    General: Skin is warm and dry.     Capillary Refill:  Capillary refill takes less than 2 seconds.     Coloration: Skin is not jaundiced or pale.     Findings: No bruising, erythema, lesion or rash.  Neurological:     General: No focal deficit present.     Mental Status: He is alert and oriented to person, place, and time.     Cranial Nerves: No cranial nerve deficit.     Sensory: No sensory deficit.     Motor: No weakness.     Coordination: Coordination normal.     Gait: Gait normal.     Deep Tendon Reflexes: Reflexes normal.  Psychiatric:         Mood and Affect: Mood normal.        Behavior: Behavior normal.        Thought Content: Thought content normal.        Judgment: Judgment normal.     Results for orders placed or performed in visit on 02/15/22  Comprehensive metabolic panel  Result Value Ref Range   Glucose 102 (H) 70 - 99 mg/dL   BUN 18 8 - 27 mg/dL   Creatinine, Ser 1.61 0.76 - 1.27 mg/dL   eGFR 64 >09 UE/AVW/0.98   BUN/Creatinine Ratio 15 10 - 24   Sodium 136 134 - 144 mmol/L   Potassium 4.2 3.5 - 5.2 mmol/L   Chloride 100 96 - 106 mmol/L   CO2 21 20 - 29 mmol/L   Calcium 9.4 8.6 - 10.2 mg/dL   Total Protein 6.8 6.0 - 8.5 g/dL   Albumin 4.3 3.9 - 4.9 g/dL   Globulin, Total 2.5 1.5 - 4.5 g/dL   Albumin/Globulin Ratio 1.7 1.2 - 2.2   Bilirubin Total 0.4 0.0 - 1.2 mg/dL   Alkaline Phosphatase 137 (H) 44 - 121 IU/L   AST 39 0 - 40 IU/L   ALT 27 0 - 44 IU/L  CBC with Differential/Platelet  Result Value Ref Range   WBC 5.4 3.4 - 10.8 x10E3/uL   RBC 5.57 4.14 - 5.80 x10E6/uL   Hemoglobin 14.3 13.0 - 17.7 g/dL   Hematocrit 11.9 14.7 - 51.0 %   MCV 77 (L) 79 - 97 fL   MCH 25.7 (L) 26.6 - 33.0 pg   MCHC 33.5 31.5 - 35.7 g/dL   RDW 82.9 56.2 - 13.0 %   Platelets 176 150 - 450 x10E3/uL   Neutrophils 38 Not Estab. %   Lymphs 42 Not Estab. %   Monocytes 11 Not Estab. %   Eos 7 Not Estab. %   Basos 1 Not Estab. %   Neutrophils Absolute 2.1 1.4 - 7.0 x10E3/uL   Lymphocytes Absolute 2.3 0.7 - 3.1 x10E3/uL   Monocytes Absolute 0.6 0.1 - 0.9 x10E3/uL   EOS (ABSOLUTE) 0.4 0.0 - 0.4 x10E3/uL   Basophils Absolute 0.1 0.0 - 0.2 x10E3/uL   Immature Granulocytes 1 Not Estab. %   Immature Grans (Abs) 0.0 0.0 - 0.1 x10E3/uL  PSA  Result Value Ref Range   Prostate Specific Ag, Serum 0.5 0.0 - 4.0 ng/mL  Lipid Panel w/o Chol/HDL Ratio  Result Value Ref Range   Cholesterol, Total 182 100 - 199 mg/dL   Triglycerides 865 0 - 149 mg/dL   HDL 71 >78 mg/dL   VLDL Cholesterol Cal 18 5 - 40 mg/dL   LDL Chol Calc (NIH) 93 0  - 99 mg/dL  Specimen status report  Result Value Ref Range   specimen status report Comment       Assessment &  Plan:   Problem List Items Addressed This Visit       Cardiovascular and Mediastinum   CAD (coronary artery disease)    Will keep BP and cholesterol under good control. Call with any concerns.       Relevant Medications   lisinopril (ZESTRIL) 40 MG tablet   metoprolol tartrate (LOPRESSOR) 25 MG tablet   simvastatin (ZOCOR) 40 MG tablet   tadalafil (CIALIS) 20 MG tablet   Other Relevant Orders   Comprehensive metabolic panel   CBC with Differential/Platelet     Respiratory   COPD (chronic obstructive pulmonary disease)    Under good control on current regimen. Continue current regimen. Continue to monitor. Call with any concerns. Refills given. Labs drawn today.      Relevant Medications   albuterol (VENTOLIN HFA) 108 (90 Base) MCG/ACT inhaler     Genitourinary   Benign hypertensive renal disease    Under good control on current regimen. Continue current regimen. Continue to monitor. Call with any concerns. Refills given. Labs drawn today.      Relevant Orders   Comprehensive metabolic panel   CBC with Differential/Platelet   TSH   Urinalysis, Routine w reflex microscopic   Microalbumin, Urine Waived   CKD (chronic kidney disease), stage III    Rechecking labs today. Await results. Treat as needed.       Relevant Orders   Comprehensive metabolic panel   CBC with Differential/Platelet     Other   HLD (hyperlipidemia)    Under good control on current regimen. Continue current regimen. Continue to monitor. Call with any concerns. Refills given. Labs drawn today.       Relevant Medications   lisinopril (ZESTRIL) 40 MG tablet   metoprolol tartrate (LOPRESSOR) 25 MG tablet   simvastatin (ZOCOR) 40 MG tablet   tadalafil (CIALIS) 20 MG tablet   Other Relevant Orders   Comprehensive metabolic panel   CBC with Differential/Platelet   Lipid Panel w/o  Chol/HDL Ratio   Urinary hesitancy due to benign prostatic hyperplasia    Under good control on current regimen. Continue current regimen. Continue to monitor. Call with any concerns. Refills given. Labs drawn today.      Relevant Orders   Comprehensive metabolic panel   CBC with Differential/Platelet   PSA   Personal history of tobacco use, presenting hazards to health    Missed last year on his lung cancer screening. Will get him back on board. Ordered today.      Relevant Orders   CT CHEST LUNG CA SCREEN LOW DOSE W/O CM   Other Visit Diagnoses     Routine general medical examination at a health care facility    -  Primary   Vaccines up to date. Screening labs checked today. Colonoscopy up to date. Continue diet and exercise. Call with any concerns.        Discussed aspirin prophylaxis for myocardial infarction prevention and decision was made to continue ASA  LABORATORY TESTING:  Health maintenance labs ordered today as discussed above.   The natural history of prostate cancer and ongoing controversy regarding screening and potential treatment outcomes of prostate cancer has been discussed with the patient. The meaning of a false positive PSA and a false negative PSA has been discussed. He indicates understanding of the limitations of this screening test and wishes to proceed with screening PSA testing.   IMMUNIZATIONS:   - Tdap: Tetanus vaccination status reviewed: last tetanus booster within 10 years. - Influenza: Up  to date - Pneumovax: Refused - Prevnar: Refused - COVID: Up to date - HPV: Not applicable - Shingrix vaccine: Refused  SCREENING: - Colonoscopy: Up to date  Discussed with patient purpose of the colonoscopy is to detect colon cancer at curable precancerous or early stages    PATIENT COUNSELING:    Sexuality: Discussed sexually transmitted diseases, partner selection, use of condoms, avoidance of unintended pregnancy  and contraceptive alternatives.    Advised to avoid cigarette smoking.  I discussed with the patient that most people either abstain from alcohol or drink within safe limits (<=14/week and <=4 drinks/occasion for males, <=7/weeks and <= 3 drinks/occasion for females) and that the risk for alcohol disorders and other health effects rises proportionally with the number of drinks per week and how often a drinker exceeds daily limits.  Discussed cessation/primary prevention of drug use and availability of treatment for abuse.   Diet: Encouraged to adjust caloric intake to maintain  or achieve ideal body weight, to reduce intake of dietary saturated fat and total fat, to limit sodium intake by avoiding high sodium foods and not adding table salt, and to maintain adequate dietary potassium and calcium preferably from fresh fruits, vegetables, and low-fat dairy products.    stressed the importance of regular exercise  Injury prevention: Discussed safety belts, safety helmets, smoke detector, smoking near bedding or upholstery.   Dental health: Discussed importance of regular tooth brushing, flossing, and dental visits.   Follow up plan: NEXT PREVENTATIVE PHYSICAL DUE IN 1 YEAR. Return in about 6 months (around 03/07/2023).

## 2022-09-05 NOTE — Telephone Encounter (Signed)
Summary: Advice CKD   Pt is calling to ask advise that he saw in his AVS Pt is calling to ask beign HTN kidney disease CKD (chronic kidney disease), stage III Please advise          Called patient on [phone] to review medical questions. No answer, VM box full unable to leave message to call back.

## 2022-09-05 NOTE — Assessment & Plan Note (Signed)
Rechecking labs today. Await results. Treat as needed.  °

## 2022-09-05 NOTE — Telephone Encounter (Addendum)
Please advise. 3rd attempt to contact patient [phone] no answer, no VM to leave message. Attempted DPR Leib Elahi on DPR [phone] to have patient call back but number recording states it is [employer]. No message left. Please advise.     Reason for Disposition  Third attempt to contact caller AND no contact made. Phone number verified.  Answer Assessment - Initial Assessment Questions N/A No contact with patient or DPR  Protocols used: No Contact or Duplicate Contact Call-A-AH

## 2022-09-05 NOTE — Assessment & Plan Note (Signed)
Will keep BP and cholesterol under good control. Call with any concerns.  ?

## 2022-09-05 NOTE — Telephone Encounter (Signed)
2nd attempt to contact patient on [phone]  to review medication questions. No answer, no vm box set up unable to leave message to call back.

## 2022-09-06 ENCOUNTER — Encounter: Payer: Self-pay | Admitting: Family Medicine

## 2022-09-06 LAB — COMPREHENSIVE METABOLIC PANEL
ALT: 24 IU/L (ref 0–44)
AST: 41 IU/L — ABNORMAL HIGH (ref 0–40)
Albumin/Globulin Ratio: 1.7 (ref 1.2–2.2)
Albumin: 4.3 g/dL (ref 3.9–4.9)
Alkaline Phosphatase: 128 IU/L — ABNORMAL HIGH (ref 44–121)
BUN/Creatinine Ratio: 15 (ref 10–24)
BUN: 22 mg/dL (ref 8–27)
Bilirubin Total: 0.5 mg/dL (ref 0.0–1.2)
CO2: 22 mmol/L (ref 20–29)
Calcium: 9.5 mg/dL (ref 8.6–10.2)
Chloride: 102 mmol/L (ref 96–106)
Creatinine, Ser: 1.46 mg/dL — ABNORMAL HIGH (ref 0.76–1.27)
Globulin, Total: 2.6 g/dL (ref 1.5–4.5)
Glucose: 130 mg/dL — ABNORMAL HIGH (ref 70–99)
Potassium: 4.6 mmol/L (ref 3.5–5.2)
Sodium: 139 mmol/L (ref 134–144)
Total Protein: 6.9 g/dL (ref 6.0–8.5)
eGFR: 52 mL/min/{1.73_m2} — ABNORMAL LOW (ref >59)

## 2022-09-06 LAB — LIPID PANEL W/O CHOL/HDL RATIO
Cholesterol, Total: 175 mg/dL (ref 100–199)
HDL: 69 mg/dL (ref >39)
LDL Chol Calc (NIH): 94 mg/dL (ref 0–99)
Triglycerides: 62 mg/dL (ref 0–149)
VLDL Cholesterol Cal: 12 mg/dL (ref 5–40)

## 2022-09-06 LAB — CBC WITH DIFFERENTIAL/PLATELET
Basophils Absolute: 0.1 10*3/uL (ref 0.0–0.2)
Basos: 1 %
EOS (ABSOLUTE): 0.6 10*3/uL — ABNORMAL HIGH (ref 0.0–0.4)
Eos: 10 %
Hematocrit: 43.8 % (ref 37.5–51.0)
Hemoglobin: 14 g/dL (ref 13.0–17.7)
Immature Grans (Abs): 0 10*3/uL (ref 0.0–0.1)
Immature Granulocytes: 1 %
Lymphocytes Absolute: 2.3 10*3/uL (ref 0.7–3.1)
Lymphs: 37 %
MCH: 25.2 pg — ABNORMAL LOW (ref 26.6–33.0)
MCHC: 32 g/dL (ref 31.5–35.7)
MCV: 79 fL (ref 79–97)
Monocytes Absolute: 0.6 10*3/uL (ref 0.1–0.9)
Monocytes: 9 %
Neutrophils Absolute: 2.6 10*3/uL (ref 1.4–7.0)
Neutrophils: 42 %
Platelets: 193 10*3/uL (ref 150–450)
RBC: 5.55 x10E6/uL (ref 4.14–5.80)
RDW: 13.3 % (ref 11.6–15.4)
WBC: 6.2 10*3/uL (ref 3.4–10.8)

## 2022-09-06 LAB — TSH: TSH: 1.16 u[IU]/mL (ref 0.450–4.500)

## 2022-09-06 LAB — PSA: Prostate Specific Ag, Serum: 0.5 ng/mL (ref 0.0–4.0)

## 2022-09-09 ENCOUNTER — Other Ambulatory Visit: Payer: Self-pay | Admitting: Family Medicine

## 2022-09-10 NOTE — Telephone Encounter (Signed)
Requested Prescriptions  Pending Prescriptions Disp Refills   EPINEPHRINE 0.3 mg/0.3 mL IJ SOAJ injection [Pharmacy Med Name: EPINEPHRINE 0.3 MG/0.3ML Solution Auto-injector] 2 each 3    Sig: INJECT 0.3 MG INTO THE MUSCLE AS NEEDED FOR ANAPHYLAXIS.     Immunology: Antidotes Passed - 09/09/2022  4:56 AM      Passed - Valid encounter within last 12 months    Recent Outpatient Visits           5 days ago Routine general medical examination at a health care facility   [facility], [place] P, DO   6 months ago Benign hypertensive renal disease   [facility] Dorcas Carrow, DO   1 year ago Routine general medical examination at a health care facility   [facility], [place] P, DO   1 year ago Benign hypertensive renal disease   [facility] Dorcas Carrow, DO   2 years ago Routine general medical examination at a health care facility   [facility] Dorcas Carrow, DO       Future Appointments             In 5 months Laural Benes, Oralia Rud, DO [facility]

## 2022-11-11 ENCOUNTER — Other Ambulatory Visit: Payer: Self-pay | Admitting: Family Medicine

## 2022-11-11 NOTE — Telephone Encounter (Addendum)
Graciella Belton from [employer] has called stating they have faxed over a medication refill request for medications below, verified it was faxed to [phone] & it was faxed on 11/06/2022. If Graciella Belton doesn't hear anything within 72 hours, Graciella Belton stated he would call back to check on the status of this.   albuterol (VENTOLIN HFA) 108 (90 Base) MCG/ACT inhaler  EPINEPHRINE 0.3 mg/0.3 mL IJ SOAJ injection   lisinopril (ZESTRIL) 40 MG tablet   metoprolol tartrate (LOPRESSOR) 25 MG tablet   simvastatin (ZOCOR) 40 MG tablet   tamsulosin (FLOMAX) 0.4 MG CAPS capsule   [facility] [place] [address] - [address] [[id number]]   # [phone] ([facility] direct Line) Pharmacy: [phone] (Pharmacy line) # [phone] (fax)  Does [facility] do [facility]? YES.   Patient has an upcoming appointment on 03/07/2023 with Dr. Laural Benes.

## 2022-11-12 ENCOUNTER — Other Ambulatory Visit: Payer: Self-pay

## 2022-11-12 MED ORDER — ALBUTEROL SULFATE HFA 108 (90 BASE) MCG/ACT IN AERS: INHALATION_SPRAY | RESPIRATORY_TRACT | 6 refills | Status: DC

## 2022-11-12 MED ORDER — LISINOPRIL 40 MG PO TABS: 40.0000 mg | ORAL_TABLET | Freq: Every day | ORAL | 0 refills | Status: DC

## 2022-11-12 MED ORDER — EPINEPHRINE 0.3 MG/0.3ML IJ SOAJ: 0.3000 mg | INTRAMUSCULAR | 3 refills | Status: DC | PRN

## 2022-11-12 MED ORDER — METOPROLOL TARTRATE 25 MG PO TABS: 25.0000 mg | ORAL_TABLET | Freq: Two times a day (BID) | ORAL | 0 refills | Status: DC

## 2022-11-12 MED ORDER — SIMVASTATIN 40 MG PO TABS: 40.0000 mg | ORAL_TABLET | Freq: Every day | ORAL | 1 refills | Status: DC

## 2022-11-12 MED ORDER — TAMSULOSIN HCL 0.4 MG PO CAPS: 0.4000 mg | ORAL_CAPSULE | Freq: Every day | ORAL | 1 refills | Status: DC

## 2022-11-12 NOTE — Telephone Encounter (Signed)
Unable to refill per protocol, Rx request was refilled 11/12/22.E-Prescribing Status: Receipt confirmed by pharmacy (11/12/2022 11:51 AM EDT).  Requested Prescriptions  Pending Prescriptions Disp Refills   albuterol (VENTOLIN HFA) 108 (90 Base) MCG/ACT inhaler 18 g 6    Sig: INHALE 2 PUFFS INTO THE LUNGS EVERY 6  HOURS AS NEEDED FOR WHEEZING OR SHORTNESS OF BREATH.     Pulmonology:  Beta Agonists 2 Passed - 11/11/2022 10:19 AM      Passed - Last BP in normal range    BP Readings from Last 1 Encounters:  09/05/22 123/64         Passed - Last Heart Rate in normal range    Pulse Readings from Last 1 Encounters:  09/05/22 (!) 53         Passed - Valid encounter within last 12 months    Recent Outpatient Visits           2 months ago Routine general medical examination at a health care facility   [facility], Megan P, DO   9 months ago Benign hypertensive renal disease   [facility] [place], [place] P, DO   1 year ago Routine general medical examination at a health care facility   [facility] [place], [place] P, DO   1 year ago Benign hypertensive renal disease   [place] [facility], [place] P, DO   2 years ago Routine general medical examination at a health care facility   [facility] [place], [place] P, DO       Future Appointments             In 3 months Laural Benes, Oralia Rud, DO [place] [facility]             EPINEPHrine 0.3 mg/0.3 mL IJ SOAJ injection 2 each 3    Sig: Inject 0.3 mg into the muscle as needed for anaphylaxis.     Immunology: Antidotes Passed - 11/11/2022 10:19 AM      Passed - Valid encounter within last 12 months    Recent Outpatient Visits           2 months ago Routine general medical examination at a health care facility   [facility] DO   9 months ago Benign  hypertensive renal disease   [facility] [place], [place] P, DO   1 year ago Routine general medical examination at a health care facility   [facility] [place], [place] P, DO   1 year ago Benign hypertensive renal disease   [facility] [place], Megan P, DO   2 years ago Routine general medical examination at a health care facility   [facility], [place] P, DO       Future Appointments             In 3 months Laural Benes, Oralia Rud, DO [place] [facility]             lisinopril (ZESTRIL) 40 MG tablet 90 tablet 0    Sig: Take 1 tablet (40 mg total) by mouth daily.     Cardiovascular:  ACE Inhibitors Failed - 11/11/2022 10:19 AM      Failed - Cr in normal range and within 180 days    Creatinine, Ser  Date Value Ref  Range Status  09/05/2022 1.46 (H) 0.76 - 1.27 mg/dL Final         Passed - K in normal range and within 180 days    Potassium  Date Value Ref Range Status  09/05/2022 4.6 3.5 - 5.2 mmol/L Final         Passed - Patient is not pregnant      Passed - Last BP in normal range    BP Readings from Last 1 Encounters:  09/05/22 123/64         Passed - Valid encounter within last 6 months    Recent Outpatient Visits           2 months ago Routine general medical examination at a health care facility   [facility], DO   9 months ago Benign hypertensive renal disease   [facility] [place], [place] P, DO   1 year ago Routine general medical examination at a health care facility   [facility] [place], [place], DO   1 year ago Benign hypertensive renal disease   [place] [facility] [place], Megan P, DO   2 years ago Routine general medical examination at a health care facility   [facility] [place], [place] P, DO        Future Appointments             In 3 months Laural Benes, Oralia Rud, DO [place] [facility]             metoprolol tartrate (LOPRESSOR) 25 MG tablet 180 tablet 0    Sig: Take 1 tablet (25 mg total) by mouth 2 (two) times daily.     Cardiovascular:  Beta Blockers Passed - 11/11/2022 10:19 AM      Passed - Last BP in normal range    BP Readings from Last 1 Encounters:  09/05/22 123/64         Passed - Last Heart Rate in normal range    Pulse Readings from Last 1 Encounters:  09/05/22 (!) 53         Passed - Valid encounter within last 6 months    Recent Outpatient Visits           2 months ago Routine general medical examination at a health care facility   [facility], DO   9 months ago Benign hypertensive renal disease   [facility] [place], [place] P, DO   1 year ago Routine general medical examination at a health care facility   [facility] [place], [place] P, DO   1 year ago Benign hypertensive renal disease   [facility] [place], [place] P, DO   2 years ago Routine general medical examination at a health care facility   [facility] [place], [place] P, DO       Future Appointments             In 3 months Laural Benes, Oralia Rud, DO [place] [facility]             simvastatin (ZOCOR) 40 MG tablet 90 tablet 1    Sig: Take 1 tablet (40 mg total) by mouth daily.     Cardiovascular:  Antilipid - Statins Failed - 11/11/2022 10:19 AM      Failed - Lipid  Panel in normal range within the last 12 months    Cholesterol, Total  Date Value Ref Range Status  09/05/2022 175 100 - 199 mg/dL Final   Cholesterol [place]  Date Value Ref Range Status  04/02/2016 142 <200 mg/dL Final    Comment:                            Desirable                <200                         Borderline High      200-  239                         High                     >239    LDL Chol Calc (NIH)  Date Value Ref Range Status  09/05/2022 94 0 - 99 mg/dL Final   HDL  Date Value Ref Range Status  09/05/2022 69 >39 mg/dL Final   Triglycerides  Date Value Ref Range Status  09/05/2022 62 0 - 149 mg/dL Final   Triglycerides [place],Waived  Date Value Ref Range Status  04/02/2016 53 <150 mg/dL Final    Comment:                            Normal                   <150                         Borderline High     150 - 199                         High                200 - 499                         Very High                >499          Passed - Patient is not pregnant      Passed - Valid encounter within last 12 months    Recent Outpatient Visits           2 months ago Routine general medical examination at a health care facility   [facility], [facility], DO   9 months ago Benign hypertensive renal disease   [facility] [place], [place] P, DO   1 year ago Routine general medical examination at a health care facility   [facility] [place], [place] P, DO   1 year ago Benign hypertensive renal disease   [facility] [place], [facility] P, DO   2 years ago Routine general medical examination at a health care facility   [facility] Dorcas Carrow, DO       Future Appointments             In 3 months Dorcas Carrow, DO [facility],  [facility]

## 2022-11-12 NOTE — Telephone Encounter (Signed)
Called pt to see where pt wants refills to go. Pt currently has refills available at a different pharmacy.  Unable to leave a message.

## 2022-12-11 ENCOUNTER — Telehealth: Payer: Self-pay | Admitting: Family Medicine

## 2022-12-11 NOTE — Telephone Encounter (Signed)
Disregard

## 2022-12-12 ENCOUNTER — Ambulatory Visit: Payer: 59 | Admitting: Family Medicine

## 2023-01-26 ENCOUNTER — Other Ambulatory Visit: Payer: Self-pay | Admitting: Family Medicine

## 2023-01-28 NOTE — Telephone Encounter (Signed)
Requested Prescriptions  Pending Prescriptions Disp Refills   metoprolol tartrate (LOPRESSOR) 25 MG tablet [Pharmacy Med Name: metoprolol tartrate 25 mg tablet] 180 tablet 11    Sig: TAKE ONE TABLET (25MG  TOTAL) BY MOUTH TWICE DAILY     Cardiovascular:  Beta Blockers Passed - 01/26/2023 10:45 PM      Passed - Last BP in normal range    BP Readings from Last 1 Encounters:  09/05/22 123/64         Passed - Last Heart Rate in normal range    Pulse Readings from Last 1 Encounters:  09/05/22 (!) 53         Passed - Valid encounter within last 6 months    Recent Outpatient Visits           4 months ago Routine general medical examination at a health care facility   [facility], DO   11 months ago Benign hypertensive renal disease   [facility] [place], Megan P, DO   1 year ago Routine general medical examination at a health care facility   [facility] [place], [place] P, DO   1 year ago Benign hypertensive renal disease   [place] [place], Megan P, DO   2 years ago Routine general medical examination at a health care facility   [facility] [place], [place] P, DO       Future Appointments             In 1 month Johnson, Oralia Rud, DO [place] [facility]             lisinopril (ZESTRIL) 40 MG tablet [Pharmacy Med Name: lisinopril 40 mg tablet] 90 tablet 11    Sig: TAKE ONE TABLET (40MG  TOTAL) BY MOUTH DAILY     Cardiovascular:  ACE Inhibitors Failed - 01/26/2023 10:45 PM      Failed - Cr in normal range and within 180 days    Creatinine, Ser  Date Value Ref Range Status  09/05/2022 1.46 (H) 0.76 - 1.27 mg/dL Final         Passed - K in normal range and within 180 days    Potassium  Date Value Ref Range Status  09/05/2022 4.6 3.5 - 5.2 mmol/L Final         Passed - Patient is not pregnant      Passed - Last  BP in normal range    BP Readings from Last 1 Encounters:  09/05/22 123/64         Passed - Valid encounter within last 6 months    Recent Outpatient Visits           4 months ago Routine general medical examination at a health care facility   [facility], [facility] P, DO   11 months ago Benign hypertensive renal disease   [place] [facility] [place], [place] P, DO   1 year ago Routine general medical examination at a health care facility   [facility] [place], [place] P, DO   1 year ago Benign hypertensive renal disease   [facility] [place], Megan P, DO   2 years ago Routine general medical examination at a health care facility   [facility] Carrow, DO       Future Appointments  In 1 month Johnson, Oralia Rud, DO [place] [facility]

## 2023-03-07 ENCOUNTER — Ambulatory Visit: Payer: 59 | Admitting: Family Medicine

## 2023-03-07 ENCOUNTER — Encounter: Payer: Self-pay | Admitting: Family Medicine

## 2023-03-07 VITALS — BP 104/62 | HR 50 | Ht 67.0 in | Wt 208.0 lb

## 2023-03-07 DIAGNOSIS — I129 Hypertensive chronic kidney disease with stage 1 through stage 4 chronic kidney disease, or unspecified chronic kidney disease: Secondary | ICD-10-CM | POA: Diagnosis not present

## 2023-03-07 DIAGNOSIS — Z23 Encounter for immunization: Secondary | ICD-10-CM | POA: Diagnosis not present

## 2023-03-07 DIAGNOSIS — E782 Mixed hyperlipidemia: Secondary | ICD-10-CM

## 2023-03-07 DIAGNOSIS — N401 Enlarged prostate with lower urinary tract symptoms: Secondary | ICD-10-CM

## 2023-03-07 DIAGNOSIS — J449 Chronic obstructive pulmonary disease, unspecified: Secondary | ICD-10-CM | POA: Diagnosis not present

## 2023-03-07 DIAGNOSIS — R3911 Hesitancy of micturition: Secondary | ICD-10-CM

## 2023-03-07 DIAGNOSIS — I2510 Atherosclerotic heart disease of native coronary artery without angina pectoris: Secondary | ICD-10-CM

## 2023-03-07 MED ORDER — LISINOPRIL 40 MG PO TABS: 40.0000 mg | ORAL_TABLET | Freq: Every day | ORAL | 1 refills | Status: DC

## 2023-03-07 MED ORDER — TADALAFIL 20 MG PO TABS: 10.0000 mg | ORAL_TABLET | ORAL | 11 refills | Status: AC | PRN

## 2023-03-07 MED ORDER — METOPROLOL TARTRATE 25 MG PO TABS: 25.0000 mg | ORAL_TABLET | Freq: Two times a day (BID) | ORAL | 1 refills | Status: DC

## 2023-03-07 MED ORDER — SIMVASTATIN 40 MG PO TABS: 40.0000 mg | ORAL_TABLET | Freq: Every day | ORAL | 1 refills | Status: DC

## 2023-03-07 MED ORDER — ALBUTEROL SULFATE HFA 108 (90 BASE) MCG/ACT IN AERS: INHALATION_SPRAY | RESPIRATORY_TRACT | 6 refills | Status: DC

## 2023-03-07 MED ORDER — TAMSULOSIN HCL 0.4 MG PO CAPS: 0.4000 mg | ORAL_CAPSULE | Freq: Every day | ORAL | 1 refills | Status: DC

## 2023-03-07 NOTE — Progress Notes (Signed)
BP 104/62   Pulse (!) 50   Ht 5\' 7"  (1.702 m)   Wt 208 lb (94.3 kg)   SpO2 97%   BMI 32.58 kg/m    Subjective:    Patient ID: Mike [name]., male    DOB: [birth year], 67 y.o.   MRN: [id number]  HPI: [name]. is a 67 y.o. male  Chief Complaint  Patient presents with   COPD   Hypertension   Hyperlipidemia   HYPERTENSION / HYPERLIPIDEMIA Satisfied with current treatment? yes Duration of hypertension: chronic BP monitoring frequency: not checking BP medication side effects: no Past BP meds: metoprolol, lisinpril Duration of hyperlipidemia: chronic Cholesterol medication side effects: no Cholesterol supplements: none Past cholesterol medications: simvastatin Medication compliance: excellent compliance Aspirin: yes Recent stressors: no Recurrent headaches: no Visual changes: no Palpitations: no Dyspnea: no Chest pain: no Lower extremity edema: no Dizzy/lightheaded: no  BPH BPH status: controlled Satisfied with current treatment?: yes Medication side effects: no Medication compliance: excellent compliance Duration: chronic Nocturia: 1-2x per night Urinary frequency:no Incomplete voiding: no Urgency: no Weak urinary stream: no Straining to start stream: no Dysuria: no Onset: gradual Severity: mild  COPD COPD status: controlled Satisfied with current treatment?: yes Oxygen use: no Dyspnea frequency: rarely Cough frequency: never Rescue inhaler frequency: never   Limitation of activity: no Pneumovax: Up to Date Influenza: Up to Date  Relevant past medical, surgical, family and social history reviewed and updated as indicated. Interim medical history since our last visit reviewed. Allergies and medications reviewed and updated.  Review of Systems  Constitutional: Negative.   Respiratory: Negative.    Cardiovascular: Negative.   Gastrointestinal: Negative.   Musculoskeletal: Negative.   Neurological: Negative.    Psychiatric/Behavioral: Negative.      Per HPI unless specifically indicated above     Objective:    BP 104/62   Pulse (!) 50   Ht 5\' 7"  (1.702 m)   Wt 208 lb (94.3 kg)   SpO2 97%   BMI 32.58 kg/m   Wt Readings from Last 3 Encounters:  03/07/23 208 lb (94.3 kg)  09/05/22 205 lb 12.8 oz (93.4 kg)  09/02/22 204 lb 9.6 oz (92.8 kg)    Physical Exam Vitals and nursing note reviewed.  Constitutional:      General: He is not in acute distress.    Appearance: Normal appearance. He is not ill-appearing, toxic-appearing or diaphoretic.  HENT:     Head: Normocephalic and atraumatic.     Right Ear: External ear normal.     Left Ear: External ear normal.     Nose: Nose normal.     Mouth/Throat:     Mouth: Mucous membranes are moist.     Pharynx: Oropharynx is clear.  Eyes:     General: No scleral icterus.       Right eye: No discharge.        Left eye: No discharge.     Extraocular Movements: Extraocular movements intact.     Conjunctiva/sclera: Conjunctivae normal.     Pupils: Pupils are equal, round, and reactive to light.  Cardiovascular:     Rate and Rhythm: Normal rate and regular rhythm.     Pulses: Normal pulses.     Heart sounds: Normal heart sounds. No murmur heard.    No friction rub. No gallop.  Pulmonary:     Effort: Pulmonary effort is normal. No respiratory distress.     Breath sounds: Normal breath sounds. No stridor. No  wheezing, rhonchi or rales.  Chest:     Chest wall: No tenderness.  Musculoskeletal:        General: Normal range of motion.     Cervical back: Normal range of motion and neck supple.  Skin:    General: Skin is warm and dry.     Capillary Refill: Capillary refill takes less than 2 seconds.     Coloration: Skin is not jaundiced or pale.     Findings: No bruising, erythema, lesion or rash.  Neurological:     General: No focal deficit present.     Mental Status: He is alert and oriented to person, place, and time. Mental status is at  baseline.  Psychiatric:        Mood and Affect: Mood normal.        Behavior: Behavior normal.        Thought Content: Thought content normal.        Judgment: Judgment normal.     Results for orders placed or performed in visit on 09/05/22  Comprehensive metabolic panel  Result Value Ref Range   Glucose 130 (H) 70 - 99 mg/dL   BUN 22 8 - 27 mg/dL   Creatinine, Ser 4.78 (H) 0.76 - 1.27 mg/dL   eGFR 52 (L) >29 FA/OZH/0.86   BUN/Creatinine Ratio 15 10 - 24   Sodium 139 134 - 144 mmol/L   Potassium 4.6 3.5 - 5.2 mmol/L   Chloride 102 96 - 106 mmol/L   CO2 22 20 - 29 mmol/L   Calcium 9.5 8.6 - 10.2 mg/dL   Total Protein 6.9 6.0 - 8.5 g/dL   Albumin 4.3 3.9 - 4.9 g/dL   Globulin, Total 2.6 1.5 - 4.5 g/dL   Albumin/Globulin Ratio 1.7 1.2 - 2.2   Bilirubin Total 0.5 0.0 - 1.2 mg/dL   Alkaline Phosphatase 128 (H) 44 - 121 IU/L   AST 41 (H) 0 - 40 IU/L   ALT 24 0 - 44 IU/L  CBC with Differential/Platelet  Result Value Ref Range   WBC 6.2 3.4 - 10.8 x10E3/uL   RBC 5.55 4.14 - 5.80 x10E6/uL   Hemoglobin 14.0 13.0 - 17.7 g/dL   Hematocrit 57.8 46.9 - 51.0 %   MCV 79 79 - 97 fL   MCH 25.2 (L) 26.6 - 33.0 pg   MCHC 32.0 31.5 - 35.7 g/dL   RDW 62.9 52.8 - 41.3 %   Platelets 193 150 - 450 x10E3/uL   Neutrophils 42 Not Estab. %   Lymphs 37 Not Estab. %   Monocytes 9 Not Estab. %   Eos 10 Not Estab. %   Basos 1 Not Estab. %   Neutrophils Absolute 2.6 1.4 - 7.0 x10E3/uL   Lymphocytes Absolute 2.3 0.7 - 3.1 x10E3/uL   Monocytes Absolute 0.6 0.1 - 0.9 x10E3/uL   EOS (ABSOLUTE) 0.6 (H) 0.0 - 0.4 x10E3/uL   Basophils Absolute 0.1 0.0 - 0.2 x10E3/uL   Immature Granulocytes 1 Not Estab. %   Immature Grans (Abs) 0.0 0.0 - 0.1 x10E3/uL  Lipid Panel w/o Chol/HDL Ratio  Result Value Ref Range   Cholesterol, Total 175 100 - 199 mg/dL   Triglycerides 62 0 - 149 mg/dL   HDL 69 >24 mg/dL   VLDL Cholesterol Cal 12 5 - 40 mg/dL   LDL Chol Calc (NIH) 94 0 - 99 mg/dL  PSA  Result Value Ref  Range   Prostate Specific Ag, Serum 0.5 0.0 - 4.0 ng/mL  TSH  Result  Value Ref Range   TSH 1.160 0.450 - 4.500 uIU/mL  Urinalysis, Routine w reflex microscopic  Result Value Ref Range   Specific Gravity, UA 1.020 1.005 - 1.030   pH, UA 5.0 5.0 - 7.5   Color, UA Yellow Yellow   Appearance Ur Clear Clear   Leukocytes,UA Negative Negative   Protein,UA Negative Negative/Trace   Glucose, UA Negative Negative   Ketones, UA Negative Negative   RBC, UA Negative Negative   Bilirubin, UA Negative Negative   Urobilinogen, Ur 0.2 0.2 - 1.0 mg/dL   Nitrite, UA Negative Negative   Microscopic Examination Comment   Microalbumin, Urine Waived  Result Value Ref Range   Microalb, Ur Waived 30 (H) 0 - 19 mg/L   Creatinine, Urine Waived 200 10 - 300 mg/dL   Microalb/Creat Ratio <30 <30 mg/g      Assessment & Plan:   Problem List Items Addressed This Visit       Cardiovascular and Mediastinum   CAD (coronary artery disease)    Will keep BP and cholesterol under good control. Continue to monitor. Call with any concerns.       Relevant Medications   lisinopril (ZESTRIL) 40 MG tablet   metoprolol tartrate (LOPRESSOR) 25 MG tablet   simvastatin (ZOCOR) 40 MG tablet   tadalafil (CIALIS) 20 MG tablet   Other Relevant Orders   Comprehensive metabolic panel   Lipid Panel w/o Chol/HDL Ratio     Respiratory   COPD (chronic obstructive pulmonary disease) (HCC)    Doing well not on medicine. Continue to monitor. Call with any concerns.       Relevant Medications   albuterol (VENTOLIN HFA) 108 (90 Base) MCG/ACT inhaler   Other Relevant Orders   CBC with Differential/Platelet   Comprehensive metabolic panel     Genitourinary   Benign hypertensive renal disease - Primary    Under good control on current regimen. Continue current regimen. Continue to monitor. Call with any concerns. Refills given.  Labs drawn today.       Relevant Orders   Comprehensive metabolic panel     Other   HLD  (hyperlipidemia)    Under good control on current regimen. Continue current regimen. Continue to monitor. Call with any concerns. Refills given.  Labs drawn today.       Relevant Medications   lisinopril (ZESTRIL) 40 MG tablet   metoprolol tartrate (LOPRESSOR) 25 MG tablet   simvastatin (ZOCOR) 40 MG tablet   tadalafil (CIALIS) 20 MG tablet   Other Relevant Orders   Comprehensive metabolic panel   Lipid Panel w/o Chol/HDL Ratio   Urinary hesitancy due to benign prostatic hyperplasia    Under good control on current regimen. Continue current regimen. Continue to monitor. Call with any concerns. Refills given.  Labs drawn today.       Relevant Orders   Comprehensive metabolic panel   PSA   Other Visit Diagnoses     Flu vaccine need       Relevant Orders   Flu Vaccine Trivalent High Dose (Fluad) (Completed)   Need for COVID-19 vaccine       Relevant Orders   Pfizer Comirnaty Covid -19 Vaccine 36yrs and older (Completed)        Follow up plan: Return in about 6 months (around 09/05/2023) for physical.

## 2023-03-07 NOTE — Assessment & Plan Note (Signed)
Doing well not on medicine. Continue to monitor. Call with any concerns.

## 2023-03-07 NOTE — Assessment & Plan Note (Signed)
Will keep BP and cholesterol under good control. Continue to monitor. Call with any concerns.  

## 2023-03-07 NOTE — Assessment & Plan Note (Signed)
Under good control on current regimen. Continue current regimen. Continue to monitor. Call with any concerns. Refills given. Labs drawn today.   

## 2023-03-08 LAB — COMPREHENSIVE METABOLIC PANEL
ALT: 21 [IU]/L (ref 0–44)
AST: 40 [IU]/L (ref 0–40)
Albumin: 4.2 g/dL (ref 3.9–4.9)
Alkaline Phosphatase: 140 [IU]/L — ABNORMAL HIGH (ref 44–121)
BUN/Creatinine Ratio: 20 (ref 10–24)
BUN: 29 mg/dL — ABNORMAL HIGH (ref 8–27)
Bilirubin Total: 0.4 mg/dL (ref 0.0–1.2)
CO2: 19 mmol/L — ABNORMAL LOW (ref 20–29)
Calcium: 9.8 mg/dL (ref 8.6–10.2)
Chloride: 102 mmol/L (ref 96–106)
Creatinine, Ser: 1.44 mg/dL — ABNORMAL HIGH (ref 0.76–1.27)
Globulin, Total: 2.7 g/dL (ref 1.5–4.5)
Glucose: 136 mg/dL — ABNORMAL HIGH (ref 70–99)
Potassium: 4.8 mmol/L (ref 3.5–5.2)
Sodium: 138 mmol/L (ref 134–144)
Total Protein: 6.9 g/dL (ref 6.0–8.5)
eGFR: 53 mL/min/{1.73_m2} — ABNORMAL LOW (ref >59)

## 2023-03-08 LAB — CBC WITH DIFFERENTIAL/PLATELET
Basophils Absolute: 0.1 10*3/uL (ref 0.0–0.2)
Basos: 1 %
EOS (ABSOLUTE): 0.5 10*3/uL — ABNORMAL HIGH (ref 0.0–0.4)
Eos: 7 %
Hematocrit: 43.2 % (ref 37.5–51.0)
Hemoglobin: 13.5 g/dL (ref 13.0–17.7)
Immature Grans (Abs): 0 10*3/uL (ref 0.0–0.1)
Immature Granulocytes: 1 %
Lymphocytes Absolute: 2.4 10*3/uL (ref 0.7–3.1)
Lymphs: 38 %
MCH: 25.2 pg — ABNORMAL LOW (ref 26.6–33.0)
MCHC: 31.3 g/dL — ABNORMAL LOW (ref 31.5–35.7)
MCV: 81 fL (ref 79–97)
Monocytes Absolute: 0.6 10*3/uL (ref 0.1–0.9)
Monocytes: 10 %
Neutrophils Absolute: 2.7 10*3/uL (ref 1.4–7.0)
Neutrophils: 43 %
Platelets: 223 10*3/uL (ref 150–450)
RBC: 5.36 x10E6/uL (ref 4.14–5.80)
RDW: 13.4 % (ref 11.6–15.4)
WBC: 6.2 10*3/uL (ref 3.4–10.8)

## 2023-03-08 LAB — LIPID PANEL W/O CHOL/HDL RATIO
Cholesterol, Total: 179 mg/dL (ref 100–199)
HDL: 58 mg/dL (ref >39)
LDL Chol Calc (NIH): 106 mg/dL — ABNORMAL HIGH (ref 0–99)
Triglycerides: 83 mg/dL (ref 0–149)
VLDL Cholesterol Cal: 15 mg/dL (ref 5–40)

## 2023-03-08 LAB — PSA: Prostate Specific Ag, Serum: 0.4 ng/mL (ref 0.0–4.0)

## 2023-03-10 ENCOUNTER — Encounter: Payer: Self-pay | Admitting: Family Medicine

## 2023-04-15 ENCOUNTER — Other Ambulatory Visit: Payer: Self-pay | Admitting: Family Medicine

## 2023-04-15 NOTE — Telephone Encounter (Signed)
Medication Refill -  Most Recent Primary Care Visit:  Provider: Dorcas Carrow  Department: [facility]  Visit Type: OFFICE VISIT  Date: 03/07/2023  Medication:  albuterol (VENTOLIN HFA) 108 (90 Base) MCG/ACT inhaler  Has the patient contacted their pharmacy? Pam with [facility] is calling to request this prescription.  Is this the correct pharmacy for this prescription? Yes, the one listed below.  This is the patient's preferred pharmacy:  [facility] - [address] [place] [address] Phone: [phone] Fax: [phone]   Has the prescription been filled recently?  Receipt confirmed by pharmacy (03/07/2023 10:35 AM EDT)   Is the patient out of the medication? Pam with [facility] does not know  Has the patient been seen for an appointment in the last year OR does the patient have an upcoming appointment? Patient has a CPE scheduled for 4.21.2025 with PCP

## 2023-04-16 MED ORDER — ALBUTEROL SULFATE HFA 108 (90 BASE) MCG/ACT IN AERS: INHALATION_SPRAY | RESPIRATORY_TRACT | 6 refills | Status: DC

## 2023-04-16 NOTE — Telephone Encounter (Signed)
Requested Prescriptions  Pending Prescriptions Disp Refills   albuterol (VENTOLIN HFA) 108 (90 Base) MCG/ACT inhaler 18 g 6    Sig: INHALE 2 PUFFS INTO THE LUNGS EVERY 6  HOURS AS NEEDED FOR WHEEZING OR SHORTNESS OF BREATH.     Pulmonology:  Beta Agonists 2 Passed - 04/15/2023  1:22 PM      Passed - Last BP in normal range    BP Readings from Last 1 Encounters:  03/07/23 104/62         Passed - Last Heart Rate in normal range    Pulse Readings from Last 1 Encounters:  03/07/23 (!) 50         Passed - Valid encounter within last 12 months    Recent Outpatient Visits           1 month ago Benign hypertensive renal disease   [facility] [place], Megan P, DO   7 months ago Routine general medical examination at a health care facility   [facility] [place], [place] P, DO   1 year ago Benign hypertensive renal disease   [facility] [place] Dor[facility] Carrow, DO   1 year ago Routine general medical examination at a health care facility   [facility] [place], [place] P, DO   2 years ago Benign hypertensive renal disease   [facility] Dor[facility] Carrow, DO       Future Appointments             In 4 months Laural Benes, Oralia Rud, DO [facility]

## 2023-04-21 ENCOUNTER — Other Ambulatory Visit: Payer: Self-pay | Admitting: Family Medicine

## 2023-04-23 NOTE — Telephone Encounter (Signed)
Duplicate request, Rx reordered 04/16/23  Requested Prescriptions  Pending Prescriptions Disp Refills   albuterol (VENTOLIN HFA) 108 (90 Base) MCG/ACT inhaler [Pharmacy Med Name: Ventolin HFA 90 mcg/actuation aerosol inhaler] 18 g 6    Sig: INHALE TWO PUFFS INTO LUNGS EVERY 6 HOURS AS NEEDED FOR WHEEZING OR FOR SHORTNESS OF BREATH     Pulmonology:  Beta Agonists 2 Passed - 04/21/2023  8:45 AM      Passed - Last BP in normal range    BP Readings from Last 1 Encounters:  03/07/23 104/62         Passed - Last Heart Rate in normal range    Pulse Readings from Last 1 Encounters:  03/07/23 (!) 50         Passed - Valid encounter within last 12 months    Recent Outpatient Visits           1 month ago Benign hypertensive renal disease   [facility] [place], Megan P, DO   7 months ago Routine general medical examination at a health care facility   [facility], [place] P, DO   1 year ago Benign hypertensive renal disease   [facility] Dorcas Carrow, DO   1 year ago Routine general medical examination at a health care facility   [facility] [place], [place] P, DO   2 years ago Benign hypertensive renal disease   [place] [facility] Dorcas Carrow, DO       Future Appointments             In 4 months Laural Benes, Oralia Rud, DO [place] [facility]

## 2023-04-25 ENCOUNTER — Other Ambulatory Visit: Payer: Self-pay | Admitting: Family Medicine

## 2023-04-28 NOTE — Telephone Encounter (Signed)
Requested Prescriptions  Refused Prescriptions Disp Refills   simvastatin (ZOCOR) 40 MG tablet [Pharmacy Med Name: simvastatin 40 mg tablet] 90 tablet 11    Sig: TAKE ONE TABLET (40MG  TOTAL) BY MOUTH DAILY     Cardiovascular:  Antilipid - Statins Failed - 04/25/2023  5:52 AM      Failed - Lipid Panel in normal range within the last 12 months    Cholesterol, Total  Date Value Ref Range Status  03/07/2023 179 100 - 199 mg/dL Final   Cholesterol Piccolo, Waived  Date Value Ref Range Status  04/02/2016 142 <200 mg/dL Final    Comment:                            Desirable                <200                         Borderline High      200- 239                         High                     >239    LDL Chol Calc (NIH)  Date Value Ref Range Status  03/07/2023 106 (H) 0 - 99 mg/dL Final   HDL  Date Value Ref Range Status  03/07/2023 58 >39 mg/dL Final   Triglycerides  Date Value Ref Range Status  03/07/2023 83 0 - 149 mg/dL Final   Triglycerides Piccolo,Waived  Date Value Ref Range Status  04/02/2016 53 <150 mg/dL Final    Comment:                            Normal                   <150                         Borderline High     150 - 199                         High                200 - 499                         Very High                >499          Passed - Patient is not pregnant      Passed - Valid encounter within last 12 months    Recent Outpatient Visits           1 month ago Benign hypertensive renal disease   [facility] [place], Megan P, DO   7 months ago Routine general medical examination at a health care facility   [facility] [place], [place] P, DO   1 year ago Benign hypertensive renal disease   [place] [facility] Dorcas Carrow, DO   1 year ago Routine general medical examination at a health care facility   [facility] [place], [place]  P, DO   2 years  ago Benign hypertensive renal disease   [place] [facility] [place], [place], DO       Future Appointments             In 4 months Laural Benes, Oralia Rud, DO [place] [facility]             tamsulosin (FLOMAX) 0.4 MG CAPS capsule [Pharmacy Med Name: tamsulosin 0.4 mg capsule] 90 capsule 11    Sig: TAKE ONE CAPSULE (0.4MG  TOTAL) BY MOUTH DAILY     Urology: Alpha-Adrenergic Blocker Passed - 04/25/2023  5:52 AM      Passed - PSA in normal range and within 360 days    Prostate Specific Ag, Serum  Date Value Ref Range Status  03/07/2023 0.4 0.0 - 4.0 ng/mL Final    Comment:    Roche ECLIA methodology. According to the American Urological Association, Serum PSA should decrease and remain at undetectable levels after radical prostatectomy. The AUA defines biochemical recurrence as an initial PSA value 0.2 ng/mL or greater followed by a subsequent confirmatory PSA value 0.2 ng/mL or greater. Values obtained with different assay methods or kits cannot be used interchangeably. Results cannot be interpreted as absolute evidence of the presence or absence of malignant disease.          Passed - Last BP in normal range    BP Readings from Last 1 Encounters:  03/07/23 104/62         Passed - Valid encounter within last 12 months    Recent Outpatient Visits           1 month ago Benign hypertensive renal disease   [place] [facility] [place], Megan P, DO   7 months ago Routine general medical examination at a health care facility   [facility] [place], [place] P, DO   1 year ago Benign hypertensive renal disease   [facility], DO   1 year ago Routine general medical examination at a health care facility   [facility] [place], [place] P, DO   2 years ago Benign hypertensive renal disease   [place] [place], [place],  DO       Future Appointments             In 4 months Johnson, Megan P, DO [place] [facility]             metoprolol tartrate (LOPRESSOR) 25 MG tablet [Pharmacy Med Name: metoprolol tartrate 25 mg tablet] 180 tablet 11    Sig: TAKE ONE TABLET (25MG  TOTAL) BY MOUTH TWICE DAILY     Cardiovascular:  Beta Blockers Passed - 04/25/2023  5:52 AM      Passed - Last BP in normal range    BP Readings from Last 1 Encounters:  03/07/23 104/62         Passed - Last Heart Rate in normal range    Pulse Readings from Last 1 Encounters:  03/07/23 (!) 50         Passed - Valid encounter within last 6 months    Recent Outpatient Visits           1 month ago Benign hypertensive renal disease   [facility] [place], [place], DO   7 months ago Routine general medical examination at a health care facility  [facility] [place], [place] P, DO   1 year ago Benign hypertensive renal disease   [place] [place], Megan P, DO   1 year ago Routine general medical examination at a health care facility   [facility] [place], [place] P, DO   2 years ago Benign hypertensive renal disease   [facility] Carrow, DO       Future Appointments             In 4 months Laural Benes, Oralia Rud, DO [place] [facility]             lisinopril (ZESTRIL) 40 MG tablet [Pharmacy Med Name: lisinopril 40 mg tablet] 90 tablet 11    Sig: TAKE ONE TABLET (40MG  TOTAL) BY MOUTH DAILY     Cardiovascular:  ACE Inhibitors Failed - 04/25/2023  5:52 AM      Failed - Cr in normal range and within 180 days    Creatinine, Ser  Date Value Ref Range Status  03/07/2023 1.44 (H) 0.76 - 1.27 mg/dL Final         Passed - K in normal range and within 180 days    Potassium  Date Value Ref Range Status  03/07/2023 4.8 3.5 - 5.2 mmol/L Final          Passed - Patient is not pregnant      Passed - Last BP in normal range    BP Readings from Last 1 Encounters:  03/07/23 104/62         Passed - Valid encounter within last 6 months    Recent Outpatient Visits           1 month ago Benign hypertensive renal disease   [place] [facility] [place], Megan P, DO   7 months ago Routine general medical examination at a health care facility   [facility] [place], [place] P, DO   1 year ago Benign hypertensive renal disease   [place] [facility], DO   1 year ago Routine general medical examination at a health care facility   [facility] [place], [place] P, DO   2 years ago Benign hypertensive renal disease   [place] C[facility], DO       Future Appointments             In 4 months Laural Benes, Oralia Rud, DO [place] [facility]

## 2023-04-29 ENCOUNTER — Other Ambulatory Visit: Payer: Self-pay | Admitting: Family Medicine

## 2023-04-29 NOTE — Telephone Encounter (Signed)
Medication Refill -  Most Recent Primary Care Visit:  Provider: Dorcas Carrow  Department: [facility]  Visit Type: OFFICE VISIT  Date: 03/07/2023  Medication: EPINEPHrine 0.3 mg/0.3 mL IJ SOAJ injection [[id number]  albuterol (VENTOLIN HFA) 108 (90 Base) MCG/ACT inhaler [[id number]  metoprolol tartrate (LOPRESSOR) 25 MG table  lisinopril (ZESTRIL) 40 MG tablet  simvastatin (ZOCOR) 40 MG tablet  tamsulosin (FLOMAX) 0.4 MG CAPS capsule  [facility] [place] [address] [place] [address] Phone: [phone] Fax: [phone]   Pharmacy calling and requesting meds to be refilled

## 2023-04-30 MED ORDER — SIMVASTATIN 40 MG PO TABS: 40.0000 mg | ORAL_TABLET | Freq: Every day | ORAL | 0 refills | Status: DC

## 2023-04-30 MED ORDER — METOPROLOL TARTRATE 25 MG PO TABS: 25.0000 mg | ORAL_TABLET | Freq: Two times a day (BID) | ORAL | 0 refills | Status: DC

## 2023-04-30 MED ORDER — LISINOPRIL 40 MG PO TABS: 40.0000 mg | ORAL_TABLET | Freq: Every day | ORAL | 0 refills | Status: DC

## 2023-04-30 MED ORDER — TAMSULOSIN HCL 0.4 MG PO CAPS: 0.4000 mg | ORAL_CAPSULE | Freq: Every day | ORAL | 0 refills | Status: DC

## 2023-04-30 MED ORDER — EPINEPHRINE 0.3 MG/0.3ML IJ SOAJ: 0.3000 mg | INTRAMUSCULAR | 3 refills | Status: AC | PRN

## 2023-04-30 NOTE — Telephone Encounter (Signed)
Call to [facility]- they only have Albuterol- other Rx were sent to [facility] and will need to be forwarded. Remainder of Rx forwarded to new mail order pharmacy. Requested Prescriptions  Pending Prescriptions Disp Refills   albuterol (VENTOLIN HFA) 108 (90 Base) MCG/ACT inhaler 18 g 6    Sig: INHALE 2 PUFFS INTO THE LUNGS EVERY 6  HOURS AS NEEDED FOR WHEEZING OR SHORTNESS OF BREATH.     Pulmonology:  Beta Agonists 2 Passed - 04/29/2023  5:54 PM      Passed - Last BP in normal range    BP Readings from Last 1 Encounters:  03/07/23 104/62         Passed - Last Heart Rate in normal range    Pulse Readings from Last 1 Encounters:  03/07/23 (!) 50         Passed - Valid encounter within last 12 months    Recent Outpatient Visits           1 month ago Benign hypertensive renal disease   [facility] [place], Megan P, DO   7 months ago Routine general medical examination at a health care facility   [facility] [place], [place] P, DO   1 year ago Benign hypertensive renal disease   [place] [facility] [place], Megan P, DO   1 year ago Routine general medical examination at a health care facility   [facility] [place], [place] P, DO   2 years ago Benign hypertensive renal disease   [place] [place], [place], DO       Future Appointments             In 4 months Johnson, Oralia Rud, DO [place] [facility]             EPINEPHrine 0.3 mg/0.3 mL IJ SOAJ injection 2 each 3    Sig: Inject 0.3 mg into the muscle as needed for anaphylaxis.     Immunology: Antidotes Passed - 04/29/2023  5:54 PM      Passed - Valid encounter within last 12 months    Recent Outpatient Visits           1 month ago Benign hypertensive renal disease   [place] [facility] [place], Megan P, DO   7 months ago Routine general medical examination at a  health care facility   [facility] [place], [place] P, DO   1 year ago Benign hypertensive renal disease   [place] [facility] C[facility], DO   1 year ago Routine general medical examination at a health care facility   [facility], [place] P, DO   2 years ago Benign hypertensive renal disease   [place] [facility] [place], Oralia Rud, DO       Future Appointments             In 4 months Johnson, Oralia Rud, DO [place] [facility]             lisinopril (ZESTRIL) 40 MG tablet 90 tablet 1    Sig: Take 1 tablet (40 mg total) by mouth daily.     Cardiovascular:  ACE Inhibitors Failed - 04/29/2023  5:54 PM      Failed - Cr in normal range and within 180 days    Creatinine, Ser  Date  Value Ref Range Status  03/07/2023 1.44 (H) 0.76 - 1.27 mg/dL Final         Passed - K in normal range and within 180 days    Potassium  Date Value Ref Range Status  03/07/2023 4.8 3.5 - 5.2 mmol/L Final         Passed - Patient is not pregnant      Passed - Last BP in normal range    BP Readings from Last 1 Encounters:  03/07/23 104/62         Passed - Valid encounter within last 6 months    Recent Outpatient Visits           1 month ago Benign hypertensive renal disease   [place] [facility] [place], Megan P, DO   7 months ago Routine general medical examination at a health care facility   [facility] [place], [place] P, DO   1 year ago Benign hypertensive renal disease   [place] [facility] [place], Megan P, DO   1 year ago Routine general medical examination at a health care facility   [facility] [place], [place] P, DO   2 years ago Benign hypertensive renal disease   [facility] [place], Oralia Rud, DO       Future Appointments             In 4 months Johnson,  Oralia Rud, DO [place] [facility]             metoprolol tartrate (LOPRESSOR) 25 MG tablet 180 tablet 1    Sig: Take 1 tablet (25 mg total) by mouth 2 (two) times daily.     Cardiovascular:  Beta Blockers Passed - 04/29/2023  5:54 PM      Passed - Last BP in normal range    BP Readings from Last 1 Encounters:  03/07/23 104/62         Passed - Last Heart Rate in normal range    Pulse Readings from Last 1 Encounters:  03/07/23 (!) 50         Passed - Valid encounter within last 6 months    Recent Outpatient Visits           1 month ago Benign hypertensive renal disease   [place] [facility] [place], Megan P, DO   7 months ago Routine general medical examination at a health care facility   [facility] [place], [place] P, DO   1 year ago Benign hypertensive renal disease   [place] [facility] Carrow, DO   1 year ago Routine general medical examination at a health care facility   [facility], [place] P, DO   2 years ago Benign hypertensive renal disease   [place] [place], Megan P, DO       Future Appointments             In 4 months Johnson, Oralia Rud, DO [place] [facility]             simvastatin (ZOCOR) 40 MG tablet 90 tablet 1    Sig: Take 1 tablet (40 mg total) by mouth daily.     Cardiovascular:  Antilipid - Statins Failed - 04/29/2023  5:54 PM      Failed - Lipid Panel in normal range within the  last 12 months    Cholesterol, Total  Date Value Ref Range Status  03/07/2023 179 100 - 199 mg/dL Final   Cholesterol Piccolo, Waived  Date Value Ref Range Status  04/02/2016 142 <200 mg/dL Final    Comment:                            Desirable                <200                         Borderline High      200- 239                         High                     >239    LDL Chol Calc (NIH)   Date Value Ref Range Status  03/07/2023 106 (H) 0 - 99 mg/dL Final   HDL  Date Value Ref Range Status  03/07/2023 58 >39 mg/dL Final   Triglycerides  Date Value Ref Range Status  03/07/2023 83 0 - 149 mg/dL Final   Triglycerides Piccolo,Waived  Date Value Ref Range Status  04/02/2016 53 <150 mg/dL Final    Comment:                            Normal                   <150                         Borderline High     150 - 199                         High                200 - 499                         Very High                >499          Passed - Patient is not pregnant      Passed - Valid encounter within last 12 months    Recent Outpatient Visits           1 month ago Benign hypertensive renal disease   [facility] [place], Megan P, DO   7 months ago Routine general medical examination at a health care facility   [facility] [place], [place] P, DO   1 year ago Benign hypertensive renal disease   [place] [facility] Dor[facility] Carrow, DO   1 year ago Routine general medical examination at a health care facility   [facility] [place], [place] P, DO   2 years ago Benign hypertensive renal disease   [place] C[facility], DO       Future Appointments             In 4 months Laural Benes, Oralia Rud, DO [place] [facility]  tamsulosin (FLOMAX) 0.4 MG CAPS capsule 90 capsule 1    Sig: Take 1 capsule (0.4 mg total) by mouth daily.     There is no refill protocol information for this order

## 2023-05-07 ENCOUNTER — Other Ambulatory Visit: Payer: Self-pay | Admitting: Family Medicine

## 2023-05-07 NOTE — Telephone Encounter (Signed)
Rx 04/30/23  lisinopril 04/30/23 #90                     Metoprolol 04/30/23 #180 Requested Prescriptions  Pending Prescriptions Disp Refills   lisinopril (ZESTRIL) 40 MG tablet 90 tablet 0    Sig: Take 1 tablet (40 mg total) by mouth daily.     Cardiovascular:  ACE Inhibitors Failed - 05/07/2023  2:24 PM      Failed - Cr in normal range and within 180 days    Creatinine, Ser  Date Value Ref Range Status  03/07/2023 1.44 (H) 0.76 - 1.27 mg/dL Final         Passed - K in normal range and within 180 days    Potassium  Date Value Ref Range Status  03/07/2023 4.8 3.5 - 5.2 mmol/L Final         Passed - Patient is not pregnant      Passed - Last BP in normal range    BP Readings from Last 1 Encounters:  03/07/23 104/62         Passed - Valid encounter within last 6 months    Recent Outpatient Visits           2 months ago Benign hypertensive renal disease   [place] [facility] [place], Megan P, DO   8 months ago Routine general medical examination at a health care facility   [facility] [place], [place] P, DO   1 year ago Benign hypertensive renal disease   [place] [place], Megan P, DO   1 year ago Routine general medical examination at a health care facility   [facility] [place], [place] P, DO   2 years ago Benign hypertensive renal disease   [place] [facility] [place], Oralia Rud, DO       Future Appointments             In 4 months Johnson, Oralia Rud, DO [place] [facility]             metoprolol tartrate (LOPRESSOR) 25 MG tablet 180 tablet 0    Sig: Take 1 tablet (25 mg total) by mouth 2 (two) times daily.     Cardiovascular:  Beta Blockers Passed - 05/07/2023  2:24 PM      Passed - Last BP in normal range    BP Readings from Last 1 Encounters:  03/07/23 104/62         Passed - Last Heart Rate in normal range    Pulse Readings  from Last 1 Encounters:  03/07/23 (!) 50         Passed - Valid encounter within last 6 months    Recent Outpatient Visits           2 months ago Benign hypertensive renal disease   [place] [facility] [place], Megan P, DO   8 months ago Routine general medical examination at a health care facility   [facility] [place], [place] P, DO   1 year ago Benign hypertensive renal disease   [place] [facility] C[facility], DO   1 year ago Routine general medical examination at a health care facility   [facility] [place], [place] P, DO   2 years ago Benign hypertensive renal disease   [place] [facility] [place], Megan P, DO  Future Appointments             In 4 months Laural Benes, Oralia Rud, DO [facility]

## 2023-05-07 NOTE — Telephone Encounter (Signed)
Medication Refill -  Most Recent Primary Care Visit:  Provider: Dorcas Carrow  Department: [facility]  Visit Type: OFFICE VISIT  Date: 03/07/2023  Medication: lisinopril (ZESTRIL) 40 MG tablet [[id number]] metoprolol tartrate (LOPRESSOR) 25 MG tablet [[id number]]   Has the patient contacted their pharmacy? Yes  (Agent: If yes, when and what did the pharmacy advise?) Pharmacy called in for the refill   Is this the correct pharmacy for this prescription? Yes  This is the patient's preferred pharmacy:   [facility] [place] [address] [place] [address] Phone: [phone] Fax: [phone]   Has the prescription been filled recently? Yes  Is the patient out of the medication? Yes  Has the patient been seen for an appointment in the last year OR does the patient have an upcoming appointment? Yes  Can we respond through MyChart? No  Agent: Please be advised that Rx refills may take up to 3 business days. We ask that you follow-up with your pharmacy.

## 2023-07-21 ENCOUNTER — Other Ambulatory Visit: Payer: Self-pay | Admitting: Family Medicine

## 2023-07-22 NOTE — Telephone Encounter (Signed)
 Requested Prescriptions  Pending Prescriptions Disp Refills   lisinopril (ZESTRIL) 40 MG tablet [Pharmacy Med Name: lisinopril 40 mg tablet] 90 tablet 0    Sig: TAKE ONE TABLET (40 MG TOTAL) BY MOUTH ONCE DAILY     Cardiovascular:  ACE Inhibitors Failed - 07/22/2023 11:38 AM      Failed - Cr in normal range and within 180 days    Creatinine, Ser  Date Value Ref Range Status  03/07/2023 1.44 (H) 0.76 - 1.27 mg/dL Final         Passed - K in normal range and within 180 days    Potassium  Date Value Ref Range Status  03/07/2023 4.8 3.5 - 5.2 mmol/L Final         Passed - Patient is not pregnant      Passed - Last BP in normal range    BP Readings from Last 1 Encounters:  03/07/23 104/62         Passed - Valid encounter within last 6 months    Recent Outpatient Visits           4 months ago Benign hypertensive renal disease   [place] [facility] [place], Megan P, DO   10 months ago Routine general medical examination at a health care facility   [facility] [place], [place] P, DO   1 year ago Benign hypertensive renal disease   [place] [facility] [place], Megan P, DO   1 year ago Routine general medical examination at a health care facility   [facility] [place], [place] P, DO   2 years ago Benign hypertensive renal disease   [place] [facility] Dorcas Carrow, DO       Future Appointments             In 1 month Johnson, Oralia Rud, DO [place] [facility]             tamsulosin (FLOMAX) 0.4 MG CAPS capsule [Pharmacy Med Name: tamsulosin 0.4 mg capsule] 90 capsule 0    Sig: TAKE ONE CAPSULE (0.4 MG TOTAL) BY MOUTH ONCE DAILY     Urology: Alpha-Adrenergic Blocker Passed - 07/22/2023 11:38 AM      Passed - PSA in normal range and within 360 days    Prostate Specific Ag, Serum  Date Value Ref Range Status  03/07/2023 0.4 0.0 - 4.0 ng/mL Final     Comment:    Roche ECLIA methodology. According to the American Urological Association, Serum PSA should decrease and remain at undetectable levels after radical prostatectomy. The AUA defines biochemical recurrence as an initial PSA value 0.2 ng/mL or greater followed by a subsequent confirmatory PSA value 0.2 ng/mL or greater. Values obtained with different assay methods or kits cannot be used interchangeably. Results cannot be interpreted as absolute evidence of the presence or absence of malignant disease.          Passed - Last BP in normal range    BP Readings from Last 1 Encounters:  03/07/23 104/62         Passed - Valid encounter within last 12 months    Recent Outpatient Visits           4 months ago Benign hypertensive renal disease   [place] [facility] [place], Megan P, DO   10 months ago Routine general medical examination at a health care facility   [facility] [place] Megan P, DO   1 year ago Benign hypertensive renal disease   [place] [facility] Dorcas Carrow, DO   1 year ago Routine general medical examination at a health care facility   [facility] [place], [place] P, DO   2 years ago Benign hypertensive renal disease   [place] Carrow, DO       Future Appointments             In 1 month Johnson, Megan P, DO [place] [facility]             simvastatin (ZOCOR) 40 MG tablet [Pharmacy Med Name: simvastatin 40 mg tablet] 90 tablet 0    Sig: TAKE ONE TABLET (40 MG TOTAL) BY MOUTH ONCE DAILY     Cardiovascular:  Antilipid - Statins Failed - 07/22/2023 11:38 AM      Failed - Lipid Panel in normal range within the last 12 months    Cholesterol, Total  Date Value Ref Range Status  03/07/2023 179 100 - 199 mg/dL Final   Cholesterol Piccolo, Waived  Date Value Ref Range Status  04/02/2016 142 <200 mg/dL Final     Comment:                            Desirable                <200                         Borderline High      200- 239                         High                     >239    LDL Chol Calc (NIH)  Date Value Ref Range Status  03/07/2023 106 (H) 0 - 99 mg/dL Final   HDL  Date Value Ref Range Status  03/07/2023 58 >39 mg/dL Final   Triglycerides  Date Value Ref Range Status  03/07/2023 83 0 - 149 mg/dL Final   Triglycerides Piccolo,Waived  Date Value Ref Range Status  04/02/2016 53 <150 mg/dL Final    Comment:                            Normal                   <150                         Borderline High     150 - 199                         High                200 - 499                         Very High                >499          Passed - Patient is not pregnant  Passed - Valid encounter within last 12 months    Recent Outpatient Visits           4 months ago Benign hypertensive renal disease   [place] [facility] [place], Megan P, DO   10 months ago Routine general medical examination at a health care facility   [facility] [place], [place] P, DO   1 year ago Benign hypertensive renal disease   [place] [facility] Dorcas Carrow, DO   1 year ago Routine general medical examination at a health care facility   [facility] [place], [place] P, DO   2 years ago Benign hypertensive renal disease   [place] [facility] [place], Oralia Rud, DO       Future Appointments             In 1 month Johnson, Megan P, DO [place] [facility]             metoprolol tartrate (LOPRESSOR) 25 MG tablet [Pharmacy Med Name: metoprolol tartrate 25 mg tablet] 180 tablet 0    Sig: TAKE ONE TABLET (25 MG TOTAL) BY MOUTH TWICE DAILY     Cardiovascular:  Beta Blockers Passed - 07/22/2023 11:38 AM      Passed - Last BP in normal range    BP Readings from Last  1 Encounters:  03/07/23 104/62         Passed - Last Heart Rate in normal range    Pulse Readings from Last 1 Encounters:  03/07/23 (!) 50         Passed - Valid encounter within last 6 months    Recent Outpatient Visits           4 months ago Benign hypertensive renal disease   [place] [facility] [place], Megan P, DO   10 months ago Routine general medical examination at a health care facility   [facility] [place], [place] P, DO   1 year ago Benign hypertensive renal disease   [place] [facility] Dorcas Carrow, DO   1 year ago Routine general medical examination at a health care facility   [facility] [place], [place] P, DO   2 years ago Benign hypertensive renal disease   [place] [facility] Dorcas Carrow, DO       Future Appointments             In 1 month Laural Benes, Oralia Rud, DO  [facility]

## 2023-07-24 ENCOUNTER — Other Ambulatory Visit: Payer: Self-pay | Admitting: Family Medicine

## 2023-07-24 NOTE — Telephone Encounter (Signed)
 Copied from CRM [id number]. Topic: Clinical - Medication Refill >> Jul 24, 2023  4:17 PM Gildardo Pounds wrote: Most Recent Primary Care Visit:  Provider: Olevia Perches P  Department: [id number]  Visit Type: OFFICE VISIT  Date: 03/07/2023  Medication: simvastatin (ZOCOR) 40 MG tablet lisinopril (ZESTRIL) 40 MG tablet  tamsulosin (FLOMAX) 0.4 MG CAPS capsule metoprolol tartrate (LOPRESSOR) 25 MG tablet  Has the patient contacted their pharmacy? Yes (Agent: If no, request that the patient contact the pharmacy for the refill. If patient does not wish to contact the pharmacy document the reason why and proceed with request.) (Agent: If yes, when and what did the pharmacy advise?)Pharmacy called in prescriptions.  Is this the correct pharmacy for this prescription? Yes If no, delete pharmacy and type the correct one.  This is the patient's preferred pharmacy:   [facility] [place] [place] [address] [place] [address] Phone: [phone] Fax: [phone]   Has the prescription been filled recently? No  Is the patient out of the medication? Yes  Has the patient been seen for an appointment in the last year OR does the patient have an upcoming appointment? Yes  Can we respond through MyChart? No  Agent: Please be advised that Rx refills may take up to 3 business days. We ask that you follow-up with your pharmacy.

## 2023-07-25 NOTE — Telephone Encounter (Signed)
 Duplicate request, last ordered 07/22/23 #90, 0 refills Requested Prescriptions  Pending Prescriptions Disp Refills   lisinopril (ZESTRIL) 40 MG tablet 90 tablet 0     Cardiovascular:  ACE Inhibitors Failed - 07/25/2023 11:35 AM      Failed - Cr in normal range and within 180 days    Creatinine, Ser  Date Value Ref Range Status  03/07/2023 1.44 (H) 0.76 - 1.27 mg/dL Final         Passed - K in normal range and within 180 days    Potassium  Date Value Ref Range Status  03/07/2023 4.8 3.5 - 5.2 mmol/L Final         Passed - Patient is not pregnant      Passed - Last BP in normal range    BP Readings from Last 1 Encounters:  03/07/23 104/62         Passed - Valid encounter within last 6 months    Recent Outpatient Visits           4 months ago Benign hypertensive renal disease   [place] [facility] [place], Megan P, DO   10 months ago Routine general medical examination at a health care facility   [facility] [place], [place] P, DO   1 year ago Benign hypertensive renal disease   [place] [facility] [place], Megan P, DO   1 year ago Routine general medical examination at a health care facility   [facility] [place], [place] P, DO   2 years ago Benign hypertensive renal disease   [place] [facility] Dorcas Carrow, DO       Future Appointments             In 1 month Johnson, Megan P, DO [place] [facility]             tamsulosin (FLOMAX) 0.4 MG CAPS capsule 90 capsule 0     Urology: Alpha-Adrenergic Blocker Passed - 07/25/2023 11:35 AM      Passed - PSA in normal range and within 360 days    Prostate Specific Ag, Serum  Date Value Ref Range Status  03/07/2023 0.4 0.0 - 4.0 ng/mL Final    Comment:    Roche ECLIA methodology. According to the American Urological Association, Serum PSA should decrease and remain at undetectable levels after  radical prostatectomy. The AUA defines biochemical recurrence as an initial PSA value 0.2 ng/mL or greater followed by a subsequent confirmatory PSA value 0.2 ng/mL or greater. Values obtained with different assay methods or kits cannot be used interchangeably. Results cannot be interpreted as absolute evidence of the presence or absence of malignant disease.          Passed - Last BP in normal range    BP Readings from Last 1 Encounters:  03/07/23 104/62         Passed - Valid encounter within last 12 months    Recent Outpatient Visits           4 months ago Benign hypertensive renal disease   [place] [facility] Megan P, DO   10 months ago Routine general medical examination at a health care facility   [facility] [place], [place] P, DO   1 year ago Benign hypertensive renal disease   [place] [facility] [place], Megan P, DO   1 year ago Routine general  medical examination at a health care facility   [facility] [place], [place] P, DO   2 years ago Benign hypertensive renal disease   [facility] Dorcas Carrow, DO       Future Appointments             In 1 month Johnson, Oralia Rud, DO [place] [facility]             simvastatin (ZOCOR) 40 MG tablet 90 tablet 0     Cardiovascular:  Antilipid - Statins Failed - 07/25/2023 11:35 AM      Failed - Lipid Panel in normal range within the last 12 months    Cholesterol, Total  Date Value Ref Range Status  03/07/2023 179 100 - 199 mg/dL Final   Cholesterol Piccolo, Waived  Date Value Ref Range Status  04/02/2016 142 <200 mg/dL Final    Comment:                            Desirable                <200                         Borderline High      200- 239                         High                     >239    LDL Chol Calc (NIH)  Date Value Ref Range Status  03/07/2023 106 (H) 0 -  99 mg/dL Final   HDL  Date Value Ref Range Status  03/07/2023 58 >39 mg/dL Final   Triglycerides  Date Value Ref Range Status  03/07/2023 83 0 - 149 mg/dL Final   Triglycerides Piccolo,Waived  Date Value Ref Range Status  04/02/2016 53 <150 mg/dL Final    Comment:                            Normal                   <150                         Borderline High     150 - 199                         High                200 - 499                         Very High                >499          Passed - Patient is not pregnant      Passed - Valid encounter within last 12 months    Recent Outpatient Visits           4 months ago Benign hypertensive renal disease   [place] [facility] [place], Megan P, DO   10 months ago Routine general medical examination at a health care  facility   [place] [facility] [place], [place] P, DO   1 year ago Benign hypertensive renal disease   [place] [facility] Dorcas Carrow, DO   1 year ago Routine general medical examination at a health care facility   [facility] [place], [place] P, DO   2 years ago Benign hypertensive renal disease   [place] [facility] [place], Oralia Rud, DO       Future Appointments             In 1 month Johnson, Megan P, DO [place] [facility]             metoprolol tartrate (LOPRESSOR) 25 MG tablet 180 tablet 0     Cardiovascular:  Beta Blockers Passed - 07/25/2023 11:35 AM      Passed - Last BP in normal range    BP Readings from Last 1 Encounters:  03/07/23 104/62         Passed - Last Heart Rate in normal range    Pulse Readings from Last 1 Encounters:  03/07/23 (!) 50         Passed - Valid encounter within last 6 months    Recent Outpatient Visits           4 months ago Benign hypertensive renal disease   [place] [facility] [facility] [place], Megan P, DO   10 months ago  Routine general medical examination at a health care facility   [facility] [place], [place] P, DO   1 year ago Benign hypertensive renal disease   [place] [facility] Dorcas Carrow, DO   1 year ago Routine general medical examination at a health care facility   [facility] [place], [place] P, DO   2 years ago Benign hypertensive renal disease   [place] [facility] Dorcas Carrow, DO       Future Appointments             In 1 month Laural Benes, Oralia Rud, DO [place] [facility]

## 2023-09-08 ENCOUNTER — Ambulatory Visit (INDEPENDENT_AMBULATORY_CARE_PROVIDER_SITE_OTHER): Payer: Self-pay | Admitting: Family Medicine

## 2023-09-08 ENCOUNTER — Encounter: Payer: Self-pay | Admitting: Family Medicine

## 2023-09-08 VITALS — BP 138/72 | HR 47 | Ht 69.0 in | Wt 211.6 lb

## 2023-09-08 DIAGNOSIS — Z0000 Encounter for general adult medical examination without abnormal findings: Secondary | ICD-10-CM | POA: Diagnosis not present

## 2023-09-08 DIAGNOSIS — J449 Chronic obstructive pulmonary disease, unspecified: Secondary | ICD-10-CM | POA: Diagnosis not present

## 2023-09-08 DIAGNOSIS — R3911 Hesitancy of micturition: Secondary | ICD-10-CM

## 2023-09-08 DIAGNOSIS — E782 Mixed hyperlipidemia: Secondary | ICD-10-CM

## 2023-09-08 DIAGNOSIS — Z87891 Personal history of nicotine dependence: Secondary | ICD-10-CM | POA: Diagnosis not present

## 2023-09-08 DIAGNOSIS — N401 Enlarged prostate with lower urinary tract symptoms: Secondary | ICD-10-CM | POA: Diagnosis not present

## 2023-09-08 DIAGNOSIS — I129 Hypertensive chronic kidney disease with stage 1 through stage 4 chronic kidney disease, or unspecified chronic kidney disease: Secondary | ICD-10-CM

## 2023-09-08 DIAGNOSIS — I2510 Atherosclerotic heart disease of native coronary artery without angina pectoris: Secondary | ICD-10-CM

## 2023-09-08 DIAGNOSIS — N183 Chronic kidney disease, stage 3 unspecified: Secondary | ICD-10-CM | POA: Diagnosis not present

## 2023-09-08 LAB — MICROALBUMIN, URINE WAIVED
Creatinine, Urine Waived: 200 mg/dL (ref 10–300)
Microalb, Ur Waived: 30 mg/L — ABNORMAL HIGH (ref 0–19)
Microalb/Creat Ratio: <30 mg/g (ref <30)

## 2023-09-08 MED ORDER — SIMVASTATIN 40 MG PO TABS: 40.0000 mg | ORAL_TABLET | Freq: Every day | ORAL | 1 refills | Status: DC

## 2023-09-08 MED ORDER — TAMSULOSIN HCL 0.4 MG PO CAPS: 0.4000 mg | ORAL_CAPSULE | Freq: Every day | ORAL | 1 refills | Status: DC

## 2023-09-08 MED ORDER — METOPROLOL TARTRATE 25 MG PO TABS: 25.0000 mg | ORAL_TABLET | Freq: Two times a day (BID) | ORAL | 1 refills | Status: DC

## 2023-09-08 MED ORDER — ALBUTEROL SULFATE HFA 108 (90 BASE) MCG/ACT IN AERS: INHALATION_SPRAY | RESPIRATORY_TRACT | 6 refills | Status: DC

## 2023-09-08 MED ORDER — LISINOPRIL 40 MG PO TABS: 40.0000 mg | ORAL_TABLET | Freq: Every day | ORAL | 1 refills | Status: DC

## 2023-09-08 NOTE — Assessment & Plan Note (Signed)
 Will keep BP and cholesterol under good control. Continue to monitor. Call with any concerns.

## 2023-09-08 NOTE — Assessment & Plan Note (Signed)
 Unsure if he wants to do another low dose CT- will think about it and let us  know.

## 2023-09-08 NOTE — Assessment & Plan Note (Signed)
 Under good control on current regimen. Continue current regimen. Continue to monitor. Call with any concerns. Refills given. Labs drawn today.

## 2023-09-08 NOTE — Progress Notes (Signed)
 BP 138/72 (BP Location: Left Arm, Patient Position: Sitting)   Pulse (!) 47   Ht 5\' 9"  (1.753 m)   Wt 211 lb 9.6 oz (96 kg)   SpO2 96%   BMI 31.25 kg/m    Subjective:    Patient ID: Mike Redden., male    DOB: 1955-10-26, 68 y.o.   MRN: [id number]  HPI: Mike Siska Grow Marieta Shorten. is a 68 y.o. male presenting on 09/08/2023 for comprehensive medical examination. Current medical complaints include:  HYPERTENSION / HYPERLIPIDEMIA Satisfied with current treatment? yes Duration of hypertension: chronic BP monitoring frequency: not checking BP medication side effects: no Past BP meds: lisinopril , metoprolol  Duration of hyperlipidemia: chronic Cholesterol medication side effects: no Cholesterol supplements: none Past cholesterol medications: simvastatin  Medication compliance: excellent compliance Aspirin: yes Recent stressors: no Recurrent headaches: no Visual changes: no Palpitations: no Dyspnea: no Chest pain: no Lower extremity edema: no Dizzy/lightheaded: no  BPH BPH status: controlled Satisfied with current treatment?: yes Medication side effects: no Medication compliance: excellent compliance Duration: chronic Nocturia: 1/night Urinary frequency:no Incomplete voiding: no Urgency: no Weak urinary stream: no Straining to start stream: no Dysuria: no Onset: gradual Severity: mild  Mike [name] currently lives with: alone Interim Problems from Mike [name] last visit: no  Depression Screen done today and results listed below:     09/08/2023    9:44 AM 09/08/2023    9:22 AM 09/05/2022   10:01 AM 09/05/2022   10:00 AM 09/02/2022   11:30 AM  Depression screen PHQ 2/9  Decreased Interest 0 0  0 0  Down, Depressed, Hopeless 0 0  0 0  PHQ - 2 Score 0 0  0 0  Altered sleeping 0 0  0 0  Tired, decreased energy 0 0  0 0  Change in appetite 0 0  0 0  Feeling bad or failure about yourself  0 0  0 0  Trouble concentrating 0 0  0 0  Moving slowly or fidgety/restless 0 0  0 0   Suicidal thoughts 0 0  0 0  PHQ-9 Score 0 0  0 0  Difficult doing work/chores Not difficult at all  Not difficult at all  Not difficult at all    Past Medical History:  Past Medical History:  Diagnosis Date   Arthritis    Benign enlargement of prostate    CAD (coronary artery disease)    Chronic kidney disease    History of seizures as a child    Due to head injury   Hyperlipidemia    Hypertension    Lumbago    Myocardial infarction [facility])    Urinary hesitancy     Surgical History:  Past Surgical History:  Procedure Laterality Date   COLONOSCOPY WITH PROPOFOL  N/A 10/01/2019   Procedure: COLONOSCOPY WITH PROPOFOL ;  Surgeon: Selena Daily, MD;  Location: [facility] ENDOSCOPY;  Service: Gastroenterology;  Laterality: N/A;   coronary artery stent      Medications:  Current Outpatient Medications on File Prior to Visit  Medication Sig   aspirin EC 81 MG tablet Take 81 mg by mouth daily.    EPINEPHrine  0.3 mg/0.3 mL IJ SOAJ injection Inject 0.3 mg into the muscle as needed for anaphylaxis.   tadalafil  (CIALIS ) 20 MG tablet Take 0.5-1 tablets (10-20 mg total) by mouth every other day as needed for erectile dysfunction.   No current facility-administered medications on file prior to visit.    Allergies:  Allergies  Allergen Reactions   Shellfish Allergy  Anaphylaxis   Peanuts [Peanut Oil] Rash   Tuna [Fish Allergy] Hives    Social History:  Social History   Socioeconomic History   Marital status: Divorced    Spouse name: Not on file   Number of children: Not on file   Years of education: Not on file   Highest education level: Some college, no degree  Occupational History   Occupation: retired  Tobacco Use   Smoking status: Former    Current packs/day: 0.00    Average packs/day: 1 pack/day for 30.0 years (30.0 ttl pk-yrs)    Types: Cigarettes    Start date: 04/12/1986    Quit date: 04/12/2016    Years since quitting: 7.4   Smokeless tobacco: Never   Tobacco  comments:    since 15, max 1 ppd x10  Vaping Use   Vaping status: Never Used  Substance and Sexual Activity   Alcohol use: No    Alcohol/week: 0.0 standard drinks of alcohol   Drug use: No   Sexual activity: Yes    Birth control/protection: None  Other Topics Concern   Not on file  Social History Narrative   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    Social Drivers of Health   Financial Resource Strain: Low Risk  (09/02/2022)   Overall Financial Resource Strain (CARDIA)    Difficulty of Paying Living Expenses: Not hard at all  Food Insecurity: No Food Insecurity (09/02/2022)   Hunger Vital Sign    Worried About Running Out of Food in the Last Year: Never true    Ran Out of Food in the Last Year: Never true  Transportation Needs: No Transportation Needs (09/02/2022)   PRAPARE - Administrator, Civil Service (Medical): No    Lack of Transportation (Non-Medical): No  Physical Activity: Sufficiently Active (09/02/2022)   Exercise Vital Sign    Days of Exercise per Week: 7 days    Minutes of Exercise per Session: 60 min  Stress: No Stress Concern Present (09/02/2022)   [name] of Occupational Health - Occupational Stress Questionnaire    Feeling of Stress : Not at all  Social Connections: Moderately Isolated (09/02/2022)   Social Connection and Isolation Panel [NHANES]    Frequency of Communication with Friends and Family: Once a week    Frequency of Social Gatherings with Friends and Family: More than three times a week    Attends Religious Services: Never    Database administrator or Organizations: Yes    Attends Engineer, structural: More than 4 times per year    Marital Status: Divorced  Intimate Partner Violence: Not At Risk (09/02/2022)   Humiliation, Afraid, Rape, and Kick questionnaire    Fear of Current or Ex-Partner: No    Emotionally Abused: No    Physically Abused: No    Sexually Abused: No   Social History   Tobacco Use   Smoking Status Former   Current packs/day: 0.00   Average packs/day: 1 pack/day for 30.0 years (30.0 ttl pk-yrs)   Types: Cigarettes   Start date: 04/12/1986   Quit date: 04/12/2016   Years since quitting: 7.4  Smokeless Tobacco Never  Tobacco Comments   since 15, max 1 ppd x10   Social History   Substance and Sexual Activity  Alcohol Use No   Alcohol/week: 0.0 standard drinks of alcohol    Family History:  Family History  Problem Relation Age of Onset  Hypertension Mother    Breast cancer Mother    Hypertension Father    Stroke Father    Stroke Maternal Grandfather     Past medical history, surgical history, medications, allergies, family history and social history reviewed with patient today and changes made to appropriate areas of the chart.   Review of Systems  Constitutional: Negative.   HENT: Negative.    Eyes: Negative.   Respiratory: Negative.    Cardiovascular: Negative.   Gastrointestinal: Negative.   Genitourinary: Negative.   Musculoskeletal: Negative.   Skin: Negative.   Neurological: Negative.   Endo/Heme/Allergies:  Positive for environmental allergies. Negative for polydipsia. Does not bruise/bleed easily.  Psychiatric/Behavioral: Negative.     All other ROS negative except what is listed above and in the HPI.      Objective:    BP 138/72 (BP Location: Left Arm, Patient Position: Sitting)   Pulse (!) 47   Ht 5\' 9"  (1.753 m)   Wt 211 lb 9.6 oz (96 kg)   SpO2 96%   BMI 31.25 kg/m   Wt Readings from Last 3 Encounters:  09/08/23 211 lb 9.6 oz (96 kg)  03/07/23 208 lb (94.3 kg)  09/05/22 205 lb 12.8 oz (93.4 kg)    Physical Exam Vitals and nursing note reviewed.  Constitutional:      General: Mike [name] is not in acute distress.    Appearance: Normal appearance. Mike [name] is obese. Mike [name] is not ill-appearing, toxic-appearing or diaphoretic.  HENT:     Head: Normocephalic and atraumatic.     Right Ear: Tympanic membrane, ear canal and external ear  normal. There is no impacted cerumen.     Left Ear: Tympanic membrane, ear canal and external ear normal. There is no impacted cerumen.     Nose: Nose normal. No congestion or rhinorrhea.     Mouth/Throat:     Mouth: Mucous membranes are moist.     Pharynx: Oropharynx is clear. No oropharyngeal exudate or posterior oropharyngeal erythema.  Eyes:     General: No scleral icterus.       Right eye: No discharge.        Left eye: No discharge.     Extraocular Movements: Extraocular movements intact.     Conjunctiva/sclera: Conjunctivae normal.     Pupils: Pupils are equal, round, and reactive to light.  Neck:     Vascular: No carotid bruit.  Cardiovascular:     Rate and Rhythm: Normal rate and regular rhythm.     Pulses: Normal pulses.     Heart sounds: No murmur heard.    No friction rub. No gallop.  Pulmonary:     Effort: Pulmonary effort is normal. No respiratory distress.     Breath sounds: Normal breath sounds. No stridor. No wheezing, rhonchi or rales.  Chest:     Chest wall: No tenderness.  Abdominal:     General: Abdomen is flat. Bowel sounds are normal. There is no distension.     Palpations: Abdomen is soft. There is no mass.     Tenderness: There is no abdominal tenderness. There is no right CVA tenderness, left CVA tenderness, guarding or rebound.     Hernia: No hernia is present.  Genitourinary:    Comments: Genital exam deferred with shared decision making Musculoskeletal:        General: No swelling, tenderness, deformity or signs of injury.     Cervical back: Normal range of motion and neck supple. No rigidity. No muscular tenderness.  Right lower leg: No edema.     Left lower leg: No edema.  Lymphadenopathy:     Cervical: No cervical adenopathy.  Skin:    General: Skin is warm and dry.     Capillary Refill: Capillary refill takes less than 2 seconds.     Coloration: Skin is not jaundiced or pale.     Findings: No bruising, erythema, lesion or rash.   Neurological:     General: No focal deficit present.     Mental Status: Mike [name] is alert and oriented to person, place, and time.     Cranial Nerves: No cranial nerve deficit.     Sensory: No sensory deficit.     Motor: No weakness.     Coordination: Coordination normal.     Gait: Gait normal.     Deep Tendon Reflexes: Reflexes normal.  Psychiatric:        Mood and Affect: Mood normal.        Behavior: Behavior normal.        Thought Content: Thought content normal.        Judgment: Judgment normal.     Results for orders placed or performed in visit on 03/07/23  CBC with Differential/Platelet   Collection Time: 03/07/23  9:39 AM  Result Value Ref Range   WBC 6.2 3.4 - 10.8 x10E3/uL   RBC 5.36 4.14 - 5.80 x10E6/uL   Hemoglobin 13.5 13.0 - 17.7 g/dL   Hematocrit 62.9 52.8 - 51.0 %   MCV 81 79 - 97 fL   MCH 25.2 (L) 26.6 - 33.0 pg   MCHC 31.3 (L) 31.5 - 35.7 g/dL   RDW 41.3 24.4 - 01.0 %   Platelets 223 150 - 450 x10E3/uL   Neutrophils 43 Not Estab. %   Lymphs 38 Not Estab. %   Monocytes 10 Not Estab. %   Eos 7 Not Estab. %   Basos 1 Not Estab. %   Neutrophils Absolute 2.7 1.4 - 7.0 x10E3/uL   Lymphocytes Absolute 2.4 0.7 - 3.1 x10E3/uL   Monocytes Absolute 0.6 0.1 - 0.9 x10E3/uL   EOS (ABSOLUTE) 0.5 (H) 0.0 - 0.4 x10E3/uL   Basophils Absolute 0.1 0.0 - 0.2 x10E3/uL   Immature Granulocytes 1 Not Estab. %   Immature Grans (Abs) 0.0 0.0 - 0.1 x10E3/uL  Comprehensive metabolic panel   Collection Time: 03/07/23  9:39 AM  Result Value Ref Range   Glucose 136 (H) 70 - 99 mg/dL   BUN 29 (H) 8 - 27 mg/dL   Creatinine, Ser 2.72 (H) 0.76 - 1.27 mg/dL   eGFR 53 (L) >53 GU/YQI/3.47   BUN/Creatinine Ratio 20 10 - 24   Sodium 138 134 - 144 mmol/L   Potassium 4.8 3.5 - 5.2 mmol/L   Chloride 102 96 - 106 mmol/L   CO2 19 (L) 20 - 29 mmol/L   Calcium 9.8 8.6 - 10.2 mg/dL   Total Protein 6.9 6.0 - 8.5 g/dL   Albumin 4.2 3.9 - 4.9 g/dL   Globulin, Total 2.7 1.5 - 4.5 g/dL   Bilirubin  Total 0.4 0.0 - 1.2 mg/dL   Alkaline Phosphatase 140 (H) 44 - 121 IU/L   AST 40 0 - 40 IU/L   ALT 21 0 - 44 IU/L  Lipid Panel w/o Chol/HDL Ratio   Collection Time: 03/07/23  9:39 AM  Result Value Ref Range   Cholesterol, Total 179 100 - 199 mg/dL   Triglycerides 83 0 - 149 mg/dL   HDL 58 >42  mg/dL   VLDL Cholesterol Cal 15 5 - 40 mg/dL   LDL Chol Calc (NIH) 161 (H) 0 - 99 mg/dL  PSA   Collection Time: 03/07/23  9:39 AM  Result Value Ref Range   Prostate Specific Ag, Serum 0.4 0.0 - 4.0 ng/mL      Assessment & Plan:   Problem List Items Addressed This Visit       Cardiovascular and Mediastinum   CAD (coronary artery disease)   Will keep BP and cholesterol under good control. Continue to monitor. Call with any concerns.       Relevant Medications   lisinopril  (ZESTRIL ) 40 MG tablet   metoprolol  tartrate (LOPRESSOR ) 25 MG tablet   simvastatin  (ZOCOR ) 40 MG tablet     Respiratory   COPD (chronic obstructive pulmonary disease) (HCC)   Under good control on current regimen. Continue current regimen. Continue to monitor. Call with any concerns. Refills given. Labs drawn today.       Relevant Medications   albuterol  (VENTOLIN  HFA) 108 (90 Base) MCG/ACT inhaler     Genitourinary   Benign hypertensive renal disease   Under good control on current regimen. Continue current regimen. Continue to monitor. Call with any concerns. Refills given. Labs drawn today.        Relevant Orders   Comprehensive metabolic panel with GFR   CBC with Differential/Platelet   TSH   Microalbumin, Urine Waived   CKD (chronic kidney disease), stage III (HCC)   Rechecking labs today. Await results. Treat as needed.         Other   HLD (hyperlipidemia)   Under good control on current regimen. Continue current regimen. Continue to monitor. Call with any concerns. Refills given. Labs drawn today.       Relevant Medications   lisinopril  (ZESTRIL ) 40 MG tablet   metoprolol  tartrate (LOPRESSOR )  25 MG tablet   simvastatin  (ZOCOR ) 40 MG tablet   Other Relevant Orders   Comprehensive metabolic panel with GFR   CBC with Differential/Platelet   Lipid Panel w/o Chol/HDL Ratio   Urinary hesitancy due to benign prostatic hyperplasia   Under good control on current regimen. Continue current regimen. Continue to monitor. Call with any concerns. Refills given. Labs drawn today.       Relevant Orders   Comprehensive metabolic panel with GFR   CBC with Differential/Platelet   PSA   Personal history of tobacco use, presenting hazards to health   Unsure if Mike [name] wants to do another low dose CT- will think about it and let us  know.       Other Visit Diagnoses       Routine general medical examination at a health care facility    -  Primary   Vaccines up to date. Screening labs checked today. Colonoscopy up to date. Continue diet and exercise. Call with any concerns.        Discussed aspirin prophylaxis for myocardial infarction prevention and decision was made to continue ASA  LABORATORY TESTING:  Health maintenance labs ordered today as discussed above.   The natural history of prostate cancer and ongoing controversy regarding screening and potential treatment outcomes of prostate cancer has been discussed with the patient. The meaning of a false positive PSA and a false negative PSA has been discussed. Mike [name] indicates understanding of the limitations of this screening test and wishes to proceed with screening PSA testing.   IMMUNIZATIONS:   - Tdap: Tetanus vaccination status reviewed: last tetanus booster within 10  years. - Influenza: Up to date - Pneumovax: Up to date - Prevnar: Up to date - COVID: Not applicable - HPV: Not applicable - Shingrix vaccine: Given elsewhere  SCREENING: - Colonoscopy: Up to date  Discussed with patient purpose of the colonoscopy is to detect colon cancer at curable precancerous or early stages   PATIENT COUNSELING:    Sexuality: Discussed sexually  transmitted diseases, partner selection, use of condoms, avoidance of unintended pregnancy  and contraceptive alternatives.   Advised to avoid cigarette smoking.  I discussed with the patient that most people either abstain from alcohol or drink within safe limits (<=14/week and <=4 drinks/occasion for males, <=7/weeks and <= 3 drinks/occasion for females) and that the risk for alcohol disorders and other health effects rises proportionally with the number of drinks per week and how often a drinker exceeds daily limits.  Discussed cessation/primary prevention of drug use and availability of treatment for abuse.   Diet: Encouraged to adjust caloric intake to maintain  or achieve ideal body weight, to reduce intake of dietary saturated fat and total fat, to limit sodium intake by avoiding high sodium foods and not adding table salt, and to maintain adequate dietary potassium and calcium preferably from fresh fruits, vegetables, and low-fat dairy products.    stressed the importance of regular exercise  Injury prevention: Discussed safety belts, safety helmets, smoke detector, smoking near bedding or upholstery.   Dental health: Discussed importance of regular tooth brushing, flossing, and dental visits.   Follow up plan: NEXT PREVENTATIVE PHYSICAL DUE IN 1 YEAR. Return in about 6 months (around 03/09/2024).

## 2023-09-08 NOTE — Assessment & Plan Note (Signed)
 Rechecking labs today. Await results. Treat as needed.

## 2023-09-09 ENCOUNTER — Ambulatory Visit: Payer: Self-pay | Admitting: Emergency Medicine

## 2023-09-09 ENCOUNTER — Encounter: Payer: Self-pay | Admitting: Family Medicine

## 2023-09-09 VITALS — BP 126/64 | Ht 65.0 in | Wt 209.8 lb

## 2023-09-09 DIAGNOSIS — Z87891 Personal history of nicotine dependence: Secondary | ICD-10-CM

## 2023-09-09 DIAGNOSIS — Z0000 Encounter for general adult medical examination without abnormal findings: Secondary | ICD-10-CM

## 2023-09-09 LAB — COMPREHENSIVE METABOLIC PANEL WITH GFR
ALT: 26 IU/L (ref 0–44)
AST: 39 IU/L (ref 0–40)
Albumin: 3.9 g/dL (ref 3.9–4.9)
Alkaline Phosphatase: 128 IU/L — ABNORMAL HIGH (ref 44–121)
BUN/Creatinine Ratio: 15 (ref 10–24)
BUN: 23 mg/dL (ref 8–27)
Bilirubin Total: 0.4 mg/dL (ref 0.0–1.2)
CO2: 19 mmol/L — ABNORMAL LOW (ref 20–29)
Calcium: 9.2 mg/dL (ref 8.6–10.2)
Chloride: 108 mmol/L — ABNORMAL HIGH (ref 96–106)
Creatinine, Ser: 1.56 mg/dL — ABNORMAL HIGH (ref 0.76–1.27)
Globulin, Total: 2.5 g/dL (ref 1.5–4.5)
Glucose: 116 mg/dL — ABNORMAL HIGH (ref 70–99)
Potassium: 4.6 mmol/L (ref 3.5–5.2)
Sodium: 141 mmol/L (ref 134–144)
Total Protein: 6.4 g/dL (ref 6.0–8.5)
eGFR: 48 mL/min/{1.73_m2} — ABNORMAL LOW (ref >59)

## 2023-09-09 LAB — CBC WITH DIFFERENTIAL/PLATELET
Basophils Absolute: 0.1 10*3/uL (ref 0.0–0.2)
Basos: 1 %
EOS (ABSOLUTE): 0.5 10*3/uL — ABNORMAL HIGH (ref 0.0–0.4)
Eos: 7 %
Hematocrit: 41 % (ref 37.5–51.0)
Hemoglobin: 13.1 g/dL (ref 13.0–17.7)
Immature Grans (Abs): 0 10*3/uL (ref 0.0–0.1)
Immature Granulocytes: 0 %
Lymphocytes Absolute: 2.1 10*3/uL (ref 0.7–3.1)
Lymphs: 33 %
MCH: 25.5 pg — ABNORMAL LOW (ref 26.6–33.0)
MCHC: 32 g/dL (ref 31.5–35.7)
MCV: 80 fL (ref 79–97)
Monocytes Absolute: 0.7 10*3/uL (ref 0.1–0.9)
Monocytes: 11 %
Neutrophils Absolute: 3 10*3/uL (ref 1.4–7.0)
Neutrophils: 48 %
Platelets: 203 10*3/uL (ref 150–450)
RBC: 5.13 x10E6/uL (ref 4.14–5.80)
RDW: 13.8 % (ref 11.6–15.4)
WBC: 6.4 10*3/uL (ref 3.4–10.8)

## 2023-09-09 LAB — LIPID PANEL W/O CHOL/HDL RATIO
Cholesterol, Total: 167 mg/dL (ref 100–199)
HDL: 57 mg/dL (ref >39)
LDL Chol Calc (NIH): 98 mg/dL (ref 0–99)
Triglycerides: 64 mg/dL (ref 0–149)
VLDL Cholesterol Cal: 12 mg/dL (ref 5–40)

## 2023-09-09 LAB — TSH: TSH: 1.67 u[IU]/mL (ref 0.450–4.500)

## 2023-09-09 LAB — PSA: Prostate Specific Ag, Serum: 0.5 ng/mL (ref 0.0–4.0)

## 2023-09-09 NOTE — Patient Instructions (Addendum)
 [name] , Thank you for taking time to come for your Medicare Wellness Visit. I appreciate your ongoing commitment to your health goals. Please review the following plan we discussed and let me know if I can assist you in the future.   Referrals/Orders/Follow-Ups/Clinician Recommendations: I have placed an order for a low dose chest CT scan to screen for lung cancer. Call [facility] @ [phone] to schedule at your earliest convenience.  This is a list of the screening recommended for you and due dates:  Health Maintenance  Topic Date Due   Zoster (Shingles) Vaccine (1 of 2) 08/12/2004   Screening for Lung Cancer  10/10/2021   COVID-19 Vaccine (4 - 2024-25 season) 01/19/2024*   Flu Shot  12/19/2023   Medicare Annual Wellness Visit  09/08/2024   Colon Cancer Screening  09/30/2029   DTaP/Tdap/Td vaccine (3 - Td or Tdap) 08/15/2030   Pneumonia Vaccine  Completed   Hepatitis C Screening  Completed   HPV Vaccine  Aged Out   Meningitis B Vaccine  Aged Out  *Topic was postponed. The date shown is not the original due date.    Advanced directives: (Provided) Advance directive discussed with you today. I have provided a copy for you to complete at home and have notarized. Once this is complete, please bring a copy in to our office so we can scan it into your chart.   Next Medicare Annual Wellness Visit scheduled for next year: Yes, 09/14/24 @ 3:10 (in person visit)  Fall Prevention in the Home, Adult Falls can cause injuries and affect people of all ages. There are many simple things that you can do to make your home safe and to help prevent falls. If you need it, ask for help making these changes. What actions can I take to prevent falls? General information Use good lighting in all rooms. Make sure to: Replace any light bulbs that burn out. Turn on lights if it is dark and use night-lights. Keep items that you use often in easy-to-reach places. Lower the shelves around your home if  needed. Move furniture so that there are clear paths around it. Do not keep throw rugs or other things on the floor that can make you trip. If any of your floors are uneven, fix them. Add color or contrast paint or tape to clearly mark and help you see: Grab bars or handrails. First and last steps of staircases. Where the edge of each step is. If you use a ladder or stepladder: Make sure that it is fully opened. Do not climb a closed ladder. Make sure the sides of the ladder are locked in place. Have someone hold the ladder while you use it. Know where your pets are as you move through your home. What can I do in the bathroom?     Keep the floor dry. Clean up any water that is on the floor right away. Remove soap buildup in the bathtub or shower. Buildup makes bathtubs and showers slippery. Use non-skid mats or decals on the floor of the bathtub or shower. Attach bath mats securely with double-sided, non-slip rug tape. If you need to sit down while you are in the shower, use a non-slip stool. Install grab bars by the toilet and in the bathtub and shower. Do not use towel bars as grab bars. What can I do in the bedroom? Make sure that you have a light by your bed that is easy to reach. Do not use any sheets  or blankets on your bed that hang to the floor. Have a firm bench or chair with side arms that you can use for support when you get dressed. What can I do in the kitchen? Clean up any spills right away. If you need to reach something above you, use a sturdy step stool that has a grab bar. Keep electrical cables out of the way. Do not use floor polish or wax that makes floors slippery. What can I do with my stairs? Do not leave anything on the stairs. Make sure that you have a light switch at the top and the bottom of the stairs. Have them installed if you do not have them. Make sure that there are handrails on both sides of the stairs. Fix handrails that are broken or loose. Make  sure that handrails are as long as the staircases. Install non-slip stair treads on all stairs in your home if they do not have carpet. Avoid having throw rugs at the top or bottom of stairs, or secure the rugs with carpet tape to prevent them from moving. Choose a carpet design that does not hide the edge of steps on the stairs. Make sure that carpet is firmly attached to the stairs. Fix any carpet that is loose or worn. What can I do on the outside of my home? Use bright outdoor lighting. Repair the edges of walkways and driveways and fix any cracks. Clear paths of anything that can make you trip, such as tools or rocks. Add color or contrast paint or tape to clearly mark and help you see high doorway thresholds. Trim any bushes or trees on the main path into your home. Check that handrails are securely fastened and in good repair. Both sides of all steps should have handrails. Install guardrails along the edges of any raised decks or porches. Have leaves, snow, and ice cleared regularly. Use sand, salt, or ice melt on walkways during winter months if you live where there is ice and snow. In the garage, clean up any spills right away, including grease or oil spills. What other actions can I take? Review your medicines with your health care provider. Some medicines can make you confused or feel dizzy. This can increase your chance of falling. Wear closed-toe shoes that fit well and support your feet. Wear shoes that have rubber soles and low heels. Use a cane, walker, scooter, or crutches that help you move around if needed. Talk with your provider about other ways that you can decrease your risk of falls. This may include seeing a physical therapist to learn to do exercises to improve movement and strength. Where to find more information Centers for Disease Control and Prevention, [employer]: TonerPromos.no [employer] on Aging: BaseRingTones.pl National Institute on Aging: BaseRingTones.pl Contact a  health care provider if: You are afraid of falling at home. You feel weak, drowsy, or dizzy at home. You fall at home. Get help right away if you: Lose consciousness or have trouble moving after a fall. Have a fall that causes a head injury. These symptoms may be an emergency. Get help right away. Call 911. Do not wait to see if the symptoms will go away. Do not drive yourself to the hospital. This information is not intended to replace advice given to you by your health care provider. Make sure you discuss any questions you have with your health care provider. Document Revised: 01/07/2022 Document Reviewed: 01/07/2022 Elsevier Patient Education  2024 [employer].

## 2023-09-09 NOTE — Progress Notes (Signed)
 Subjective:   Mike [name] Mike [name]. is a 68 y.o. who presents for a Medicare Wellness preventive visit.  Visit Complete: In person   Persons Participating in Visit: Patient.  AWV Questionnaire: No: Patient Medicare AWV questionnaire was not completed prior to this visit.  Cardiac Risk Factors include: advanced age (>21men, >59 women);male gender;dyslipidemia;hypertension;obesity (BMI >30kg/m2);Other (see comment), Risk factor comments: CAD     Objective:    Today's Vitals   09/09/23 1427  BP: 126/64  Weight: 209 lb 12.8 oz (95.2 kg)  Height: 5\' 5"  (1.651 m)   Body mass index is 34.91 kg/m.     09/09/2023    2:39 PM 09/02/2022   11:32 AM 08/27/2021    9:04 AM 08/25/2020    9:02 AM 10/01/2019   10:28 AM 02/25/2018    9:52 AM 02/21/2017   10:49 AM  Advanced Directives  Does Patient Have a Medical Advance Directive? No No No No No No No  Would patient like information on creating a medical advance directive? Yes (MAU/Ambulatory/Procedural Areas - Information given) No - Patient declined No - Patient declined   Yes (MAU/Ambulatory/Procedural Areas - Information given) No - Patient declined    Current Medications (verified) Outpatient Encounter Medications as of 09/09/2023  Medication Sig   albuterol  (VENTOLIN  HFA) 108 (90 Base) MCG/ACT inhaler INHALE 2 PUFFS INTO THE LUNGS EVERY 6  HOURS AS NEEDED FOR WHEEZING OR SHORTNESS OF BREATH.   aspirin EC 81 MG tablet Take 81 mg by mouth daily.    EPINEPHrine  0.3 mg/0.3 mL IJ SOAJ injection Inject 0.3 mg into the muscle as needed for anaphylaxis.   lisinopril  (ZESTRIL ) 40 MG tablet Take 1 tablet (40 mg total) by mouth daily.   metoprolol  tartrate (LOPRESSOR ) 25 MG tablet Take 1 tablet (25 mg total) by mouth 2 (two) times daily.   simvastatin  (ZOCOR ) 40 MG tablet Take 1 tablet (40 mg total) by mouth daily at 6 PM.   tadalafil  (CIALIS ) 20 MG tablet Take 0.5-1 tablets (10-20 mg total) by mouth every other day as needed for erectile  dysfunction.   tamsulosin  (FLOMAX ) 0.4 MG CAPS capsule Take 1 capsule (0.4 mg total) by mouth daily.   No facility-administered encounter medications on file as of 09/09/2023.    Allergies (verified) Shellfish allergy, Peanuts [peanut oil], and Tuna [fish allergy]   History: Past Medical History:  Diagnosis Date   Arthritis    Benign enlargement of prostate    CAD (coronary artery disease)    Chronic kidney disease    History of seizures as a child    Due to head injury   Hyperlipidemia    Hypertension    Lumbago    Myocardial infarction [facility])    Urinary hesitancy    Past Surgical History:  Procedure Laterality Date   COLONOSCOPY WITH PROPOFOL  N/A 10/01/2019   Procedure: COLONOSCOPY WITH PROPOFOL ;  Surgeon: Selena Daily, MD;  Location: [facility] ENDOSCOPY;  Service: Gastroenterology;  Laterality: N/A;   coronary artery stent     Family History  Problem Relation Age of Onset   Hypertension Mother    Breast cancer Mother    Hypertension Father    Stroke Father    Stroke Maternal Grandfather    Social History   Socioeconomic History   Marital status: Divorced    Spouse name: Not on file   Number of children: 1   Years of education: Not on file   Highest education level: Some college, no degree  Occupational History  Occupation: retired  Tobacco Use   Smoking status: Former    Current packs/day: 0.00    Average packs/day: 1 pack/day for 30.0 years (30.0 ttl pk-yrs)    Types: Cigarettes    Start date: 04/12/1986    Quit date: 04/12/2016    Years since quitting: 7.4   Smokeless tobacco: Never   Tobacco comments:    since 15, max 1 ppd x10  Vaping Use   Vaping status: Never Used  Substance and Sexual Activity   Alcohol use: No    Alcohol/week: 0.0 standard drinks of alcohol   Drug use: No   Sexual activity: Yes    Birth control/protection: None  Other Topics Concern   Not on file  Social History Narrative   Goes to gym 6-7 days a week    Patent examiner    Social Drivers of Health   Financial Resource Strain: Low Risk  (09/09/2023)   Overall Financial Resource Strain (CARDIA)    Difficulty of Paying Living Expenses: Not hard at all  Food Insecurity: No Food Insecurity (09/09/2023)   Hunger Vital Sign    Worried About Running Out of Food in the Last Year: Never true    Ran Out of Food in the Last Year: Never true  Transportation Needs: No Transportation Needs (09/09/2023)   PRAPARE - Administrator, Civil Service (Medical): No    Lack of Transportation (Non-Medical): No  Physical Activity: Sufficiently Active (09/09/2023)   Exercise Vital Sign    Days of Exercise per Week: 7 days    Minutes of Exercise per Session: 120 min  Stress: No Stress Concern Present (09/09/2023)   [name] of Occupational Health - Occupational Stress Questionnaire    Feeling of Stress : Not at all  Social Connections: Socially Isolated (09/09/2023)   Social Connection and Isolation Panel [NHANES]    Frequency of Communication with Friends and Family: Once a week    Frequency of Social Gatherings with Friends and Family: More than three times a week    Attends Religious Services: Never    Database administrator or Organizations: No    Attends Engineer, structural: Never    Marital Status: Divorced    Tobacco Counseling Counseling given: Not Answered Tobacco comments: since 15, max 1 ppd x10    Clinical Intake:  Pre-visit preparation completed: Yes  Pain : No/denies pain     BMI - recorded: 34.91 Nutritional Status: BMI > 30  Obese Nutritional Risks: None Diabetes: No  No results found for: "HGBA1C"   How often do you need to have someone help you when you read instructions, pamphlets, or other written materials from your doctor or pharmacy?: 1 - Never  Interpreter Needed?: No  Information entered by :: Jaunita Messier, CMA   Activities of Daily Living     09/09/2023    2:31 PM  In your present state of  health, do you have any difficulty performing the following activities:  Hearing? 0  Vision? 0  Difficulty concentrating or making decisions? 0  Walking or climbing stairs? 1  Comment has trouble with stairs due to sciatica  Dressing or bathing? 0  Doing errands, shopping? 0  Preparing Food and eating ? N  Using the Toilet? N  In the past six months, have you accidently leaked urine? N  Do you have problems with loss of bowel control? N  Managing your Medications? N  Managing your Finances? N  Housekeeping or managing  your Housekeeping? N    Patient Care Team: Solomon Dupre, DO as PCP - General (Family Medicine) [facility], [facility] (Optometry)  Indicate any recent Medical Services you may have received from other than [facility] providers in the past year (date may be approximate).     Assessment:   This is a routine wellness examination for Adon.  Hearing/Vision screen Hearing Screening - Comments:: Denies hearing loss Vision Screening - Comments:: Gets routine eye exams, [facility] [place]   Goals Addressed             This Visit's Progress    Patient Stated       Maintain current health       Depression Screen     09/09/2023    2:36 PM 09/08/2023    9:44 AM 09/08/2023    9:22 AM 09/05/2022   10:00 AM 09/02/2022   11:30 AM 02/15/2022    2:22 PM 08/27/2021    9:07 AM  PHQ 2/9 Scores  PHQ - 2 Score 0 0 0 0 0 0 0  PHQ- 9 Score 0 0 0 0 0 0 0    Fall Risk     09/09/2023    2:41 PM 09/08/2023    9:45 AM 09/05/2022   10:01 AM 09/02/2022   11:33 AM 02/15/2022    2:22 PM  Fall Risk   Falls in the past year? 1 0 0 1 0  Number falls in past yr: 0 0  0 0  Injury with Fall? 0 0  0 0  Risk for fall due to : History of fall(s);Impaired balance/gait;Orthopedic patient History of fall(s)  History of fall(s);Other (Comment) No Fall Risks  Risk for fall due to: Comment    tripped   Follow up Falls prevention discussed;Falls evaluation completed Follow up  appointment  Falls prevention discussed;Falls evaluation completed Falls evaluation completed    MEDICARE RISK AT HOME:  Medicare Risk at Home Any stairs in or around the home?: No If so, are there any without handrails?: No Home free of loose throw rugs in walkways, pet beds, electrical cords, etc?: Yes Adequate lighting in your home to reduce risk of falls?: Yes Life alert?: No Use of a cane, walker or w/c?: No Grab bars in the bathroom?: Yes Shower chair or bench in shower?: No Elevated toilet seat or a handicapped toilet?: No  TIMED UP AND GO:  Was the test performed?  Yes  Length of time to ambulate 10 feet: 5 sec Gait steady and fast without use of assistive device  Cognitive Function: 6CIT completed        09/09/2023    2:42 PM 09/02/2022   11:37 AM 08/27/2021    9:10 AM 08/25/2020    9:06 AM 02/25/2018    9:54 AM  6CIT Screen  What Year? 0 points 0 points 0 points 0 points 0 points  What month? 0 points 0 points 0 points 0 points 0 points  What time? 0 points 0 points 0 points 0 points 0 points  Count back from 20 0 points 0 points 0 points 0 points 0 points  Months in reverse 0 points 0 points 0 points 0 points 0 points  Repeat phrase 0 points 0 points 2 points 2 points 2 points  Total Score 0 points 0 points 2 points 2 points 2 points    Immunizations Immunization History  Administered Date(s) Administered   Fluad Quad(high Dose 65+) 02/14/2020   Fluad Trivalent(High Dose  65+) 03/07/2023   Influenza,inj,Quad PF,6+ Mos 03/20/2015, 04/02/2016, 02/21/2017, 02/25/2018, 02/02/2019, 02/14/2021, 02/15/2022   Moderna Sars-Covid-2 Vaccination 09/11/2019, 10/11/2019   Pfizer(Comirnaty)Fall Seasonal Vaccine 12 years and older 03/07/2023   Pneumococcal Conjugate-13 02/16/2014   Pneumococcal Polysaccharide-23 02/14/2020   Td 08/14/2020   Tdap 05/20/2010   Zoster, Live 07/19/2014    Screening Tests Health Maintenance  Topic Date Due   Zoster Vaccines- Shingrix (1 of  2) 08/12/2004   Lung Cancer Screening  10/10/2021   COVID-19 Vaccine (4 - 2024-25 season) 01/19/2024 (Originally 09/05/2023)   INFLUENZA VACCINE  12/19/2023   Medicare Annual Wellness (AWV)  09/08/2024   Colonoscopy  09/30/2029   DTaP/Tdap/Td (3 - Td or Tdap) 08/15/2030   Pneumonia Vaccine 79+ Years old  Completed   Hepatitis C Screening  Completed   HPV VACCINES  Aged Out   Meningococcal B Vaccine  Aged Out    Health Maintenance  Health Maintenance Due  Topic Date Due   Zoster Vaccines- Shingrix (1 of 2) 08/12/2004   Lung Cancer Screening  10/10/2021   Health Maintenance Items Addressed: Lung Cancer Screening ordered, See Nurse Notes  Additional Screening:  Vision Screening: Recommended annual ophthalmology exams for early detection of glaucoma and other disorders of the eye.  Dental Screening: Recommended annual dental exams for proper oral hygiene  Community Resource Referral / Chronic Care Management: CRR required this visit?  No   CCM required this visit?  No     Plan:     I have personally reviewed and noted the following in the patient's chart:   Medical and social history Use of alcohol, tobacco or illicit drugs  Current medications and supplements including opioid prescriptions. Patient is not currently taking opioid prescriptions. Functional ability and status Nutritional status Physical activity Advanced directives List of other physicians Hospitalizations, surgeries, and ER visits in previous 12 months Vitals Screenings to include cognitive, depression, and falls Referrals and appointments  In addition, I have reviewed and discussed with patient certain preventive protocols, quality metrics, and best practice recommendations. A written personalized care plan for preventive services as well as general preventive health recommendations were provided to patient.     Jaunita Messier, [facility]   09/09/2023   After Visit Summary: (In Person-Printed) AVS  printed and given to the patient  Notes:  Placed order for LDCT for lung cancer screening Declined shingles vaccine

## 2023-09-10 ENCOUNTER — Other Ambulatory Visit: Payer: Self-pay

## 2023-09-10 DIAGNOSIS — Z87891 Personal history of nicotine dependence: Secondary | ICD-10-CM

## 2023-09-10 DIAGNOSIS — F17210 Nicotine dependence, cigarettes, uncomplicated: Secondary | ICD-10-CM

## 2023-09-10 DIAGNOSIS — Z122 Encounter for screening for malignant neoplasm of respiratory organs: Secondary | ICD-10-CM

## 2023-09-10 NOTE — Progress Notes (Signed)
 Letter printed and mailed.

## 2023-09-10 NOTE — Progress Notes (Signed)
Results printed and mailed.   

## 2023-10-02 ENCOUNTER — Other Ambulatory Visit: Payer: Self-pay | Admitting: Family Medicine

## 2023-10-03 NOTE — Telephone Encounter (Signed)
 Too soon for refill.  Requested Prescriptions  Pending Prescriptions Disp Refills   albuterol  (VENTOLIN  HFA) 108 (90 Base) MCG/ACT inhaler [Pharmacy Med Name: albuterol  sulfate HFA 90 mcg/actuation aerosol inhaler] 18 g 6    Sig: INHALE TWO PUFFS BY MOUTH INTO LUNGS EVERY 6 HOURS AS NEEDED FOR WHEEZING OR FOR SHORTNESS OF BREATH     Pulmonology:  Beta Agonists 2 Passed - 10/03/2023  1:34 PM      Passed - Last BP in normal range    BP Readings from Last 1 Encounters:  09/09/23 126/64         Passed - Last Heart Rate in normal range    Pulse Readings from Last 1 Encounters:  09/08/23 (!) 47         Passed - Valid encounter within last 12 months    Recent Outpatient Visits           3 weeks ago Routine general medical examination at a health care facility   [facility] [place], [place] P, DO

## 2023-10-15 ENCOUNTER — Encounter: Payer: Self-pay | Admitting: Acute Care

## 2023-10-21 ENCOUNTER — Telehealth: Payer: Self-pay

## 2023-10-21 DIAGNOSIS — I739 Peripheral vascular disease, unspecified: Secondary | ICD-10-CM

## 2023-10-21 NOTE — Telephone Encounter (Signed)
 Copied from CRM [id number]. Topic: General - Other >> Oct 21, 2023  3:29 PM Emylou G wrote: Reason for CRM: Ave Leisure NP w/[facility] [phone] he had screening.. shows bilateral severe PAD.Aaron Aas recommend: urgent vascular consult

## 2023-10-22 ENCOUNTER — Ambulatory Visit
Admission: RE | Admit: 2023-10-22 | Discharge: 2023-10-22 | Disposition: A | Discharge status: Home / Self Care | Source: Ambulatory Visit | Attending: Acute Care | Admitting: Acute Care

## 2023-10-22 DIAGNOSIS — Z122 Encounter for screening for malignant neoplasm of respiratory organs: Secondary | ICD-10-CM | POA: Diagnosis not present

## 2023-10-22 DIAGNOSIS — Z87891 Personal history of nicotine dependence: Secondary | ICD-10-CM | POA: Insufficient documentation

## 2023-11-07 ENCOUNTER — Other Ambulatory Visit: Payer: Self-pay

## 2023-11-07 DIAGNOSIS — Z87891 Personal history of nicotine dependence: Secondary | ICD-10-CM

## 2023-11-07 DIAGNOSIS — Z122 Encounter for screening for malignant neoplasm of respiratory organs: Secondary | ICD-10-CM

## 2023-12-30 ENCOUNTER — Other Ambulatory Visit (INDEPENDENT_AMBULATORY_CARE_PROVIDER_SITE_OTHER): Payer: Self-pay | Admitting: Nurse Practitioner

## 2023-12-30 DIAGNOSIS — I739 Peripheral vascular disease, unspecified: Secondary | ICD-10-CM

## 2024-01-01 ENCOUNTER — Ambulatory Visit (INDEPENDENT_AMBULATORY_CARE_PROVIDER_SITE_OTHER)

## 2024-01-01 ENCOUNTER — Ambulatory Visit (INDEPENDENT_AMBULATORY_CARE_PROVIDER_SITE_OTHER): Payer: Self-pay | Admitting: Nurse Practitioner

## 2024-01-01 ENCOUNTER — Other Ambulatory Visit (INDEPENDENT_AMBULATORY_CARE_PROVIDER_SITE_OTHER): Payer: Self-pay | Admitting: Nurse Practitioner

## 2024-01-01 ENCOUNTER — Encounter (INDEPENDENT_AMBULATORY_CARE_PROVIDER_SITE_OTHER): Payer: Self-pay | Admitting: Nurse Practitioner

## 2024-01-01 VITALS — BP 126/72 | HR 52 | Ht 65.0 in | Wt 208.4 lb

## 2024-01-01 DIAGNOSIS — I739 Peripheral vascular disease, unspecified: Secondary | ICD-10-CM

## 2024-01-01 DIAGNOSIS — I70213 Atherosclerosis of native arteries of extremities with intermittent claudication, bilateral legs: Secondary | ICD-10-CM | POA: Diagnosis not present

## 2024-01-01 DIAGNOSIS — E782 Mixed hyperlipidemia: Secondary | ICD-10-CM | POA: Diagnosis not present

## 2024-01-01 LAB — VAS US ABI WITH/WO TBI
Left ABI: 0.87
Right ABI: 1.22

## 2024-01-02 ENCOUNTER — Telehealth (INDEPENDENT_AMBULATORY_CARE_PROVIDER_SITE_OTHER): Payer: Self-pay

## 2024-01-02 NOTE — Telephone Encounter (Addendum)
 Spoke with the patient and he is scheduled with Dr. Jama on 01/20/24 for a RLE angio at the [facility]. Arrival time is 6:45 am and pre-procedure instructions were discussed and will be mailed.

## 2024-01-04 ENCOUNTER — Encounter (INDEPENDENT_AMBULATORY_CARE_PROVIDER_SITE_OTHER): Payer: Self-pay | Admitting: Nurse Practitioner

## 2024-01-04 NOTE — H&P (View-Only) (Signed)
 Subjective:    Patient ID: Mike Guadlupe Dominic Mickey., male    DOB: 1955-09-27, 68 y.o.   MRN: [id number] Chief Complaint  Patient presents with   New Patient (Initial Visit)    New patient for PAD      The patient is seen for evaluation of painful lower extremities and diminished pulses. Patient notes the pain is always associated with activity and is very consistent day today. Typically, the pain occurs at less than one block, progress is as activity continues to the point that the patient must stop walking. Resting including standing still for several minutes allows the patient to walk a similar distance before being forced to stop again. Uneven terrain and inclines shorten the distance. The pain has been progressive over the past several years. The patient denies any abrupt changes in claudication symptoms.  The patient states the inability to walk is causing problems with daily activities.  The patient denies rest pain or dangling of an extremity off the side of the bed during the night for relief. No open wounds or sores at this time. No prior interventions or surgeries.  No history of back problems or DJD of the lumbar sacral spine.   The patient's blood pressure has been stable and relatively well controlled. The patient denies amaurosis fugax or recent TIA symptoms. There are no recent neurological changes noted. The patient denies history of DVT, PE or superficial thrombophlebitis. The patient denies recent episodes of angina or shortness of breath.   Patient has ABI of 1.22 on the right and 0.87 on the left.  Additional arterial duplex shows occlusion of the left proximal SFA with dampened monophasic waveforms distally.    Review of Systems  Cardiovascular:        Claudication  All other systems reviewed and are negative.      Objective:   Physical Exam Vitals reviewed.  HENT:     Head: Normocephalic.  Cardiovascular:     Rate and Rhythm: Normal rate.     Pulses:           Dorsalis pedis pulses are detected w/ Doppler on the right side and detected w/ Doppler on the left side.       Posterior tibial pulses are detected w/ Doppler on the right side and detected w/ Doppler on the left side.  Pulmonary:     Effort: Pulmonary effort is normal.  Skin:    General: Skin is warm and dry.  Neurological:     Mental Status: He is alert and oriented to person, place, and time.  Psychiatric:        Mood and Affect: Mood normal.        Behavior: Behavior normal.        Thought Content: Thought content normal.        Judgment: Judgment normal.     BP 126/72   Pulse (!) 52   Ht 5' 5 (1.651 m)   Wt 208 lb 6.4 oz (94.5 kg)   BMI 34.68 kg/m   Past Medical History:  Diagnosis Date   Arthritis    Benign enlargement of prostate    CAD (coronary artery disease)    Chronic kidney disease    History of seizures as a child    Due to head injury   Hyperlipidemia    Hypertension    Lumbago    Myocardial infarction [facility])    Urinary hesitancy     Social History   Socioeconomic History  Marital status: Divorced    Spouse name: Not on file   Number of children: 1   Years of education: Not on file   Highest education level: Some college, no degree  Occupational History   Occupation: retired  Tobacco Use   Smoking status: Former    Current packs/day: 0.00    Average packs/day: 1 pack/day for 30.0 years (30.0 ttl pk-yrs)    Types: Cigarettes    Start date: 04/12/1986    Quit date: 04/12/2016    Years since quitting: 7.7   Smokeless tobacco: Never   Tobacco comments:    since 15, max 1 ppd x10  Vaping Use   Vaping status: Never Used  Substance and Sexual Activity   Alcohol use: No    Alcohol/week: 0.0 standard drinks of alcohol   Drug use: No   Sexual activity: Yes    Birth control/protection: None  Other Topics Concern   Not on file  Social History Narrative   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    Social Drivers of Health    Financial Resource Strain: Low Risk  (09/09/2023)   Overall Financial Resource Strain (CARDIA)    Difficulty of Paying Living Expenses: Not hard at all  Food Insecurity: No Food Insecurity (09/09/2023)   Hunger Vital Sign    Worried About Running Out of Food in the Last Year: Never true    Ran Out of Food in the Last Year: Never true  Transportation Needs: No Transportation Needs (09/09/2023)   PRAPARE - Administrator, Civil Service (Medical): No    Lack of Transportation (Non-Medical): No  Physical Activity: Sufficiently Active (09/09/2023)   Exercise Vital Sign    Days of Exercise per Week: 7 days    Minutes of Exercise per Session: 120 min  Stress: No Stress Concern Present (09/09/2023)   [name] of Occupational Health - Occupational Stress Questionnaire    Feeling of Stress : Not at all  Social Connections: Socially Isolated (09/09/2023)   Social Connection and Isolation Panel    Frequency of Communication with Friends and Family: Once a week    Frequency of Social Gatherings with Friends and Family: More than three times a week    Attends Religious Services: Never    Database administrator or Organizations: No    Attends Banker Meetings: Never    Marital Status: Divorced  Catering manager Violence: Not At Risk (09/09/2023)   Humiliation, Afraid, Rape, and Kick questionnaire    Fear of Current or Ex-Partner: No    Emotionally Abused: No    Physically Abused: No    Sexually Abused: No    Past Surgical History:  Procedure Laterality Date   COLONOSCOPY WITH PROPOFOL  N/A 10/01/2019   Procedure: COLONOSCOPY WITH PROPOFOL ;  Surgeon: Unk Corinn Skiff, MD;  Location: [facility] ENDOSCOPY;  Service: Gastroenterology;  Laterality: N/A;   coronary artery stent      Family History  Problem Relation Age of Onset   Hypertension Mother    Breast cancer Mother    Hypertension Father    Stroke Father    Stroke Maternal Grandfather     Allergies   Allergen Reactions   Shellfish Allergy Anaphylaxis   Peanuts [Peanut Oil] Rash   Tuna [Fish Allergy] Hives       Latest Ref Rng & Units 09/08/2023    9:47 AM 03/07/2023    9:39 AM 09/05/2022   10:07 AM  CBC  WBC 3.4 - 10.8 x10E3/uL 6.4  6.2  6.2   Hemoglobin 13.0 - 17.7 g/dL 86.8  86.4  85.9   Hematocrit 37.5 - 51.0 % 41.0  43.2  43.8   Platelets 150 - 450 x10E3/uL 203  223  193       CMP     Component Value Date/Time   NA 141 09/08/2023 0947   K 4.6 09/08/2023 0947   CL 108 (H) 09/08/2023 0947   CO2 19 (L) 09/08/2023 0947   GLUCOSE 116 (H) 09/08/2023 0947   BUN 23 09/08/2023 0947   CREATININE 1.56 (H) 09/08/2023 0947   CALCIUM 9.2 09/08/2023 0947   PROT 6.4 09/08/2023 0947   ALBUMIN 3.9 09/08/2023 0947   AST 39 09/08/2023 0947   ALT 26 09/08/2023 0947   ALKPHOS 128 (H) 09/08/2023 0947   BILITOT 0.4 09/08/2023 0947   EGFR 48 (L) 09/08/2023 0947   GFRNONAA 42 (L) 02/14/2020 1014     VAS US  ABI WITH/WO TBI Result Date: 01/01/2024  LOWER EXTREMITY DOPPLER STUDY Patient Name:  Mike [name] [employer].  Date of Exam:   01/01/2024 Medical Rec #: [id number]                  Accession #:    [id number] Date of Birth: Jun 08, 1955                  Patient Gender: M Patient Age:   78 years Exam [facility] Procedure:      VAS US  ABI WITH/WO TBI Referring Phys: ORVIN DARING --------------------------------------------------------------------------------  Indications: Rest pain.  Performing Technologist: Leafy Gibes RVS  Examination Guidelines: A complete evaluation includes at minimum, Doppler waveform signals and systolic blood pressure reading at the level of bilateral brachial, anterior tibial, and posterior tibial arteries, when vessel segments are accessible. Bilateral testing is considered an integral part of a complete examination. Photoelectric Plethysmograph (PPG) waveforms and toe systolic pressure readings are included as required and additional  duplex testing as needed. Limited examinations for reoccurring indications may be performed as noted.  ABI Findings: +---------+------------------+-----+---------+--------+ Right    Rt Pressure (mmHg)IndexWaveform Comment  +---------+------------------+-----+---------+--------+ Brachial 130                                      +---------+------------------+-----+---------+--------+ ATA      147               1.07 triphasic         +---------+------------------+-----+---------+--------+ PTA      167               1.22 triphasic         +---------+------------------+-----+---------+--------+ Great Toe119               0.87 Normal            +---------+------------------+-----+---------+--------+ +---------+------------------+-----+----------+-------+ Left     Lt Pressure (mmHg)IndexWaveform  Comment +---------+------------------+-----+----------+-------+ Brachial 137                                      +---------+------------------+-----+----------+-------+ ATA      104               0.76 monophasic        +---------+------------------+-----+----------+-------+ PTA      121  0.88 monophasic        +---------+------------------+-----+----------+-------+ Great Toe104               0.76 Normal            +---------+------------------+-----+----------+-------+ +-------+-----------+-----------+------------+------------+ ABI/TBIToday's ABIToday's TBIPrevious ABIPrevious TBI +-------+-----------+-----------+------------+------------+ Right  1.22       .88                                 +-------+-----------+-----------+------------+------------+ Left   .87        .76                                 +-------+-----------+-----------+------------+------------+  Summary: Right: Resting right ankle-brachial index is within normal range. The right toe-brachial index is normal. Left: Resting left ankle-brachial index indicates mild left  lower extremity arterial disease. The left toe-brachial index is normal. *See table(s) above for measurements and observations.  Electronically signed by Cordella Shawl MD on 01/01/2024 at 5:02:50 PM.    Final        Assessment & Plan:   1. Atherosclerosis of native artery of both lower extremities with intermittent claudication (HCC) (Primary) Recommend:  The patient has experienced increased claudication symptoms and is now describing lifestyle limiting claudication and appears to be having mild rest pain symptroms.  Given the severity of the patient's severe left lower extremity symptoms the patient should undergo angiography with the hope for intervention.  Risk and benefits were reviewed the patient.  Indications for the procedure were reviewed.  All questions were answered, the patient agrees to proceed with left lower extremity angiography and possible intervention.   The patient should continue walking and begin a more formal exercise program.  The patient should continue antiplatelet therapy and aggressive treatment of the lipid abnormalities  The patient will follow up with me after the angiogram.   2. Mixed hyperlipidemia Continue statin as ordered and reviewed, no changes at this time   Current Outpatient Medications on File Prior to Visit  Medication Sig Dispense Refill   albuterol  (VENTOLIN  HFA) 108 (90 Base) MCG/ACT inhaler INHALE 2 PUFFS INTO THE LUNGS EVERY 6  HOURS AS NEEDED FOR WHEEZING OR SHORTNESS OF BREATH. 18 g 6   aspirin EC 81 MG tablet Take 81 mg by mouth daily.      EPINEPHrine  0.3 mg/0.3 mL IJ SOAJ injection Inject 0.3 mg into the muscle as needed for anaphylaxis. 2 each 3   lisinopril  (ZESTRIL ) 40 MG tablet Take 1 tablet (40 mg total) by mouth daily. 90 tablet 1   metoprolol  tartrate (LOPRESSOR ) 25 MG tablet Take 1 tablet (25 mg total) by mouth 2 (two) times daily. 180 tablet 1   simvastatin  (ZOCOR ) 40 MG tablet Take 1 tablet (40 mg total) by mouth daily at 6  PM. 90 tablet 1   tadalafil  (CIALIS ) 20 MG tablet Take 0.5-1 tablets (10-20 mg total) by mouth every other day as needed for erectile dysfunction. 10 tablet 11   tamsulosin  (FLOMAX ) 0.4 MG CAPS capsule Take 1 capsule (0.4 mg total) by mouth daily. 90 capsule 1   No current facility-administered medications on file prior to visit.    There are no Patient Instructions on file for this visit. No follow-ups on file.   Lexxus Underhill E Treazure Nery, NP

## 2024-01-04 NOTE — Progress Notes (Incomplete)
 Subjective:    Patient ID: Mike [name]., male    DOB: Dec 28, 1955, 68 y.o.   MRN: [id number] Chief Complaint  Patient presents with  . New Patient (Initial Visit)    New patient for PAD     HPI  Review of Systems     Objective:   Physical Exam  BP 126/72   Pulse (!) 52   Ht 5' 5 (1.651 m)   Wt 208 lb 6.4 oz (94.5 kg)   BMI 34.68 kg/m   Past Medical History:  Diagnosis Date  . Arthritis   . Benign enlargement of prostate   . CAD (coronary artery disease)   . Chronic kidney disease   . History of seizures as a child    Due to head injury  . Hyperlipidemia   . Hypertension   . Lumbago   . Myocardial infarction (HCC)   . Urinary hesitancy     Social History   Socioeconomic History  . Marital status: Divorced    Spouse name: Not on file  . Number of children: 1  . Years of education: Not on file  . Highest education level: Some college, no degree  Occupational History  . Occupation: retired  Tobacco Use  . Smoking status: Former    Current packs/day: 0.00    Average packs/day: 1 pack/day for 30.0 years (30.0 ttl pk-yrs)    Types: Cigarettes    Start date: 04/12/1986    Quit date: 04/12/2016    Years since quitting: 7.7  . Smokeless tobacco: Never  . Tobacco comments:    since 15, max 1 ppd x10  Vaping Use  . Vaping status: Never Used  Substance and Sexual Activity  . Alcohol use: No    Alcohol/week: 0.0 standard drinks of alcohol  . Drug use: No  . Sexual activity: Yes    Birth control/protection: None  Other Topics Concern  . 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 Difficulty of Paying Living Expenses: Not hard at all  Food Insecurity: No Food Insecurity (09/09/2023)   Hunger Vital Sign   . Worried About Programme researcher, broadcasting/film/video in the Last Year: Never true    . Ran Out of Food in the Last Year: Never true  Transportation Needs: No Transportation Needs (09/09/2023)   PRAPARE - Transportation   . Lack of Transportation (Medical): No   . Lack of Transportation (Non-Medical): No  Physical Activity: Sufficiently Active (09/09/2023)   Exercise Vital Sign   . Days of Exercise per Week: 7 days   . Minutes of Exercise per Session: 120 min  Stress: No Stress Concern Present (09/09/2023)   [name] of Occupational Health - Occupational Stress Questionnaire   . Feeling of Stress : Not at all  Social Connections: Socially Isolated (09/09/2023)   Social Connection and Isolation Panel   . Frequency of Communication with Friends and Family: Once a week   . Frequency of Social Gatherings with Friends and Family: More than three times a week   . Attends Religious Services: Never   . Active Member of Clubs or Organizations: No   . Attends Banker Meetings: Never   . Marital Status: Divorced  Catering manager Violence: Not At Risk (09/09/2023)   Humiliation,  Afraid, Rape, and Kick questionnaire   . Fear of Current or Ex-Partner: No   . Emotionally Abused: No   . Physically Abused: No   . Sexually Abused: No    Past Surgical History:  Procedure Laterality Date  . COLONOSCOPY WITH PROPOFOL  N/A 10/01/2019   Procedure: COLONOSCOPY WITH PROPOFOL ;  Surgeon: Unk Corinn Skiff, MD;  Location: [facility] ENDOSCOPY;  Service: Gastroenterology;  Laterality: N/A;  . coronary artery stent      Family History  Problem Relation Age of Onset  . Hypertension Mother   . Breast cancer Mother   . Hypertension Father   . Stroke Father   . Stroke Maternal Grandfather     Allergies  Allergen Reactions  . Shellfish Allergy Anaphylaxis  . Peanuts [Peanut Oil] Rash  . Tuna [Fish Allergy] Hives       Latest Ref Rng & Units 09/08/2023    9:47 AM 03/07/2023    9:39 AM 09/05/2022   10:07 AM  CBC  WBC 3.4 - 10.8 x10E3/uL 6.4  6.2  6.2   Hemoglobin  13.0 - 17.7 g/dL 86.8  86.4  85.9   Hematocrit 37.5 - 51.0 % 41.0  43.2  43.8   Platelets 150 - 450 x10E3/uL 203  223  193       CMP     Component Value Date/Time   NA 141 09/08/2023 0947   K 4.6 09/08/2023 0947   CL 108 (H) 09/08/2023 0947   CO2 19 (L) 09/08/2023 0947   GLUCOSE 116 (H) 09/08/2023 0947   BUN 23 09/08/2023 0947   CREATININE 1.56 (H) 09/08/2023 0947   CALCIUM 9.2 09/08/2023 0947   PROT 6.4 09/08/2023 0947   ALBUMIN 3.9 09/08/2023 0947   AST 39 09/08/2023 0947   ALT 26 09/08/2023 0947   ALKPHOS 128 (H) 09/08/2023 0947   BILITOT 0.4 09/08/2023 0947   EGFR 48 (L) 09/08/2023 0947   GFRNONAA 42 (L) 02/14/2020 1014     VAS US  ABI WITH/WO TBI Result Date: 01/01/2024  LOWER EXTREMITY DOPPLER STUDY Patient Name:  [name]  Date of Exam:   01/01/2024 Medical Rec #: [id number]                  Accession #:    [id number] Date of Birth: 04-Aug-1955                  Patient Gender: M Patient Age:   86 years Exam Location:  [facility] Procedure:      VAS US  ABI WITH/WO TBI Referring Phys: ORVIN DARING --------------------------------------------------------------------------------  Indications: Rest pain.  Performing Technologist: Leafy Gibes RVS  Examination Guidelines: A complete evaluation includes at minimum, Doppler waveform signals and systolic blood pressure reading at the level of bilateral brachial, anterior tibial, and posterior tibial arteries, when vessel segments are accessible. Bilateral testing is considered an integral part of a complete examination. Photoelectric Plethysmograph (PPG) waveforms and toe systolic pressure readings are included as required and additional duplex testing as needed. Limited examinations for reoccurring indications may be performed as noted.  ABI Findings: +---------+------------------+-----+---------+--------+ Right    Rt Pressure (mmHg)IndexWaveform Comment   +---------+------------------+-----+---------+--------+ Brachial 130                                      +---------+------------------+-----+---------+--------+ ATA      147  1.07 triphasic         +---------+------------------+-----+---------+--------+ PTA      167               1.22 triphasic         +---------+------------------+-----+---------+--------+ Great Toe119               0.87 Normal            +---------+------------------+-----+---------+--------+ +---------+------------------+-----+----------+-------+ Left     Lt Pressure (mmHg)IndexWaveform  Comment +---------+------------------+-----+----------+-------+ Brachial 137                                      +---------+------------------+-----+----------+-------+ ATA      104               0.76 monophasic        +---------+------------------+-----+----------+-------+ PTA      121               0.88 monophasic        +---------+------------------+-----+----------+-------+ Great Toe104               0.76 Normal            +---------+------------------+-----+----------+-------+ +-------+-----------+-----------+------------+------------+ ABI/TBIToday's ABIToday's TBIPrevious ABIPrevious TBI +-------+-----------+-----------+------------+------------+ Right  1.22       .88                                 +-------+-----------+-----------+------------+------------+ Left   .87        .76                                 +-------+-----------+-----------+------------+------------+  Summary: Right: Resting right ankle-brachial index is within normal range. The right toe-brachial index is normal. Left: Resting left ankle-brachial index indicates mild left lower extremity arterial disease. The left toe-brachial index is normal. *See table(s) above for measurements and observations.  Electronically signed by Cordella Shawl MD on 01/01/2024 at 5:02:50 PM.    Final        Assessment &  Plan:   1. Atherosclerosis of native artery of both lower extremities with intermittent claudication (HCC) (Primary) ***  2. Mixed hyperlipidemia ***   Current Outpatient Medications on File Prior to Visit  Medication Sig Dispense Refill  . albuterol  (VENTOLIN  HFA) 108 (90 Base) MCG/ACT inhaler INHALE 2 PUFFS INTO THE LUNGS EVERY 6  HOURS AS NEEDED FOR WHEEZING OR SHORTNESS OF BREATH. 18 g 6  . aspirin EC 81 MG tablet Take 81 mg by mouth daily.     . EPINEPHrine  0.3 mg/0.3 mL IJ SOAJ injection Inject 0.3 mg into the muscle as needed for anaphylaxis. 2 each 3  . lisinopril  (ZESTRIL ) 40 MG tablet Take 1 tablet (40 mg total) by mouth daily. 90 tablet 1  . metoprolol  tartrate (LOPRESSOR ) 25 MG tablet Take 1 tablet (25 mg total) by mouth 2 (two) times daily. 180 tablet 1  . simvastatin  (ZOCOR ) 40 MG tablet Take 1 tablet (40 mg total) by mouth daily at 6 PM. 90 tablet 1  . tadalafil  (CIALIS ) 20 MG tablet Take 0.5-1 tablets (10-20 mg total) by mouth every other day as needed for erectile dysfunction. 10 tablet 11  . tamsulosin  (FLOMAX ) 0.4 MG CAPS capsule Take 1 capsule (0.4 mg total) by mouth daily. 90 capsule 1   No  current facility-administered medications on file prior to visit.    There are no Patient Instructions on file for this visit. No follow-ups on file.   Cloyd Ragas E Aubrea Meixner, NP

## 2024-01-04 NOTE — Progress Notes (Signed)
 Subjective:    Patient ID: Mike [name]., male    DOB: 1955-09-27, 68 y.o.   MRN: [id number] Chief Complaint  Patient presents with   New Patient (Initial Visit)    New patient for PAD      The patient is seen for evaluation of painful lower extremities and diminished pulses. Patient notes the pain is always associated with activity and is very consistent day today. Typically, the pain occurs at less than one block, progress is as activity continues to the point that the patient must stop walking. Resting including standing still for several minutes allows the patient to walk a similar distance before being forced to stop again. Uneven terrain and inclines shorten the distance. The pain has been progressive over the past several years. The patient denies any abrupt changes in claudication symptoms.  The patient states the inability to walk is causing problems with daily activities.  The patient denies rest pain or dangling of an extremity off the side of the bed during the night for relief. No open wounds or sores at this time. No prior interventions or surgeries.  No history of back problems or DJD of the lumbar sacral spine.   The patient's blood pressure has been stable and relatively well controlled. The patient denies amaurosis fugax or recent TIA symptoms. There are no recent neurological changes noted. The patient denies history of DVT, PE or superficial thrombophlebitis. The patient denies recent episodes of angina or shortness of breath.   Patient has ABI of 1.22 on the right and 0.87 on the left.  Additional arterial duplex shows occlusion of the left proximal SFA with dampened monophasic waveforms distally.    Review of Systems  Cardiovascular:        Claudication  All other systems reviewed and are negative.      Objective:   Physical Exam Vitals reviewed.  HENT:     Head: Normocephalic.  Cardiovascular:     Rate and Rhythm: Normal rate.     Pulses:           Dorsalis pedis pulses are detected w/ Doppler on the right side and detected w/ Doppler on the left side.       Posterior tibial pulses are detected w/ Doppler on the right side and detected w/ Doppler on the left side.  Pulmonary:     Effort: Pulmonary effort is normal.  Skin:    General: Skin is warm and dry.  Neurological:     Mental Status: He is alert and oriented to person, place, and time.  Psychiatric:        Mood and Affect: Mood normal.        Behavior: Behavior normal.        Thought Content: Thought content normal.        Judgment: Judgment normal.     BP 126/72   Pulse (!) 52   Ht 5' 5 (1.651 m)   Wt 208 lb 6.4 oz (94.5 kg)   BMI 34.68 kg/m   Past Medical History:  Diagnosis Date   Arthritis    Benign enlargement of prostate    CAD (coronary artery disease)    Chronic kidney disease    History of seizures as a child    Due to head injury   Hyperlipidemia    Hypertension    Lumbago    Myocardial infarction [facility])    Urinary hesitancy     Social History   Socioeconomic History  Marital status: Divorced    Spouse name: Not on file   Number of children: 1   Years of education: Not on file   Highest education level: Some college, no degree  Occupational History   Occupation: retired  Tobacco Use   Smoking status: Former    Current packs/day: 0.00    Average packs/day: 1 pack/day for 30.0 years (30.0 ttl pk-yrs)    Types: Cigarettes    Start date: 04/12/1986    Quit date: 04/12/2016    Years since quitting: 7.7   Smokeless tobacco: Never   Tobacco comments:    since 15, max 1 ppd x10  Vaping Use   Vaping status: Never Used  Substance and Sexual Activity   Alcohol use: No    Alcohol/week: 0.0 standard drinks of alcohol   Drug use: No   Sexual activity: Yes    Birth control/protection: None  Other Topics Concern   Not on file  Social History Narrative   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    Social Drivers of Health    Financial Resource Strain: Low Risk  (09/09/2023)   Overall Financial Resource Strain (CARDIA)    Difficulty of Paying Living Expenses: Not hard at all  Food Insecurity: No Food Insecurity (09/09/2023)   Hunger Vital Sign    Worried About Running Out of Food in the Last Year: Never true    Ran Out of Food in the Last Year: Never true  Transportation Needs: No Transportation Needs (09/09/2023)   PRAPARE - Administrator, Civil Service (Medical): No    Lack of Transportation (Non-Medical): No  Physical Activity: Sufficiently Active (09/09/2023)   Exercise Vital Sign    Days of Exercise per Week: 7 days    Minutes of Exercise per Session: 120 min  Stress: No Stress Concern Present (09/09/2023)   [name] of Occupational Health - Occupational Stress Questionnaire    Feeling of Stress : Not at all  Social Connections: Socially Isolated (09/09/2023)   Social Connection and Isolation Panel    Frequency of Communication with Friends and Family: Once a week    Frequency of Social Gatherings with Friends and Family: More than three times a week    Attends Religious Services: Never    Database administrator or Organizations: No    Attends Banker Meetings: Never    Marital Status: Divorced  Catering manager Violence: Not At Risk (09/09/2023)   Humiliation, Afraid, Rape, and Kick questionnaire    Fear of Current or Ex-Partner: No    Emotionally Abused: No    Physically Abused: No    Sexually Abused: No    Past Surgical History:  Procedure Laterality Date   COLONOSCOPY WITH PROPOFOL  N/A 10/01/2019   Procedure: COLONOSCOPY WITH PROPOFOL ;  Surgeon: Unk Corinn Skiff, MD;  Location: [facility] ENDOSCOPY;  Service: Gastroenterology;  Laterality: N/A;   coronary artery stent      Family History  Problem Relation Age of Onset   Hypertension Mother    Breast cancer Mother    Hypertension Father    Stroke Father    Stroke Maternal Grandfather     Allergies   Allergen Reactions   Shellfish Allergy Anaphylaxis   Peanuts [Peanut Oil] Rash   Tuna [Fish Allergy] Hives       Latest Ref Rng & Units 09/08/2023    9:47 AM 03/07/2023    9:39 AM 09/05/2022   10:07 AM  CBC  WBC 3.4 - 10.8 x10E3/uL 6.4  6.2  6.2   Hemoglobin 13.0 - 17.7 g/dL 86.8  86.4  85.9   Hematocrit 37.5 - 51.0 % 41.0  43.2  43.8   Platelets 150 - 450 x10E3/uL 203  223  193       CMP     Component Value Date/Time   NA 141 09/08/2023 0947   K 4.6 09/08/2023 0947   CL 108 (H) 09/08/2023 0947   CO2 19 (L) 09/08/2023 0947   GLUCOSE 116 (H) 09/08/2023 0947   BUN 23 09/08/2023 0947   CREATININE 1.56 (H) 09/08/2023 0947   CALCIUM 9.2 09/08/2023 0947   PROT 6.4 09/08/2023 0947   ALBUMIN 3.9 09/08/2023 0947   AST 39 09/08/2023 0947   ALT 26 09/08/2023 0947   ALKPHOS 128 (H) 09/08/2023 0947   BILITOT 0.4 09/08/2023 0947   EGFR 48 (L) 09/08/2023 0947   GFRNONAA 42 (L) 02/14/2020 1014     VAS US  ABI WITH/WO TBI Result Date: 01/01/2024  LOWER EXTREMITY DOPPLER STUDY Patient Name:  Isaul Landi [employer].  Date of Exam:   01/01/2024 Medical Rec #: [id number]                  Accession #:    [id number] Date of Birth: Jun 08, 1955                  Patient Gender: M Patient Age:   78 years [facility]am [facility] Procedure:      VAS US  ABI WITH/WO TBI Referring Phys: ORVIN DARING --------------------------------------------------------------------------------  Indications: Rest pain.  Performing Technologist: Leafy Gibes RVS  Examination Guidelines: A complete evaluation includes at minimum, Doppler waveform signals and systolic blood pressure reading at the level of bilateral brachial, anterior tibial, and posterior tibial arteries, when vessel segments are accessible. Bilateral testing is considered an integral part of a complete examination. Photoelectric Plethysmograph (PPG) waveforms and toe systolic pressure readings are included as required and additional  duplex testing as needed. Limited examinations for reoccurring indications may be performed as noted.  ABI Findings: +---------+------------------+-----+---------+--------+ Right    Rt Pressure (mmHg)IndexWaveform Comment  +---------+------------------+-----+---------+--------+ Brachial 130                                      +---------+------------------+-----+---------+--------+ ATA      147               1.07 triphasic         +---------+------------------+-----+---------+--------+ PTA      167               1.22 triphasic         +---------+------------------+-----+---------+--------+ Great Toe119               0.87 Normal            +---------+------------------+-----+---------+--------+ +---------+------------------+-----+----------+-------+ Left     Lt Pressure (mmHg)IndexWaveform  Comment +---------+------------------+-----+----------+-------+ Brachial 137                                      +---------+------------------+-----+----------+-------+ ATA      104               0.76 monophasic        +---------+------------------+-----+----------+-------+ PTA      121  0.88 monophasic        +---------+------------------+-----+----------+-------+ Great Toe104               0.76 Normal            +---------+------------------+-----+----------+-------+ +-------+-----------+-----------+------------+------------+ ABI/TBIToday's ABIToday's TBIPrevious ABIPrevious TBI +-------+-----------+-----------+------------+------------+ Right  1.22       .88                                 +-------+-----------+-----------+------------+------------+ Left   .87        .76                                 +-------+-----------+-----------+------------+------------+  Summary: Right: Resting right ankle-brachial index is within normal range. The right toe-brachial index is normal. Left: Resting left ankle-brachial index indicates mild left  lower extremity arterial disease. The left toe-brachial index is normal. *See table(s) above for measurements and observations.  Electronically signed by Cordella Shawl MD on 01/01/2024 at 5:02:50 PM.    Final        Assessment & Plan:   1. Atherosclerosis of native artery of both lower extremities with intermittent claudication (HCC) (Primary) Recommend:  The patient has experienced increased claudication symptoms and is now describing lifestyle limiting claudication and appears to be having mild rest pain symptroms.  Given the severity of the patient's severe left lower extremity symptoms the patient should undergo angiography with the hope for intervention.  Risk and benefits were reviewed the patient.  Indications for the procedure were reviewed.  All questions were answered, the patient agrees to proceed with left lower extremity angiography and possible intervention.   The patient should continue walking and begin a more formal exercise program.  The patient should continue antiplatelet therapy and aggressive treatment of the lipid abnormalities  The patient will follow up with me after the angiogram.   2. Mixed hyperlipidemia Continue statin as ordered and reviewed, no changes at this time   Current Outpatient Medications on File Prior to Visit  Medication Sig Dispense Refill   albuterol  (VENTOLIN  HFA) 108 (90 Base) MCG/ACT inhaler INHALE 2 PUFFS INTO THE LUNGS EVERY 6  HOURS AS NEEDED FOR WHEEZING OR SHORTNESS OF BREATH. 18 g 6   aspirin EC 81 MG tablet Take 81 mg by mouth daily.      EPINEPHrine  0.3 mg/0.3 mL IJ SOAJ injection Inject 0.3 mg into the muscle as needed for anaphylaxis. 2 each 3   lisinopril  (ZESTRIL ) 40 MG tablet Take 1 tablet (40 mg total) by mouth daily. 90 tablet 1   metoprolol  tartrate (LOPRESSOR ) 25 MG tablet Take 1 tablet (25 mg total) by mouth 2 (two) times daily. 180 tablet 1   simvastatin  (ZOCOR ) 40 MG tablet Take 1 tablet (40 mg total) by mouth daily at 6  PM. 90 tablet 1   tadalafil  (CIALIS ) 20 MG tablet Take 0.5-1 tablets (10-20 mg total) by mouth every other day as needed for erectile dysfunction. 10 tablet 11   tamsulosin  (FLOMAX ) 0.4 MG CAPS capsule Take 1 capsule (0.4 mg total) by mouth daily. 90 capsule 1   No current facility-administered medications on file prior to visit.    There are no Patient Instructions on file for this visit. No follow-ups on file.   Lexxus Underhill E Treazure Nery, NP

## 2024-01-07 ENCOUNTER — Telehealth: Payer: Self-pay | Admitting: Family Medicine

## 2024-01-07 NOTE — Telephone Encounter (Unsigned)
 Copied from CRM #[id number]. Topic: General - Other >> Jan 07, 2024 11:51 AM DeAngela L wrote: Reason for CRM: patient insurance provider calling to ask if the doctor would submit a Prior Auth for the patient to have surgery on 01/20/2024  Reason for this call is the patient has a large out of pocket cost and they are trying an understanding of who is requesting this surgery for the patient  [place] healthcare Provider line [phone]

## 2024-01-20 ENCOUNTER — Encounter: Payer: Self-pay | Admitting: Anesthesiology

## 2024-01-20 ENCOUNTER — Other Ambulatory Visit: Payer: Self-pay

## 2024-01-20 ENCOUNTER — Encounter: Payer: Self-pay | Admitting: Vascular Surgery

## 2024-01-20 ENCOUNTER — Encounter
Admission: RE | Disposition: A | Discharge status: Home / Self Care | Payer: Self-pay | Source: Home / Self Care | Attending: Vascular Surgery

## 2024-01-20 ENCOUNTER — Ambulatory Visit
Admission: RE | Admit: 2024-01-20 | Discharge: 2024-01-20 | Disposition: A | Discharge status: Home / Self Care | Attending: Vascular Surgery | Admitting: Vascular Surgery

## 2024-01-20 DIAGNOSIS — E782 Mixed hyperlipidemia: Secondary | ICD-10-CM | POA: Insufficient documentation

## 2024-01-20 DIAGNOSIS — I70222 Atherosclerosis of native arteries of extremities with rest pain, left leg: Secondary | ICD-10-CM

## 2024-01-20 DIAGNOSIS — Z87891 Personal history of nicotine dependence: Secondary | ICD-10-CM | POA: Diagnosis not present

## 2024-01-20 DIAGNOSIS — I70201 Unspecified atherosclerosis of native arteries of extremities, right leg: Secondary | ICD-10-CM | POA: Diagnosis not present

## 2024-01-20 DIAGNOSIS — I708 Atherosclerosis of other arteries: Secondary | ICD-10-CM

## 2024-01-20 DIAGNOSIS — I70229 Atherosclerosis of native arteries of extremities with rest pain, unspecified extremity: Secondary | ICD-10-CM

## 2024-01-20 DIAGNOSIS — Z604 Social exclusion and rejection: Secondary | ICD-10-CM | POA: Diagnosis not present

## 2024-01-20 HISTORY — PX: LOWER EXTREMITY INTERVENTION: CATH118252

## 2024-01-20 HISTORY — PX: LOWER EXTREMITY ANGIOGRAPHY: CATH118251

## 2024-01-20 LAB — CREATININE, SERUM
Creatinine, Ser: 1.56 mg/dL — ABNORMAL HIGH (ref 0.61–1.24)
GFR, Estimated: 48 mL/min — ABNORMAL LOW (ref >60)

## 2024-01-20 LAB — BUN: BUN: 27 mg/dL — ABNORMAL HIGH (ref 8–23)

## 2024-01-20 SURGERY — LOWER EXTREMITY INTERVENTION
Anesthesia: Moderate Sedation | Site: Leg Lower | Laterality: Left

## 2024-01-20 MED ORDER — SODIUM CHLORIDE 0.9 % IV SOLN: INTRAVENOUS | Status: DC

## 2024-01-20 MED ORDER — HEPARIN (PORCINE) IN NACL 1000-0.9 UT/500ML-% IV SOLN
INTRAVENOUS | Status: DC | PRN
  Administered 2024-01-20: 1000 mL

## 2024-01-20 MED ORDER — FENTANYL CITRATE (PF) 100 MCG/2ML IJ SOLN
INTRAMUSCULAR | Status: DC | PRN
  Administered 2024-01-20: 50 ug via INTRAVENOUS
  Administered 2024-01-20: 25 ug via INTRAVENOUS

## 2024-01-20 MED ORDER — MIDAZOLAM HCL 5 MG/5ML IJ SOLN
INTRAMUSCULAR | Status: AC
  Filled 2024-01-20: qty 5

## 2024-01-20 MED ORDER — FAMOTIDINE 20 MG PO TABS
40.0000 mg | ORAL_TABLET | Freq: Once | ORAL | Status: AC | PRN
  Administered 2024-01-20: 40 mg via ORAL

## 2024-01-20 MED ORDER — FENTANYL CITRATE (PF) 100 MCG/2ML IJ SOLN
INTRAMUSCULAR | Status: AC
Start: 2024-01-20 — End: 2024-01-20
  Filled 2024-01-20: qty 2

## 2024-01-20 MED ORDER — HYDROMORPHONE HCL 1 MG/ML IJ SOLN: 1.0000 mg | Freq: Once | INTRAMUSCULAR | Status: DC | PRN

## 2024-01-20 MED ORDER — CEFAZOLIN SODIUM-DEXTROSE 2-4 GM/100ML-% IV SOLN
2.0000 g | INTRAVENOUS | Status: AC
  Administered 2024-01-20: 2 g via INTRAVENOUS

## 2024-01-20 MED ORDER — ACETAMINOPHEN 325 MG PO TABS: 650.0000 mg | ORAL_TABLET | ORAL | Status: DC | PRN

## 2024-01-20 MED ORDER — CEFAZOLIN SODIUM-DEXTROSE 2-4 GM/100ML-% IV SOLN
INTRAVENOUS | Status: AC
Start: 2024-01-20 — End: 2024-01-20
  Filled 2024-01-20: qty 100

## 2024-01-20 MED ORDER — LABETALOL HCL 5 MG/ML IV SOLN: 10.0000 mg | INTRAVENOUS | Status: DC | PRN

## 2024-01-20 MED ORDER — ONDANSETRON HCL 4 MG/2ML IJ SOLN: 4.0000 mg | Freq: Four times a day (QID) | INTRAMUSCULAR | Status: DC | PRN

## 2024-01-20 MED ORDER — SODIUM CHLORIDE 0.9% FLUSH: 3.0000 mL | Freq: Two times a day (BID) | INTRAVENOUS | Status: DC

## 2024-01-20 MED ORDER — METHYLPREDNISOLONE SODIUM SUCC 125 MG IJ SOLR
125.0000 mg | Freq: Once | INTRAMUSCULAR | Status: AC | PRN
  Administered 2024-01-20: 125 mg via INTRAVENOUS

## 2024-01-20 MED ORDER — METHYLPREDNISOLONE SODIUM SUCC 125 MG IJ SOLR
INTRAMUSCULAR | Status: AC
  Filled 2024-01-20: qty 2

## 2024-01-20 MED ORDER — MORPHINE SULFATE (PF) 2 MG/ML IV SOLN: 2.0000 mg | INTRAVENOUS | Status: DC | PRN

## 2024-01-20 MED ORDER — MIDAZOLAM HCL 2 MG/2ML IJ SOLN
INTRAMUSCULAR | Status: DC | PRN
  Administered 2024-01-20: 1 mg via INTRAVENOUS
  Administered 2024-01-20: 2 mg via INTRAVENOUS

## 2024-01-20 MED ORDER — LIDOCAINE HCL (PF) 1 % IJ SOLN
INTRAMUSCULAR | Status: DC | PRN
  Administered 2024-01-20: 10 mL

## 2024-01-20 MED ORDER — DIPHENHYDRAMINE HCL 50 MG/ML IJ SOLN
INTRAMUSCULAR | Status: AC
  Filled 2024-01-20: qty 1

## 2024-01-20 MED ORDER — MIDAZOLAM HCL 2 MG/ML PO SYRP: 8.0000 mg | ORAL_SOLUTION | Freq: Once | ORAL | Status: DC | PRN

## 2024-01-20 MED ORDER — IODIXANOL 320 MG/ML IV SOLN
INTRAVENOUS | Status: DC | PRN
  Administered 2024-01-20: 65 mL

## 2024-01-20 MED ORDER — OXYCODONE HCL 5 MG PO TABS: 5.0000 mg | ORAL_TABLET | ORAL | Status: DC | PRN

## 2024-01-20 MED ORDER — SODIUM CHLORIDE 0.9% FLUSH: 3.0000 mL | INTRAVENOUS | Status: DC | PRN

## 2024-01-20 MED ORDER — HYDRALAZINE HCL 20 MG/ML IJ SOLN: 5.0000 mg | INTRAMUSCULAR | Status: DC | PRN

## 2024-01-20 MED ORDER — FAMOTIDINE 20 MG PO TABS
ORAL_TABLET | ORAL | Status: AC
  Filled 2024-01-20: qty 2

## 2024-01-20 MED ORDER — SODIUM CHLORIDE 0.9 % IV SOLN: 250.0000 mL | INTRAVENOUS | Status: DC | PRN

## 2024-01-20 MED ORDER — HEPARIN SODIUM (PORCINE) 1000 UNIT/ML IJ SOLN
INTRAMUSCULAR | Status: DC | PRN
  Administered 2024-01-20: 6000 [IU] via INTRAVENOUS

## 2024-01-20 MED ORDER — HEPARIN SODIUM (PORCINE) 1000 UNIT/ML IJ SOLN
INTRAMUSCULAR | Status: AC
  Filled 2024-01-20: qty 10

## 2024-01-20 MED ORDER — DIPHENHYDRAMINE HCL 50 MG/ML IJ SOLN
50.0000 mg | Freq: Once | INTRAMUSCULAR | Status: AC | PRN
  Administered 2024-01-20: 50 mg via INTRAVENOUS

## 2024-01-20 SURGICAL SUPPLY — 21 items
CATH ANGIO 5F PIGTAIL 65CM (CATHETERS) IMPLANT
CATH BEACON 5 .038 100 VERT TP (CATHETERS) IMPLANT
CATH BEACON 5.038 65CM KMP-01 (CATHETERS) IMPLANT
COVER PROBE ULTRASOUND 5X96 (MISCELLANEOUS) IMPLANT
DEVICE PRESTO INFLATION (MISCELLANEOUS) IMPLANT
DEVICE TORQUE (MISCELLANEOUS) IMPLANT
DEVICE VASC CLSR CELT ART 6 (Vascular Products) IMPLANT
GLIDEWIRE ADV .035X260CM (WIRE) IMPLANT
GLIDEWIRE ANGLED SS 035X260CM (WIRE) IMPLANT
GOWN STRL REUS W/ TWL LRG LVL3 (GOWN DISPOSABLE) ×1 IMPLANT
NDL ENTRY 21GA 7CM ECHOTIP (NEEDLE) IMPLANT
NEEDLE ENTRY 21GA 7CM ECHOTIP (NEEDLE) ×1 IMPLANT
PACK ANGIOGRAPHY (CUSTOM PROCEDURE TRAY) ×1 IMPLANT
SET INTRO CAPELLA COAXIAL (SET/KITS/TRAYS/PACK) IMPLANT
SHEATH ANL2 6FRX45 HC (SHEATH) IMPLANT
SHEATH BRITE TIP 6FRX11 (SHEATH) IMPLANT
SHEATH BRITE TIP 6FRX5.5 (SHEATH) IMPLANT
SYR MEDRAD MARK 7 150ML (SYRINGE) IMPLANT
TUBING CONTRAST HIGH PRESS 72 (TUBING) IMPLANT
WIRE J 3MM .035X145CM (WIRE) IMPLANT
WIRE NITINOL .018 (WIRE) IMPLANT

## 2024-01-20 NOTE — Interval H&P Note (Signed)
 History and Physical Interval Note:  01/20/2024 9:18 AM  Mike Earnest Yamaguchi Jr.  has presented today for surgery, with the diagnosis of RLE Angio   ASO w rest pain.  The various methods of treatment have been discussed with the patient and family. After consideration of risks, benefits and other options for treatment, the patient has consented to  Procedure(s): LOWER EXTREMITY INTERVENTION (Left) Lower Extremity Angiography (Left) as a surgical intervention.  The patient's history has been reviewed, patient examined, no change in status, stable for surgery.  I have reviewed the patient's chart and labs.  Questions were answered to the patient's satisfaction.     Cordella Shawl

## 2024-01-20 NOTE — Op Note (Signed)
 [facility]  Percutaneous Study/Intervention Procedural Note   Date of Surgery: 01/20/2024,10:20 AM  Surgeon:Khiya Friese, Cordella MATSU   Pre-operative Diagnosis: Atherosclerotic occlusive disease bilateral lower extremities with rest pain of the left lower extremity  Post-operative diagnosis:  Same  Procedure(s) Performed:  1.  Abdominal aortogram  2.  Selective injection of the left lower extremity third order catheter placement  3.  Ultrasound-guided access to the right common femoral artery  4.  Celt right common femoral artery    Anesthesia: Conscious sedation was administered by the interventional radiology RN under my direct supervision. IV Versed  plus fentanyl  were utilized. Continuous ECG, pulse oximetry and blood pressure was monitored throughout the entire procedure.  Conscious sedation was administered for a total of 55 minutes.  Sheath: 6 [place] Ansell sheath retrograde right common femoral  Contrast: 65 cc   Fluoroscopy Time: 17.2 minutes  Indications:  The patient presents to [facility] with atherosclerotic occlusive disease bilateral lower extremities with rest pain of the left lower extremity.  Pedal pulses are nonpalpable bilaterally suggesting hemodynamically significant atherosclerotic occlusive disease.  The risks and benefits as well as alternative therapies for lower extremity revascularization are reviewed with the patient all questions are answered the patient agrees to proceed.  The patient is therefore undergoing angiography with the hope for intervention for limb salvage.   Procedure:  Ein Rijo Huaracha Jr.is a 68 y.o. male who was identified and appropriate procedural time out was performed.  The patient was then placed supine on the table and prepped and draped in the usual sterile fashion.  Ultrasound was used to evaluate the right common femoral artery.  It was echolucent and pulsatile indicating it is patent .  An ultrasound  image was acquired for the permanent record.  A micropuncture needle was used to access the right common femoral artery under direct ultrasound guidance.  The microwire was then advanced under fluoroscopic guidance without difficulty followed by the micro-sheath.  A 0.035 J wire was advanced without resistance and a 5Fr sheath was placed.    Pigtail catheter was then advanced to the level of T12 and AP projection of the aorta was obtained. Pigtail catheter was then repositioned to above the bifurcation and RAO view of the pelvis was obtained. Stiff angled Glidewire and pigtail catheter was then used across the bifurcation and the catheter was positioned in the distal external iliac artery.  LAO of the left groin was then obtained. Wire was reintroduced and negotiated into the SFA and the catheter was advanced into the SFA. Distal runoff was then performed.  The sheath was upsized to a 6 [place] and using a Kumpe catheter and both an advantage wire as well as a stiff angled Glidewire attempts at crossing the SFA occlusion were performed.  This was unsuccessful.  After review of the images the catheter was removed over wire and an LAO view of the groin was obtained. StarClose device was deployed without difficulty.   Findings:   Aortogram: The abdominal aorta is opacified with a bolus injection contrast.  Single renal arteries are noted bilaterally with normal nephrograms.  No evidence of hemodynamically significant renal artery stenosis.  There are no hemodynamically significant stenoses identified within the aorta.  The aortic bifurcation is mildly diseased but widely patent.  Bilateral common internal and external iliac arteries are free of hemodynamically significant lesions.  Left lower Extremity: The left common femoral and profunda femoris arteries are patent.  There is a 50% stenosis in  the common femoral artery.  The superficial femoral artery occludes approximately 1 to 2 cm distal to the  origin.  There is reconstitution of the above-knee popliteal secondary to profunda collaterals.  Popliteal is patent.  Trifurcation is heavily diseased with occlusion of the TP trunk as well as the posterior tibial and peroneal.  The anterior tibial is patent down to the foot but there is a greater than 75% stenosis at the origin.    SUMMARY: Based on these images no intervention is performed at this time.  Access via a pedal approach is 1 possibility alternatively a femoral endarterectomy with a femoral above-knee popliteal bypass and angioplasty of the anterior tibial at the time of surgery is a secondary option    Disposition: Patient was taken to the recovery room in stable condition having tolerated the procedure well.  Cordella Chaniya Genter 01/20/2024,10:20 AM

## 2024-01-20 NOTE — Progress Notes (Signed)
 Left a message with Dr.Schnier's office to call pt for a follow-up in 1 week with Dr.Schnier, no ABI's.

## 2024-01-21 ENCOUNTER — Encounter: Payer: Self-pay | Admitting: Vascular Surgery

## 2024-01-22 ENCOUNTER — Other Ambulatory Visit: Payer: Self-pay | Admitting: Family Medicine

## 2024-01-22 NOTE — Telephone Encounter (Signed)
 Requested Prescriptions  Pending Prescriptions Disp Refills   albuterol  (VENTOLIN  HFA) 108 (90 Base) MCG/ACT inhaler [Pharmacy Med Name: albuterol  sulfate HFA 90 mcg/actuation aerosol inhaler] 18 g 6    Sig: INHALE TWO PUFFS BY MOUTH INTO LUNGS EVERY 6 HOURS AS NEEDED FOR WHEEZING OR FOR SHORTNESS OF BREATH     Pulmonology:  Beta Agonists 2 Passed - 01/22/2024  2:55 PM      Passed - Last BP in normal range    BP Readings from Last 1 Encounters:  01/20/24 135/60         Passed - Last Heart Rate in normal range    Pulse Readings from Last 1 Encounters:  01/20/24 (!) 55         Passed - Valid encounter within last 12 months    Recent Outpatient Visits           4 months ago Routine general medical examination at a health care facility   [facility] Mertzon, Zwolle P, DO

## 2024-01-26 ENCOUNTER — Encounter (INDEPENDENT_AMBULATORY_CARE_PROVIDER_SITE_OTHER): Payer: Self-pay | Admitting: Vascular Surgery

## 2024-01-26 ENCOUNTER — Ambulatory Visit (INDEPENDENT_AMBULATORY_CARE_PROVIDER_SITE_OTHER): Admitting: Vascular Surgery

## 2024-01-26 VITALS — BP 149/71 | HR 54 | Ht 65.0 in | Wt 213.6 lb

## 2024-01-26 DIAGNOSIS — I2510 Atherosclerotic heart disease of native coronary artery without angina pectoris: Secondary | ICD-10-CM | POA: Diagnosis not present

## 2024-01-26 DIAGNOSIS — J449 Chronic obstructive pulmonary disease, unspecified: Secondary | ICD-10-CM

## 2024-01-26 DIAGNOSIS — I70223 Atherosclerosis of native arteries of extremities with rest pain, bilateral legs: Secondary | ICD-10-CM | POA: Diagnosis not present

## 2024-01-26 DIAGNOSIS — E782 Mixed hyperlipidemia: Secondary | ICD-10-CM

## 2024-01-26 NOTE — H&P (View-Only) (Signed)
 MRN : [id number]  Mike [name]. is a 68 y.o. ([birth year]) male who presents with chief complaint of check circulation.  History of Present Illness: ***  Current Meds  Medication Sig   albuterol  (VENTOLIN  HFA) 108 (90 Base) MCG/ACT inhaler INHALE TWO PUFFS BY MOUTH INTO LUNGS EVERY 6 HOURS AS NEEDED FOR WHEEZING OR FOR SHORTNESS OF BREATH   aspirin  EC 81 MG tablet Take 81 mg by mouth daily.    EPINEPHrine  0.3 mg/0.3 mL IJ SOAJ injection Inject 0.3 mg into the muscle as needed for anaphylaxis.   lisinopril  (ZESTRIL ) 40 MG tablet Take 1 tablet (40 mg total) by mouth daily.   metoprolol  tartrate (LOPRESSOR ) 25 MG tablet Take 1 tablet (25 mg total) by mouth 2 (two) times daily.   simvastatin  (ZOCOR ) 40 MG tablet Take 1 tablet (40 mg total) by mouth daily at 6 PM.   tadalafil  (CIALIS ) 20 MG tablet Take 0.5-1 tablets (10-20 mg total) by mouth every other day as needed for erectile dysfunction.   tamsulosin  (FLOMAX ) 0.4 MG CAPS capsule Take 1 capsule (0.4 mg total) by mouth daily.    Past Medical History:  Diagnosis Date   Arthritis    Benign enlargement of prostate    CAD (coronary artery disease)    Chronic kidney disease    History of seizures as a child    Due to head injury   Hyperlipidemia    Hypertension    Lumbago    Myocardial infarction [facility])    Urinary hesitancy     Past Surgical History:  Procedure Laterality Date   COLONOSCOPY WITH PROPOFOL  N/A 10/01/2019   Procedure: COLONOSCOPY WITH PROPOFOL ;  Surgeon: Unk Corinn Skiff, MD;  Location: [facility];  Service: Gastroenterology;  Laterality: N/A;   coronary artery stent     LOWER EXTREMITY ANGIOGRAPHY Left 01/20/2024   Procedure: Lower Extremity Angiography;  Surgeon: Jama Cordella MATSU, MD;  Location: [facility];  Service: Cardiovascular;  Laterality: Left;   LOWER EXTREMITY INTERVENTION Left 01/20/2024   Procedure: LOWER  EXTREMITY INTERVENTION;  Surgeon: Jama Cordella MATSU, MD;  Location: [facility];  Service: Cardiovascular;  Laterality: Left;    Social History Social History   Tobacco Use   Smoking status: Former    Current packs/day: 0.00    Average packs/day: 1 pack/day for 30.0 years (30.0 ttl pk-yrs)    Types: Cigarettes    Start date: 04/12/1986    Quit date: 04/12/2016    Years since quitting: 7.7   Smokeless tobacco: Never   Tobacco comments:    since 15, max 1 ppd x10  Vaping Use   Vaping status: Never Used  Substance Use Topics   Alcohol use: No    Alcohol/week: 0.0 standard drinks of alcohol   Drug use: No    Family History Family History  Problem Relation Age of Onset   Hypertension Mother    Breast cancer Mother    Hypertension Father    Stroke Father    Stroke Maternal Grandfather     Allergies  Allergen Reactions   Shellfish Allergy  Anaphylaxis   Peanuts [Peanut Oil] Rash   Tuna [Fish Allergy] Hives     REVIEW OF SYSTEMS (Negative unless checked)  Constitutional: [] Weight loss  [] Fever  [] Chills Cardiac: [] Chest pain   [] Chest pressure   [] Palpitations   [] Shortness of breath when laying flat   [] Shortness of breath with exertion. Vascular:  [x] Pain in legs with walking   [] Pain in legs at rest  [] History of DVT   [] Phlebitis   [] Swelling in legs   [] Varicose veins   [] Non-healing ulcers Pulmonary:   [] Uses home oxygen   [] Productive cough   [] Hemoptysis   [] Wheeze  [] COPD   [] Asthma Neurologic:  [] Dizziness   [] Seizures   [] History of stroke   [] History of TIA  [] Aphasia   [] Vissual changes   [] Weakness or numbness in arm   [] Weakness or numbness in leg Musculoskeletal:   [] Joint swelling   [] Joint pain   [] Low back pain Hematologic:  [] Easy bruising  [] Easy bleeding   [] Hypercoagulable state   [] Anemic Gastrointestinal:  [] Diarrhea   [] Vomiting  [] Gastroesophageal reflux/heartburn   [] Difficulty swallowing. Genitourinary:  [] Chronic kidney disease    [] Difficult urination  [] Frequent urination   [] Blood in urine Skin:  [] Rashes   [] Ulcers  Psychological:  [] History of anxiety   []  History of major depression.  Physical Examination  Vitals:   01/26/24 0956  BP: (!) 149/71  Pulse: (!) 54  Weight: 213 lb 9.6 oz (96.9 kg)  Height: 5' 5 (1.651 m)   Body mass index is 35.54 kg/m. Gen: WD/WN, NAD Head: [place]/AT, No temporalis wasting.  Ear/Nose/Throat: Hearing grossly intact, nares w/o erythema or drainage Eyes: PER, EOMI, sclera nonicteric.  Neck: Supple, no masses.  No bruit or JVD.  Pulmonary:  Good air movement, no audible wheezing, no use of accessory muscles.  Cardiac: RRR, normal S1, S2, no Murmurs. Vascular:  mild trophic changes, no open wounds Vessel Right Left  Radial Palpable Palpable  PT Not Palpable Not Palpable  DP Not Palpable Not Palpable  Gastrointestinal: soft, non-distended. No guarding/no peritoneal signs.  Musculoskeletal: M/S 5/5 throughout.  No visible deformity.  Neurologic: CN 2-12 intact. Pain and light touch intact in extremities.  Symmetrical.  Speech is fluent. Motor exam as listed above. Psychiatric: Judgment intact, Mood & affect appropriate for pt's clinical situation. Dermatologic: No rashes or ulcers noted.  No changes consistent with cellulitis.   CBC Lab Results  Component Value Date   WBC 6.4 09/08/2023   HGB 13.1 09/08/2023   HCT 41.0 09/08/2023   MCV 80 09/08/2023   PLT 203 09/08/2023    BMET    Component Value Date/Time   NA 141 09/08/2023 0947   K 4.6 09/08/2023 0947   CL 108 (H) 09/08/2023 0947   CO2 19 (L) 09/08/2023 0947   GLUCOSE 116 (H) 09/08/2023 0947   BUN 27 (H) 01/20/2024 0721   BUN 23 09/08/2023 0947   CREATININE 1.56 (H) 01/20/2024 0721   CALCIUM 9.2 09/08/2023 0947   GFRNONAA 48 (L) 01/20/2024 0721   GFRAA 49 (L) 02/14/2020 1014   Estimated Creatinine Clearance: 47.9 mL/min (A) (by C-G formula based on SCr of 1.56 mg/dL (H)).  COAG No results found for:  INR, PROTIME  Radiology PERIPHERAL VASCULAR CATHETERIZATION Result Date: 01/20/2024 See surgical note for result.  VAS US  LOWER EXTREMITY ARTERIAL DUPLEX Result Date: 01/01/2024 LOWER EXTREMITY ARTERIAL DUPLEX STUDY Patient Name:  Mike Swallows [facility].  Date of Exam:   01/01/2024 Medical Rec #: [id number]  Accession #:    [id number] Date of Birth: [birth year]                  Patient Gender: M Patient Age:   89 years Exam Location:   [facility] Procedure:      VAS US  LOWER EXTREMITY ARTERIAL DUPLEX Referring Phys: ORVIN DARING --------------------------------------------------------------------------------  Indications: Rest pain.  Current ABI: RT 1.22, Lt .88 Performing Technologist: Leafy Gibes RVS  Examination Guidelines: A complete evaluation includes B-mode imaging, spectral Doppler, color Doppler, and power Doppler as needed of all accessible portions of each vessel. Bilateral testing is considered an integral part of a complete examination. Limited examinations for reoccurring indications may be performed as noted.  +----------+--------+-----+--------+---------+--------+ RIGHT     PSV cm/sRatioStenosisWaveform Comments +----------+--------+-----+--------+---------+--------+ PTA Distal70                   triphasic         +----------+--------+-----+--------+---------+--------+  +-----------+--------+-----+--------+-------------------+--------+ LEFT       PSV cm/sRatioStenosisWaveform           Comments +-----------+--------+-----+--------+-------------------+--------+ CFA Distal 132                  biphasic                    +-----------+--------+-----+--------+-------------------+--------+ DFA        131                  biphasic                    +-----------+--------+-----+--------+-------------------+--------+ SFA Prox   0                    Occluded                     +-----------+--------+-----+--------+-------------------+--------+ SFA Mid    24                   Dampened Monophasic         +-----------+--------+-----+--------+-------------------+--------+ SFA Distal 54                   monophasic                  +-----------+--------+-----+--------+-------------------+--------+ POP Distal 22                   monophasic                  +-----------+--------+-----+--------+-------------------+--------+ ATA Distal 39                   monophasic                  +-----------+--------+-----+--------+-------------------+--------+ PTA Distal 10                   monophasic                  +-----------+--------+-----+--------+-------------------+--------+ PERO Distal31                   monophasic                  +-----------+--------+-----+--------+-------------------+--------+  Summary: Left: Imaging and Waveforms obtained in the Left Lower Extremity. The Left SFA Proximal segment appears to be Occluded; Flow restored in the Mid SFA via collateral flow. Monophasic flow seen from SFA Mid to the level of the Ankle.  See table(s) above  for measurements and observations. Electronically signed by Cordella Shawl MD on 01/01/2024 at 5:03:04 PM.    Final    VAS US  ABI WITH/WO TBI Result Date: 01/01/2024  LOWER EXTREMITY DOPPLER STUDY Patient Name:  Mike [name].  Date of Exam:   01/01/2024 Medical Rec #: [id number]                  Accession #:    [id number] Date of Birth: [birth year]                  Patient Gender: M Patient Age:   34 years Exam Location:  [facility] Procedure:      VAS US  ABI WITH/WO TBI Referring Phys: ORVIN DARING --------------------------------------------------------------------------------  Indications: Rest pain.  Performing Technologist: Leafy Gibes RVS  Examination Guidelines: A complete evaluation includes at minimum, Doppler waveform signals and systolic blood pressure reading at the  level of bilateral brachial, anterior tibial, and posterior tibial arteries, when vessel segments are accessible. Bilateral testing is considered an integral part of a complete examination. Photoelectric Plethysmograph (PPG) waveforms and toe systolic pressure readings are included as required and additional duplex testing as needed. Limited examinations for reoccurring indications may be performed as noted.  ABI Findings: +---------+------------------+-----+---------+--------+ Right    Rt Pressure (mmHg)IndexWaveform Comment  +---------+------------------+-----+---------+--------+ Brachial 130                                      +---------+------------------+-----+---------+--------+ ATA      147               1.07 triphasic         +---------+------------------+-----+---------+--------+ PTA      167               1.22 triphasic         +---------+------------------+-----+---------+--------+ [place] Siemens               0.87 Normal            +---------+------------------+-----+---------+--------+ +---------+------------------+-----+----------+-------+ Left     Lt Pressure (mmHg)IndexWaveform  Comment +---------+------------------+-----+----------+-------+ Brachial 137                                      +---------+------------------+-----+----------+-------+ ATA      104               0.76 monophasic        +---------+------------------+-----+----------+-------+ PTA      121               0.88 monophasic        +---------+------------------+-----+----------+-------+ Great Toe104               0.76 Normal            +---------+------------------+-----+----------+-------+ +-------+-----------+-----------+------------+------------+ ABI/TBIToday's ABIToday's TBIPrevious ABIPrevious TBI +-------+-----------+-----------+------------+------------+ Right  1.22       .88                                  +-------+-----------+-----------+------------+------------+ Left   .87        .76                                 +-------+-----------+-----------+------------+------------+  Summary: Right: Resting right ankle-brachial index is within normal range. The right toe-brachial index is normal. Left: Resting left ankle-brachial index indicates mild left lower extremity arterial disease. The left toe-brachial index is normal. *See table(s) above for measurements and observations.  Electronically signed by Cordella Shawl MD on 01/01/2024 at 5:02:50 PM.    Final      Assessment/Plan There are no diagnoses linked to this encounter.   Cordella Shawl, MD  01/26/2024 10:27 AM

## 2024-01-26 NOTE — Progress Notes (Unsigned)
 MRN : [id number]  Mike [name]. is a 68 y.o. ([birth year]) male who presents with chief complaint of check circulation.  History of Present Illness: ***  Current Meds  Medication Sig   albuterol  (VENTOLIN  HFA) 108 (90 Base) MCG/ACT inhaler INHALE TWO PUFFS BY MOUTH INTO LUNGS EVERY 6 HOURS AS NEEDED FOR WHEEZING OR FOR SHORTNESS OF BREATH   aspirin  EC 81 MG tablet Take 81 mg by mouth daily.    EPINEPHrine  0.3 mg/0.3 mL IJ SOAJ injection Inject 0.3 mg into the muscle as needed for anaphylaxis.   lisinopril  (ZESTRIL ) 40 MG tablet Take 1 tablet (40 mg total) by mouth daily.   metoprolol  tartrate (LOPRESSOR ) 25 MG tablet Take 1 tablet (25 mg total) by mouth 2 (two) times daily.   simvastatin  (ZOCOR ) 40 MG tablet Take 1 tablet (40 mg total) by mouth daily at 6 PM.   tadalafil  (CIALIS ) 20 MG tablet Take 0.5-1 tablets (10-20 mg total) by mouth every other day as needed for erectile dysfunction.   tamsulosin  (FLOMAX ) 0.4 MG CAPS capsule Take 1 capsule (0.4 mg total) by mouth daily.    Past Medical History:  Diagnosis Date   Arthritis    Benign enlargement of prostate    CAD (coronary artery disease)    Chronic kidney disease    History of seizures as a child    Due to head injury   Hyperlipidemia    Hypertension    Lumbago    Myocardial infarction [facility])    Urinary hesitancy     Past Surgical History:  Procedure Laterality Date   COLONOSCOPY WITH PROPOFOL  N/A 10/01/2019   Procedure: COLONOSCOPY WITH PROPOFOL ;  Surgeon: Unk Corinn Skiff, MD;  Location: [facility];  Service: Gastroenterology;  Laterality: N/A;   coronary artery stent     LOWER EXTREMITY ANGIOGRAPHY Left 01/20/2024   Procedure: Lower Extremity Angiography;  Surgeon: Jama Cordella MATSU, MD;  Location: [facility];  Service: Cardiovascular;  Laterality: Left;   LOWER EXTREMITY INTERVENTION Left 01/20/2024   Procedure: LOWER  EXTREMITY INTERVENTION;  Surgeon: Jama Cordella MATSU, MD;  Location: [facility];  Service: Cardiovascular;  Laterality: Left;    Social History Social History   Tobacco Use   Smoking status: Former    Current packs/day: 0.00    Average packs/day: 1 pack/day for 30.0 years (30.0 ttl pk-yrs)    Types: Cigarettes    Start date: 04/12/1986    Quit date: 04/12/2016    Years since quitting: 7.7   Smokeless tobacco: Never   Tobacco comments:    since 15, max 1 ppd x10  Vaping Use   Vaping status: Never Used  Substance Use Topics   Alcohol use: No    Alcohol/week: 0.0 standard drinks of alcohol   Drug use: No    Family History Family History  Problem Relation Age of Onset   Hypertension Mother    Breast cancer Mother    Hypertension Father    Stroke Father    Stroke Maternal Grandfather     Allergies  Allergen Reactions   Shellfish Allergy  Anaphylaxis   Peanuts [Peanut Oil] Rash   Tuna [Fish Allergy] Hives     REVIEW OF SYSTEMS (Negative unless checked)  Constitutional: [] Weight loss  [] Fever  [] Chills Cardiac: [] Chest pain   [] Chest pressure   [] Palpitations   [] Shortness of breath when laying flat   [] Shortness of breath with exertion. Vascular:  [x] Pain in legs with walking   [] Pain in legs at rest  [] History of DVT   [] Phlebitis   [] Swelling in legs   [] Varicose veins   [] Non-healing ulcers Pulmonary:   [] Uses home oxygen   [] Productive cough   [] Hemoptysis   [] Wheeze  [] COPD   [] Asthma Neurologic:  [] Dizziness   [] Seizures   [] History of stroke   [] History of TIA  [] Aphasia   [] Vissual changes   [] Weakness or numbness in arm   [] Weakness or numbness in leg Musculoskeletal:   [] Joint swelling   [] Joint pain   [] Low back pain Hematologic:  [] Easy bruising  [] Easy bleeding   [] Hypercoagulable state   [] Anemic Gastrointestinal:  [] Diarrhea   [] Vomiting  [] Gastroesophageal reflux/heartburn   [] Difficulty swallowing. Genitourinary:  [] Chronic kidney disease    [] Difficult urination  [] Frequent urination   [] Blood in urine Skin:  [] Rashes   [] Ulcers  Psychological:  [] History of anxiety   []  History of major depression.  Physical Examination  Vitals:   01/26/24 0956  BP: (!) 149/71  Pulse: (!) 54  Weight: 213 lb 9.6 oz (96.9 kg)  Height: 5' 5 (1.651 m)   Body mass index is 35.54 kg/m. Gen: WD/WN, NAD Head: [place]/AT, No temporalis wasting.  Ear/Nose/Throat: Hearing grossly intact, nares w/o erythema or drainage Eyes: PER, EOMI, sclera nonicteric.  Neck: Supple, no masses.  No bruit or JVD.  Pulmonary:  Good air movement, no audible wheezing, no use of accessory muscles.  Cardiac: RRR, normal S1, S2, no Murmurs. Vascular:  mild trophic changes, no open wounds Vessel Right Left  Radial Palpable Palpable  PT Not Palpable Not Palpable  DP Not Palpable Not Palpable  Gastrointestinal: soft, non-distended. No guarding/no peritoneal signs.  Musculoskeletal: M/S 5/5 throughout.  No visible deformity.  Neurologic: CN 2-12 intact. Pain and light touch intact in extremities.  Symmetrical.  Speech is fluent. Motor exam as listed above. Psychiatric: Judgment intact, Mood & affect appropriate for pt's clinical situation. Dermatologic: No rashes or ulcers noted.  No changes consistent with cellulitis.   CBC Lab Results  Component Value Date   WBC 6.4 09/08/2023   HGB 13.1 09/08/2023   HCT 41.0 09/08/2023   MCV 80 09/08/2023   PLT 203 09/08/2023    BMET    Component Value Date/Time   NA 141 09/08/2023 0947   K 4.6 09/08/2023 0947   CL 108 (H) 09/08/2023 0947   CO2 19 (L) 09/08/2023 0947   GLUCOSE 116 (H) 09/08/2023 0947   BUN 27 (H) 01/20/2024 0721   BUN 23 09/08/2023 0947   CREATININE 1.56 (H) 01/20/2024 0721   CALCIUM 9.2 09/08/2023 0947   GFRNONAA 48 (L) 01/20/2024 0721   GFRAA 49 (L) 02/14/2020 1014   Estimated Creatinine Clearance: 47.9 mL/min (A) (by C-G formula based on SCr of 1.56 mg/dL (H)).  COAG No results found for:  INR, PROTIME  Radiology PERIPHERAL VASCULAR CATHETERIZATION Result Date: 01/20/2024 See surgical note for result.  VAS US  LOWER EXTREMITY ARTERIAL DUPLEX Result Date: 01/01/2024 LOWER EXTREMITY ARTERIAL DUPLEX STUDY Patient Name:  Kross Swallows [facility].  Date of Exam:   01/01/2024 Medical Rec #: [id number]  Accession #:    [id number] Date of Birth: [birth year]                  Patient Gender: M Patient Age:   89 years Exam Location:   [facility] Procedure:      VAS US  LOWER EXTREMITY ARTERIAL DUPLEX Referring Phys: ORVIN DARING --------------------------------------------------------------------------------  Indications: Rest pain.  Current ABI: RT 1.22, Lt .88 Performing Technologist: Leafy Gibes RVS  Examination Guidelines: A complete evaluation includes B-mode imaging, spectral Doppler, color Doppler, and power Doppler as needed of all accessible portions of each vessel. Bilateral testing is considered an integral part of a complete examination. Limited examinations for reoccurring indications may be performed as noted.  +----------+--------+-----+--------+---------+--------+ RIGHT     PSV cm/sRatioStenosisWaveform Comments +----------+--------+-----+--------+---------+--------+ PTA Distal70                   triphasic         +----------+--------+-----+--------+---------+--------+  +-----------+--------+-----+--------+-------------------+--------+ LEFT       PSV cm/sRatioStenosisWaveform           Comments +-----------+--------+-----+--------+-------------------+--------+ CFA Distal 132                  biphasic                    +-----------+--------+-----+--------+-------------------+--------+ DFA        131                  biphasic                    +-----------+--------+-----+--------+-------------------+--------+ SFA Prox   0                    Occluded                     +-----------+--------+-----+--------+-------------------+--------+ SFA Mid    24                   Dampened Monophasic         +-----------+--------+-----+--------+-------------------+--------+ SFA Distal 54                   monophasic                  +-----------+--------+-----+--------+-------------------+--------+ POP Distal 22                   monophasic                  +-----------+--------+-----+--------+-------------------+--------+ ATA Distal 39                   monophasic                  +-----------+--------+-----+--------+-------------------+--------+ PTA Distal 10                   monophasic                  +-----------+--------+-----+--------+-------------------+--------+ PERO Distal31                   monophasic                  +-----------+--------+-----+--------+-------------------+--------+  Summary: Left: Imaging and Waveforms obtained in the Left Lower Extremity. The Left SFA Proximal segment appears to be Occluded; Flow restored in the Mid SFA via collateral flow. Monophasic flow seen from SFA Mid to the level of the Ankle.  See table(s) above  for measurements and observations. Electronically signed by Cordella Shawl MD on 01/01/2024 at 5:03:04 PM.    Final    VAS US  ABI WITH/WO TBI Result Date: 01/01/2024  LOWER EXTREMITY DOPPLER STUDY Patient Name:  Julias Mould [facility].  Date of Exam:   01/01/2024 Medical Rec #: [id number]                  Accession #:    [id number] Date of Birth: [birth year]                  Patient Gender: M Patient Age:   34 years Exam Location:  [facility] Procedure:      VAS US  ABI WITH/WO TBI Referring Phys: ORVIN DARING --------------------------------------------------------------------------------  Indications: Rest pain.  Performing Technologist: Leafy Gibes RVS  Examination Guidelines: A complete evaluation includes at minimum, Doppler waveform signals and systolic blood pressure reading at the  level of bilateral brachial, anterior tibial, and posterior tibial arteries, when vessel segments are accessible. Bilateral testing is considered an integral part of a complete examination. Photoelectric Plethysmograph (PPG) waveforms and toe systolic pressure readings are included as required and additional duplex testing as needed. Limited examinations for reoccurring indications may be performed as noted.  ABI Findings: +---------+------------------+-----+---------+--------+ Right    Rt Pressure (mmHg)IndexWaveform Comment  +---------+------------------+-----+---------+--------+ Brachial 130                                      +---------+------------------+-----+---------+--------+ ATA      147               1.07 triphasic         +---------+------------------+-----+---------+--------+ PTA      167               1.22 triphasic         +---------+------------------+-----+---------+--------+ Burn[place] Siemens               0.87 Normal            +---------+------------------+-----+---------+--------+ +---------+------------------+-----+----------+-------+ Left     Lt Pressure (mmHg)IndexWaveform  Comment +---------+------------------+-----+----------+-------+ Brachial 137                                      +---------+------------------+-----+----------+-------+ ATA      104               0.76 monophasic        +---------+------------------+-----+----------+-------+ PTA      121               0.88 monophasic        +---------+------------------+-----+----------+-------+ Great Toe104               0.76 Normal            +---------+------------------+-----+----------+-------+ +-------+-----------+-----------+------------+------------+ ABI/TBIToday's ABIToday's TBIPrevious ABIPrevious TBI +-------+-----------+-----------+------------+------------+ Right  1.22       .88                                  +-------+-----------+-----------+------------+------------+ Left   .87        .76                                 +-------+-----------+-----------+------------+------------+  Summary: Right: Resting right ankle-brachial index is within normal range. The right toe-brachial index is normal. Left: Resting left ankle-brachial index indicates mild left lower extremity arterial disease. The left toe-brachial index is normal. *See table(s) above for measurements and observations.  Electronically signed by Cordella Shawl MD on 01/01/2024 at 5:02:50 PM.    Final      Assessment/Plan There are no diagnoses linked to this encounter.   Cordella Shawl, MD  01/26/2024 10:27 AM

## 2024-01-29 ENCOUNTER — Encounter (INDEPENDENT_AMBULATORY_CARE_PROVIDER_SITE_OTHER): Payer: Self-pay | Admitting: Vascular Surgery

## 2024-01-29 DIAGNOSIS — I70229 Atherosclerosis of native arteries of extremities with rest pain, unspecified extremity: Secondary | ICD-10-CM | POA: Insufficient documentation

## 2024-02-05 ENCOUNTER — Telehealth (INDEPENDENT_AMBULATORY_CARE_PROVIDER_SITE_OTHER): Payer: Self-pay

## 2024-02-05 NOTE — Telephone Encounter (Signed)
 Spoke with the patient and he is scheduled with Dr. Jama on 02/24/24 with a 9:00 am arrival time to the [facility] for a left leg angio w/ pedal approach. Pre-procedure instructions were discussed and will be mailed.

## 2024-02-12 ENCOUNTER — Encounter: Payer: Self-pay | Admitting: Family Medicine

## 2024-02-19 ENCOUNTER — Other Ambulatory Visit: Payer: Self-pay | Admitting: Family Medicine

## 2024-02-20 NOTE — Telephone Encounter (Signed)
 Requested Prescriptions  Pending Prescriptions Disp Refills   metoprolol  tartrate (LOPRESSOR ) 25 MG tablet [Pharmacy Med Name: metoprolol  tartrate 25 mg tablet] 180 tablet 0    Sig: TAKE ONE TABLET (25 MG TOTAL) BY MOUTH TWICE DAILY     Cardiovascular:  Beta Blockers Failed - 02/20/2024 11:38 AM      Failed - Last BP in normal range    BP Readings from Last 1 Encounters:  01/26/24 (!) 149/71         Passed - Last Heart Rate in normal range    Pulse Readings from Last 1 Encounters:  01/26/24 (!) 54         Passed - Valid encounter within last 6 months    Recent Outpatient Visits           5 months ago Routine general medical examination at a health care facility   [facility] [place] P, DO               tamsulosin  (FLOMAX ) 0.4 MG CAPS capsule [Pharmacy Med Name: tamsulosin  0.4 mg capsule] 90 capsule 0    Sig: TAKE ONE CAPSULE (0.4 MG TOTAL) BY MOUTH DAILY     Urology: Alpha-Adrenergic Blocker Failed - 02/20/2024 11:38 AM      Failed - Last BP in normal range    BP Readings from Last 1 Encounters:  01/26/24 (!) 149/71         Passed - PSA in normal range and within 360 days    Prostate Specific Ag, Serum  Date Value Ref Range Status  09/08/2023 0.5 0.0 - 4.0 ng/mL Final    Comment:    Roche ECLIA methodology. According to the American Urological Association, Serum PSA should decrease and remain at undetectable levels after radical prostatectomy. The AUA defines biochemical recurrence as an initial PSA value 0.2 ng/mL or greater followed by a subsequent confirmatory PSA value 0.2 ng/mL or greater. Values obtained with different assay methods or kits cannot be used interchangeably. Results cannot be interpreted as absolute evidence of the presence or absence of malignant disease.          Passed - Valid encounter within last 12 months    Recent Outpatient Visits           5 months ago Routine general medical examination at a  health care facility   [facility] [place] P, DO               simvastatin  (ZOCOR ) 40 MG tablet [Pharmacy Med Name: simvastatin  40 mg tablet] 90 tablet 0    Sig: TAKE ONE TABLET (40 MG TOTAL) BY MOUTH DAILY AT 6PM     Cardiovascular:  Antilipid - Statins Failed - 02/20/2024 11:38 AM      Failed - Lipid Panel in normal range within the last 12 months    Cholesterol, Total  Date Value Ref Range Status  09/08/2023 167 100 - 199 mg/dL Final   Cholesterol Piccolo, Waived  Date Value Ref Range Status  04/02/2016 142 <200 mg/dL Final    Comment:                            Desirable                <200  Borderline High      200- 239                         High                     >239    LDL Chol Calc (NIH)  Date Value Ref Range Status  09/08/2023 98 0 - 99 mg/dL Final   HDL  Date Value Ref Range Status  09/08/2023 57 >39 mg/dL Final   Triglycerides  Date Value Ref Range Status  09/08/2023 64 0 - 149 mg/dL Final   Triglycerides Piccolo,Waived  Date Value Ref Range Status  04/02/2016 53 <150 mg/dL Final    Comment:                            Normal                   <150                         Borderline High     150 - 199                         High                200 - 499                         Very High                >499          Passed - Patient is not pregnant      Passed - Valid encounter within last 12 months    Recent Outpatient Visits           5 months ago Routine general medical examination at a health care facility   [facility], [place] P, DO

## 2024-02-24 ENCOUNTER — Encounter: Payer: Self-pay | Admitting: Vascular Surgery

## 2024-02-24 ENCOUNTER — Telehealth: Payer: Self-pay

## 2024-02-24 ENCOUNTER — Ambulatory Visit
Admission: RE | Admit: 2024-02-24 | Discharge: 2024-02-24 | Disposition: A | Discharge status: Home / Self Care | Attending: Vascular Surgery | Admitting: Vascular Surgery

## 2024-02-24 ENCOUNTER — Encounter
Admission: RE | Disposition: A | Discharge status: Home / Self Care | Payer: Self-pay | Source: Home / Self Care | Attending: Vascular Surgery

## 2024-02-24 DIAGNOSIS — Z87891 Personal history of nicotine dependence: Secondary | ICD-10-CM | POA: Insufficient documentation

## 2024-02-24 DIAGNOSIS — I2510 Atherosclerotic heart disease of native coronary artery without angina pectoris: Secondary | ICD-10-CM | POA: Diagnosis not present

## 2024-02-24 DIAGNOSIS — J449 Chronic obstructive pulmonary disease, unspecified: Secondary | ICD-10-CM | POA: Diagnosis not present

## 2024-02-24 DIAGNOSIS — I70229 Atherosclerosis of native arteries of extremities with rest pain, unspecified extremity: Secondary | ICD-10-CM | POA: Diagnosis present

## 2024-02-24 DIAGNOSIS — E782 Mixed hyperlipidemia: Secondary | ICD-10-CM | POA: Diagnosis not present

## 2024-02-24 DIAGNOSIS — I70222 Atherosclerosis of native arteries of extremities with rest pain, left leg: Secondary | ICD-10-CM | POA: Diagnosis not present

## 2024-02-24 DIAGNOSIS — Z79899 Other long term (current) drug therapy: Secondary | ICD-10-CM | POA: Insufficient documentation

## 2024-02-24 HISTORY — PX: LOWER EXTREMITY ANGIOGRAPHY: CATH118251

## 2024-02-24 HISTORY — PX: LOWER EXTREMITY INTERVENTION: CATH118252

## 2024-02-24 LAB — CREATININE, SERUM
Creatinine, Ser: 1.7 mg/dL — ABNORMAL HIGH (ref 0.61–1.24)
GFR, Estimated: 43 mL/min — ABNORMAL LOW (ref >60)

## 2024-02-24 LAB — BUN: BUN: 34 mg/dL — ABNORMAL HIGH (ref 8–23)

## 2024-02-24 SURGERY — LOWER EXTREMITY INTERVENTION
Anesthesia: Moderate Sedation | Site: Leg Lower | Laterality: Left

## 2024-02-24 MED ORDER — CLOPIDOGREL BISULFATE 75 MG PO TABS
ORAL_TABLET | ORAL | Status: AC
  Filled 2024-02-24: qty 4

## 2024-02-24 MED ORDER — HEPARIN SODIUM (PORCINE) 1000 UNIT/ML IJ SOLN
INTRAMUSCULAR | Status: DC | PRN
  Administered 2024-02-24: 6000 [IU] via INTRAVENOUS

## 2024-02-24 MED ORDER — CEFAZOLIN SODIUM-DEXTROSE 2-4 GM/100ML-% IV SOLN
INTRAVENOUS | Status: AC
  Filled 2024-02-24: qty 100

## 2024-02-24 MED ORDER — METHYLPREDNISOLONE SODIUM SUCC 125 MG IJ SOLR
INTRAMUSCULAR | Status: AC
  Filled 2024-02-24: qty 2

## 2024-02-24 MED ORDER — MIDAZOLAM HCL 5 MG/5ML IJ SOLN
INTRAMUSCULAR | Status: AC
  Filled 2024-02-24: qty 5

## 2024-02-24 MED ORDER — SODIUM CHLORIDE 0.9 % IV SOLN: 250.0000 mL | INTRAVENOUS | Status: DC | PRN

## 2024-02-24 MED ORDER — FENTANYL CITRATE (PF) 100 MCG/2ML IJ SOLN
INTRAMUSCULAR | Status: DC | PRN
  Administered 2024-02-24 (×4): 50 ug via INTRAVENOUS

## 2024-02-24 MED ORDER — FENTANYL CITRATE (PF) 100 MCG/2ML IJ SOLN
INTRAMUSCULAR | Status: AC
  Filled 2024-02-24: qty 2

## 2024-02-24 MED ORDER — OXYCODONE HCL 5 MG PO TABS: 5.0000 mg | ORAL_TABLET | ORAL | Status: DC | PRN

## 2024-02-24 MED ORDER — SODIUM CHLORIDE 0.9 % IV SOLN: INTRAVENOUS | Status: DC

## 2024-02-24 MED ORDER — HEPARIN SODIUM (PORCINE) 1000 UNIT/ML IJ SOLN
INTRAMUSCULAR | Status: AC
  Filled 2024-02-24: qty 10

## 2024-02-24 MED ORDER — LIDOCAINE HCL (PF) 1 % IJ SOLN
INTRAMUSCULAR | Status: DC | PRN
  Administered 2024-02-24: 5 mL via INTRADERMAL
  Administered 2024-02-24: 10 mL via INTRADERMAL

## 2024-02-24 MED ORDER — CEFAZOLIN SODIUM-DEXTROSE 2-4 GM/100ML-% IV SOLN
2.0000 g | INTRAVENOUS | Status: AC
  Administered 2024-02-24: 2 g via INTRAVENOUS

## 2024-02-24 MED ORDER — ACETAMINOPHEN 325 MG PO TABS: 650.0000 mg | ORAL_TABLET | ORAL | Status: DC | PRN

## 2024-02-24 MED ORDER — FAMOTIDINE 20 MG PO TABS
40.0000 mg | ORAL_TABLET | Freq: Once | ORAL | Status: AC | PRN
  Administered 2024-02-24: 40 mg via ORAL

## 2024-02-24 MED ORDER — ONDANSETRON HCL 4 MG/2ML IJ SOLN: 4.0000 mg | Freq: Four times a day (QID) | INTRAMUSCULAR | Status: DC | PRN

## 2024-02-24 MED ORDER — HYDROMORPHONE HCL 1 MG/ML IJ SOLN: 1.0000 mg | Freq: Once | INTRAMUSCULAR | Status: DC | PRN

## 2024-02-24 MED ORDER — MORPHINE SULFATE (PF) 2 MG/ML IV SOLN: 2.0000 mg | INTRAVENOUS | Status: DC | PRN

## 2024-02-24 MED ORDER — HYDRALAZINE HCL 20 MG/ML IJ SOLN: 5.0000 mg | INTRAMUSCULAR | Status: DC | PRN

## 2024-02-24 MED ORDER — METHYLPREDNISOLONE SODIUM SUCC 125 MG IJ SOLR
125.0000 mg | Freq: Once | INTRAMUSCULAR | Status: AC | PRN
  Administered 2024-02-24: 125 mg via INTRAVENOUS

## 2024-02-24 MED ORDER — SODIUM CHLORIDE 0.9 % IV SOLN
INTRAVENOUS | Status: DC
  Administered 2024-02-24: 250 mL via INTRAVENOUS

## 2024-02-24 MED ORDER — SODIUM CHLORIDE 0.9% FLUSH: 3.0000 mL | INTRAVENOUS | Status: DC | PRN

## 2024-02-24 MED ORDER — SODIUM CHLORIDE 0.9% FLUSH: 3.0000 mL | Freq: Two times a day (BID) | INTRAVENOUS | Status: DC

## 2024-02-24 MED ORDER — HEPARIN (PORCINE) IN NACL 1000-0.9 UT/500ML-% IV SOLN
INTRAVENOUS | Status: DC | PRN
  Administered 2024-02-24: 1000 mL

## 2024-02-24 MED ORDER — LABETALOL HCL 5 MG/ML IV SOLN: 10.0000 mg | INTRAVENOUS | Status: DC | PRN

## 2024-02-24 MED ORDER — FAMOTIDINE 20 MG PO TABS
ORAL_TABLET | ORAL | Status: AC
  Filled 2024-02-24: qty 2

## 2024-02-24 MED ORDER — CLOPIDOGREL BISULFATE 75 MG PO TABS: 75.0000 mg | ORAL_TABLET | Freq: Every day | ORAL | 5 refills | Status: DC

## 2024-02-24 MED ORDER — MIDAZOLAM HCL 2 MG/2ML IJ SOLN
INTRAMUSCULAR | Status: DC | PRN
  Administered 2024-02-24 (×2): 1 mg via INTRAVENOUS
  Administered 2024-02-24: 2 mg via INTRAVENOUS
  Administered 2024-02-24: 1 mg via INTRAVENOUS

## 2024-02-24 MED ORDER — DIPHENHYDRAMINE HCL 50 MG/ML IJ SOLN
INTRAMUSCULAR | Status: AC
  Filled 2024-02-24: qty 1

## 2024-02-24 MED ORDER — MIDAZOLAM HCL 2 MG/ML PO SYRP: 8.0000 mg | ORAL_SOLUTION | Freq: Once | ORAL | Status: DC | PRN

## 2024-02-24 MED ORDER — IODIXANOL 320 MG/ML IV SOLN
INTRAVENOUS | Status: DC | PRN
  Administered 2024-02-24: 75 mL

## 2024-02-24 MED ORDER — CLOPIDOGREL BISULFATE 300 MG PO TABS
300.0000 mg | ORAL_TABLET | ORAL | Status: AC
Start: 2024-02-24 — End: 2024-02-24
  Administered 2024-02-24: 300 mg via ORAL

## 2024-02-24 MED ORDER — DIPHENHYDRAMINE HCL 50 MG/ML IJ SOLN
50.0000 mg | Freq: Once | INTRAMUSCULAR | Status: AC | PRN
  Administered 2024-02-24: 50 mg via INTRAVENOUS

## 2024-02-24 SURGICAL SUPPLY — 31 items
BALLOON JADE .014 3.5 X 40 (BALLOONS) IMPLANT
BALLOON JADE .014 4.0 X 40 0 (BALLOONS) IMPLANT
BALLOON LUTONIX 4X220X130 (BALLOONS) IMPLANT
BALLOON LUTONIX 5X220X130 (BALLOONS) IMPLANT
BALLOON LUTONIX DCB 6X100X130 (BALLOONS) IMPLANT
BALLOON LUTONIX DCB 6X80X130 (BALLOONS) IMPLANT
CATH BEACON 5 .035 65 KMP TIP (CATHETERS) IMPLANT
CATH BEACON 5 .038 100 VERT TP (CATHETERS) IMPLANT
CATH SEEKER .035X150CM (CATHETERS) IMPLANT
CATH TEMPO 5F RIM 65CM (CATHETERS) IMPLANT
COVER PROBE ULTRASOUND 5X96 (MISCELLANEOUS) IMPLANT
DEVICE PRESTO INFLATION (MISCELLANEOUS) IMPLANT
DEVICE RAD COMP TR BAND LRG (VASCULAR PRODUCTS) IMPLANT
DEVICE STARCLOSE SE CLOSURE (Vascular Products) IMPLANT
DRAPE INCISE IOBAN 66X45 STRL (DRAPES) IMPLANT
GLIDEWIRE ADV .035X180CM (WIRE) IMPLANT
GLIDEWIRE ADV .035X260CM (WIRE) IMPLANT
GOWN STRL REUS W/ TWL LRG LVL3 (GOWN DISPOSABLE) ×1 IMPLANT
LIFESTENT SOLO 6X200X135 (Permanent Stent) IMPLANT
NDL ENTRY 21GA 7CM ECHOTIP (NEEDLE) IMPLANT
NEEDLE ENTRY 21GA 7CM ECHOTIP (NEEDLE) ×1 IMPLANT
PACK ANGIOGRAPHY (CUSTOM PROCEDURE TRAY) ×1 IMPLANT
SHEATH ANL2 6FRX45 HC (SHEATH) IMPLANT
SHEATH HALO 035 5FRX10 (SHEATH) IMPLANT
SHEATH MICROPUNCTURE PEDAL 4FR (SHEATH) IMPLANT
STENT LIFESTENT 5F 7X30X135 (Permanent Stent) IMPLANT
STENT LIFESTENT 5F 7X80X135 (Permanent Stent) IMPLANT
STENT OTW ESPRIT BTK 3.75X38 (Permanent Stent) IMPLANT
WIRE COMMAND ST STR 014 300 (WIRE) IMPLANT
WIRE J 3MM .035X145CM (WIRE) IMPLANT
WIRE MAGIC TORQUE 315CM (WIRE) IMPLANT

## 2024-02-24 NOTE — Interval H&P Note (Signed)
 History and Physical Interval Note:  02/24/2024 9:16 AM  Mike Earnest Virts Jr.  has presented today for surgery, with the diagnosis of LLE Angio  w pedal approach     ASO w rest pain.  The various methods of treatment have been discussed with the patient and family. After consideration of risks, benefits and other options for treatment, the patient has consented to  Procedure(s): LOWER EXTREMITY INTERVENTION (Left) Lower Extremity Angiography (Left) as a surgical intervention.  The patient's history has been reviewed, patient examined, no change in status, stable for surgery.  I have reviewed the patient's chart and labs.  Questions were answered to the patient's satisfaction.     Cordella Shawl

## 2024-02-24 NOTE — Telephone Encounter (Unsigned)
 Copied from CRM #[id number]. Topic: Clinical - Prescription Issue >> Feb 24, 2024  4:36 PM Delon DASEN wrote: Reason for CRM: Need new prescriptions sent through [facility]- they were sent to [facility]

## 2024-02-24 NOTE — Op Note (Signed)
 [facility]  Percutaneous Study/Intervention Procedural Note   Date of Surgery: 02/24/2024  Surgeon:  Mike Shawl  Pre-operative Diagnosis: Atherosclerotic occlusive disease bilateral lower extremities with left lower extremity with rest pain and ulceration.  Post-operative diagnosis:  Same  Procedure(s) Performed:             1.  Introduction catheter into left lower extremity 3rd order catheter placement               2.    Contrast injection left lower extremity for distal runoff             3.  Percutaneous transluminal angioplasty and stent placement superficial femoral and popliteal arteries to 6 mm             4.  Percutaneous transluminal angioplasty and stent placement left anterior tibial to 4 mm             5.  Star close closure right common femoral arteriotomy             6.   Ultrasound-guided access to the left anterior tibial at the level of the ankle  Anesthesia: Conscious sedation was administered under my direct supervision by the interventional radiology RN. IV Versed  plus fentanyl  were utilized. Continuous ECG, pulse oximetry and blood pressure was monitored throughout the entire procedure.  Conscious sedation was for a total of 115 minutes.  Sheath: 6 French halo left anterior tibial retrograde and a 6 [place] Ansell right common femoral retrograde  Contrast: 75 cc  Fluoroscopy Time: 25.9 minutes  Indications:  Mike [name]. presents with increasing pain of the left lower extremity.  He has a known SFA occlusion as well as advanced tibial disease, this suggests the patient is having limb threatening ischemia. The risks and benefits are reviewed all questions answered patient agrees to proceed.  Procedure:   Mike Norfleet Morillo Mickey. is a 68 y.o. y.o. male who was identified and appropriate procedural time out was performed.  The patient was then placed supine on the table and prepped and draped in the usual sterile fashion.     Ultrasound was placed in the sterile sleeve and the left anterior tibial artery was imaged.  It was noted to be echolucent pulsatile indicating patency.  1% lidocaine  was infiltrated and soft tissue image was recorded for the permanent record and using a micropuncture needle access was obtained to the anterior tibial artery under direct ultrasound visualization.  Microwire was advanced without difficulty and confirmed with fluoroscopy.  Microsheath was then advanced followed by a 0.035 advantage wire which was negotiated up to the level of the knee under fluoroscopic guidance.  A 6 French halo sheath was then placed.  Hand-injection of contrast then demonstrated the anterior tibial anatomy as well as the mid to distal popliteal artery anatomy.  Greater than 90% stenosis in the proximal anterior tibial beginning at the origin was identified.  Working with the advantage wire first 65 cm Kumpe and then a 100 cm Kumpe catheter I was able to negotiate the catheter up to the level of the femoral bifurcation but could not reenter into the true lumen of the proximal superficial femoral artery.  At this point I elected to start a sheath in the right common femoral.  The ultrasound was reprepped and draped in a sterile fashion.  The right common femoral was imaged it was echolucent pulsatile indicating patency.  Images recorded for the permanent record.  Lidocaine  is  infiltrated in the soft tissues and using a micropuncture needle the common femoral artery is accessed.  Microwire followed by micro sheath was then inserted.  Advantage wire was then advanced under fluoroscopic guidance followed by an 6 [place] Ansell.  Sheath was positioned with its tip in the mid right common iliac artery and a rim catheter was advanced over the wire and used to hook the aortic bifurcation.  Wire was then advanced down into the left side followed by the catheter and then the sheath.  6000 units of heparin  was then given and allowed to  circulate for several minutes.    I having left the 100 cm Kumpe catheter in the proximal SFA I then used the 65 cm Kumpe catheter in association with an advantage wire and negotiated the wire and catheter down into the SFA occlusion.  I was able to match the Kumpe catheters and then advance the wire from the 65 cm Kumpe into the 100 cm Kumpe and then advance the wire out the sheath so that the tip of the advantage wire was now extracorporeal at the ankle level.  The 2 Kumpe catheters were removed and a 0.035 150 cm catheter was then advanced from the right groin all the way down into the left ankle sheath.  The advantage wire was then removed and a 315 cm Magic torque wire was advanced from the right groin down and out the left ankle sheath.  I now had complete control of both ends of the wire.  Imaging through the [facility] sheath now demonstrated the SFA occlusion and the above-knee popliteal reconstitution at Hunter's canal.  I selected a 4 mm x 220 mm Lutonix drug-eluting balloon and a balloon angioplasty was performed of the SFA and proximal above-knee popliteal.  Inflation was to 10 atm for approximately 1 minute.  Follow-up imaging demonstrated multiple areas of greater than 70% stenosis and I subselected a 6 mm x 200 mm life stent this was advanced from the right groin down to the above-knee popliteal deployed without difficulty.  It was postdilated with a 5 mm x 220 mm Lutonix drug-eluting balloon inflated to 10 atm for approximately 1 minute.  Next a very steep LAO projection of the femoral bifurcation was obtained.  The wire was adjusted and a 7 mm x 80 mm life stent was advanced from the ankle sheath up to the femoral bifurcation.  It was deployed so that its leading edge was right at the origin of the SFA.  This was postdilated with a 6 mm x 80 mm Lutonix drug-eluting balloon inflated to 10 atm for 1 minute.  Follow-up imaging demonstrated perfect positioning of the proximal life stent.  At this point  I reintroduced the dilator into the [facility] sheath and was attempting to advance the dilator sheath combination so that it would be seated within the life stents however secondary to poor image quality and patient motion the dilator appeared to hook on the leading edge of the stent and pushed the stent distal.  This then uncovered approximately 1 cm of the origin of the SFA and appeared to deform the stent.  Given this finding I once again performed steep LAO with magnified imaging.  Working from the ankle sheath a 7 mm x 30 mm life stent was again deployed with the leading edge of a stent right at the origin of the SFA.  A 6 mm x 100 mm Lutonix drug-eluting balloon was then advanced across the proximal SFA and inflated to  10 atm for 1 minute.  2 separate inflations were made.  Follow-up imaging now demonstrated wide patency of the SFA from its origin down to the popliteal popliteal was widely patent down to the origin of the anterior tibial.  There was less than 10% residual stenosis  At this point I elected to complete the case working from the ankle sheath.  A Kumpe catheter was then advanced up from the ankle and once it was in the common femoral the Magic torque wire was removed and a 0.014 command wire was advanced and positioned with its tip in the abdominal aorta.  Magnified imaging was then obtained of the origin of the anterior tibial.  A 3.5 mm Jade balloon was then used to predilate the proximal portion of the anterior tibial inflation.  Next a 3.75 x 38 mm Esprit stent was advanced across the this lesion extending into the popliteal artery by approximately 10 mm.  It was then postdilated with a 4 mm x 40 mm Jade balloon initial inflation was only to 4 to 6 atm for a approximate diameter of 3.7.  The balloon was then deflated repositioned more proximally and inflated to 18 atm to better match the popliteal portion of the stent.  Follow-up imaging demonstrated wide patency of the anterior tibial with  less than 10% residual stenosis  After review of these images the sheath is pulled into the right external iliac oblique of the common femoral is obtained and a Star close device deployed.  The ankle sheath was then removed and a TR band applied.  There no immediate Complications.  Findings: The abdominal aorta and iliac arteries had recently been imaged.  The left common femoral is patent as is the profunda femoris.  The SFA does indeed have an occlusion with reconstitution of the above-knee popliteal at Hunter's canal.  The popliteal is otherwise patent and free of hemodynamically significant stenosis.  The trifurcation is profoundly diseased with occlusion of the tibioperoneal trunk and nonvisualization of the peroneal and the posterior tibial throughout their course.  The anterior tibial demonstrates a greater than 90% stenosis at its origin which extends approximately 20 mm into the artery.  Distal to this lesion although there is diffuse disease there does not appear to be a hemodynamically significant stenosis.  The anterior tibial does not feel the dorsalis pedis.    Following angioplasty and stent placement the anterior tibial now is in-line flow and looks quite nice with less than 10% residual stenosis. Angioplasty and stent placement of the SFA from the origin down to  Hunter's canal yields an excellent result with less than 10% residual stenosis.  Summary: Successful recanalization left lower extremity for limb salvage                           Disposition: Patient was taken to the recovery room in stable condition having tolerated the procedure well.  Mike [name] 02/24/2024,12:10 PM

## 2024-02-25 ENCOUNTER — Other Ambulatory Visit: Payer: Self-pay

## 2024-02-25 ENCOUNTER — Encounter: Payer: Self-pay | Admitting: Vascular Surgery

## 2024-02-25 MED ORDER — CLOPIDOGREL BISULFATE 75 MG PO TABS: 75.0000 mg | ORAL_TABLET | Freq: Every day | ORAL | 1 refills | Status: AC

## 2024-02-25 NOTE — Telephone Encounter (Signed)
 Request sent to provider.

## 2024-03-10 ENCOUNTER — Ambulatory Visit (INDEPENDENT_AMBULATORY_CARE_PROVIDER_SITE_OTHER): Admitting: Family Medicine

## 2024-03-10 ENCOUNTER — Encounter: Payer: Self-pay | Admitting: Family Medicine

## 2024-03-10 VITALS — BP 110/56 | HR 71 | Temp 97.7°F | Wt 209.6 lb

## 2024-03-10 DIAGNOSIS — Z23 Encounter for immunization: Secondary | ICD-10-CM | POA: Diagnosis not present

## 2024-03-10 DIAGNOSIS — I70223 Atherosclerosis of native arteries of extremities with rest pain, bilateral legs: Secondary | ICD-10-CM | POA: Diagnosis not present

## 2024-03-10 DIAGNOSIS — E782 Mixed hyperlipidemia: Secondary | ICD-10-CM | POA: Diagnosis not present

## 2024-03-10 DIAGNOSIS — I739 Peripheral vascular disease, unspecified: Secondary | ICD-10-CM | POA: Diagnosis not present

## 2024-03-10 DIAGNOSIS — R3911 Hesitancy of micturition: Secondary | ICD-10-CM | POA: Diagnosis not present

## 2024-03-10 DIAGNOSIS — N401 Enlarged prostate with lower urinary tract symptoms: Secondary | ICD-10-CM | POA: Diagnosis not present

## 2024-03-10 DIAGNOSIS — I129 Hypertensive chronic kidney disease with stage 1 through stage 4 chronic kidney disease, or unspecified chronic kidney disease: Secondary | ICD-10-CM

## 2024-03-10 DIAGNOSIS — R252 Cramp and spasm: Secondary | ICD-10-CM

## 2024-03-10 NOTE — Assessment & Plan Note (Signed)
Will keep BP and cholesterol under good control. Continue to follow with cardiology. Call with any concerns.  

## 2024-03-10 NOTE — Assessment & Plan Note (Signed)
 Under good control on current regimen. Continue current regimen. Continue to monitor. Call with any concerns. Refills given. Labs drawn today.

## 2024-03-10 NOTE — Progress Notes (Signed)
 BP (!) 110/56   Pulse 71   Temp 97.7 F (36.5 C) (Oral)   Wt 209 lb 9.6 oz (95.1 kg)   SpO2 97%   BMI 34.88 kg/m    Subjective:    Patient ID: Mike Guadlupe Dominic Mickey., male    DOB: Dec 16, 1955, 68 y.o.   MRN: [id number]  HPI: Mike Tumbleson Oconnor Mickey. is a 68 y.o. male  Chief Complaint  Patient presents with   Hypertension   Coronary Artery Disease   COPD   Hyperlipidemia   Had to have stenting for PVD 2 weeks ago. Still recovering.  HYPERTENSION / HYPERLIPIDEMIA Satisfied with current treatment? yes Duration of hypertension: chronic BP monitoring frequency: not checking BP medication side effects: no Past BP meds: lisinopril , metoprolol  Duration of hyperlipidemia: chronic Cholesterol medication side effects: no Cholesterol supplements: none Past cholesterol medications: simvastatin  Medication compliance: excellent compliance Aspirin : yes Recent stressors: no Recurrent headaches: no Visual changes: no Palpitations: no Dyspnea: no Chest pain: no Lower extremity edema: no Dizzy/lightheaded: no  BPH BPH status: controlled Satisfied with current treatment?: yes Medication side effects: no Medication compliance: excellent compliance Duration: chronic Nocturia: 1-2x per night Urinary frequency:no Incomplete voiding: no Urgency: no Weak urinary stream: no Straining to start stream: no Dysuria: no Onset: gradual Severity: mild  Relevant past medical, surgical, family and social history reviewed and updated as indicated. Interim medical history since our last visit reviewed. Allergies and medications reviewed and updated.  Review of Systems  Constitutional: Negative.   Respiratory: Negative.    Cardiovascular: Negative.   Musculoskeletal:  Positive for myalgias. Negative for arthralgias, back pain, gait problem, joint swelling, neck pain and neck stiffness.  Skin: Negative.   Neurological: Negative.   Psychiatric/Behavioral: Negative.      Per HPI  unless specifically indicated above     Objective:    BP (!) 110/56   Pulse 71   Temp 97.7 F (36.5 C) (Oral)   Wt 209 lb 9.6 oz (95.1 kg)   SpO2 97%   BMI 34.88 kg/m   Wt Readings from Last 3 Encounters:  03/10/24 209 lb 9.6 oz (95.1 kg)  02/24/24 209 lb 6.4 oz (95 kg)  01/26/24 213 lb 9.6 oz (96.9 kg)    Physical Exam Vitals and nursing note reviewed.  Constitutional:      General: He is not in acute distress.    Appearance: Normal appearance. He is not ill-appearing, toxic-appearing or diaphoretic.  HENT:     Head: Normocephalic and atraumatic.     Right Ear: External ear normal.     Left Ear: External ear normal.     Nose: Nose normal.     Mouth/Throat:     Mouth: Mucous membranes are moist.     Pharynx: Oropharynx is clear.  Eyes:     General: No scleral icterus.       Right eye: No discharge.        Left eye: No discharge.     Extraocular Movements: Extraocular movements intact.     Conjunctiva/sclera: Conjunctivae normal.     Pupils: Pupils are equal, round, and reactive to light.  Cardiovascular:     Rate and Rhythm: Normal rate and regular rhythm.     Pulses: Normal pulses.     Heart sounds: Normal heart sounds. No murmur heard.    No friction rub. No gallop.  Pulmonary:     Effort: Pulmonary effort is normal. No respiratory distress.     Breath sounds: Normal breath  sounds. No stridor. No wheezing, rhonchi or rales.  Chest:     Chest wall: No tenderness.  Musculoskeletal:        General: Normal range of motion.     Cervical back: Normal range of motion and neck supple.  Skin:    General: Skin is warm and dry.     Capillary Refill: Capillary refill takes less than 2 seconds.     Coloration: Skin is not jaundiced or pale.     Findings: No bruising, erythema, lesion or rash.  Neurological:     General: No focal deficit present.     Mental Status: He is alert and oriented to person, place, and time. Mental status is at baseline.  Psychiatric:         Mood and Affect: Mood normal.        Behavior: Behavior normal.        Thought Content: Thought content normal.        Judgment: Judgment normal.     Results for orders placed or performed during the hospital encounter of 02/24/24  BUN   Collection Time: 02/24/24  9:27 AM  Result Value Ref Range   BUN 34 (H) 8 - 23 mg/dL  Creatinine, serum   Collection Time: 02/24/24  9:27 AM  Result Value Ref Range   Creatinine, Ser 1.70 (H) 0.61 - 1.24 mg/dL   GFR, Estimated 43 (L) >60 mL/min      Assessment & Plan:   Problem List Items Addressed This Visit       Cardiovascular and Mediastinum   Atherosclerosis of native arteries of extremity with rest pain (HCC)   Will keep BP and cholesterol under good control. Continue to follow with cardiology. Call with any concerns.       Relevant Orders   CBC with Differential/Platelet     Genitourinary   Benign hypertensive renal disease - Primary   Under good control on current regimen. Continue current regimen. Continue to monitor. Call with any concerns. Refills given. Labs drawn today.       Relevant Orders   CBC with Differential/Platelet   Comprehensive metabolic panel with GFR     Other   HLD (hyperlipidemia)   Under good control on current regimen. Continue current regimen. Continue to monitor. Call with any concerns. Refills given. Labs drawn today.      Relevant Orders   CBC with Differential/Platelet   Comprehensive metabolic panel with GFR   Lipid Panel w/o Chol/HDL Ratio   Urinary hesitancy due to benign prostatic hyperplasia   Under good control on current regimen. Continue current regimen. Continue to monitor. Call with any concerns. Refills given. Labs drawn today.      Relevant Orders   CBC with Differential/Platelet   Comprehensive metabolic panel with GFR   PSA   Other Visit Diagnoses       Muscle cramp       Will check labs. Await results. Continue hydration.   Relevant Orders   CBC with  Differential/Platelet   Comprehensive metabolic panel with GFR   TSH   Magnesium   Phosphorus   VITAMIN D 25 Hydroxy (Vit-D Deficiency, Fractures)     Flu vaccine need       Flu shot given today.   Relevant Orders   Flu vaccine HIGH DOSE PF(Fluzone Trivalent) (Completed)     Need for COVID-19 vaccine       COVID shot given today.   Relevant Orders   Pfizer Comirnaty Covid -19  Vaccine 40yrs and older (Completed)        Follow up plan: Return in about 6 months (around 09/08/2024) for physical.

## 2024-03-11 ENCOUNTER — Ambulatory Visit: Admitting: Family Medicine

## 2024-03-11 ENCOUNTER — Ambulatory Visit: Payer: Self-pay | Admitting: Family Medicine

## 2024-03-11 DIAGNOSIS — N289 Disorder of kidney and ureter, unspecified: Secondary | ICD-10-CM

## 2024-03-11 LAB — COMPREHENSIVE METABOLIC PANEL WITH GFR
ALT: 23 IU/L (ref 0–44)
AST: 38 IU/L (ref 0–40)
Albumin: 4 g/dL (ref 3.9–4.9)
Alkaline Phosphatase: 135 IU/L — ABNORMAL HIGH (ref 47–123)
BUN/Creatinine Ratio: 19 (ref 10–24)
BUN: 35 mg/dL — ABNORMAL HIGH (ref 8–27)
Bilirubin Total: 0.6 mg/dL (ref 0.0–1.2)
CO2: 21 mmol/L (ref 20–29)
Calcium: 9.6 mg/dL (ref 8.6–10.2)
Chloride: 100 mmol/L (ref 96–106)
Creatinine, Ser: 1.88 mg/dL — ABNORMAL HIGH (ref 0.76–1.27)
Globulin, Total: 2.8 g/dL (ref 1.5–4.5)
Glucose: 130 mg/dL — ABNORMAL HIGH (ref 70–99)
Potassium: 4.7 mmol/L (ref 3.5–5.2)
Sodium: 137 mmol/L (ref 134–144)
Total Protein: 6.8 g/dL (ref 6.0–8.5)
eGFR: 38 mL/min/1.73 — ABNORMAL LOW (ref >59)

## 2024-03-11 LAB — CBC WITH DIFFERENTIAL/PLATELET
Basophils Absolute: 0.1 x10E3/uL (ref 0.0–0.2)
Basos: 1 %
EOS (ABSOLUTE): 0.5 x10E3/uL — ABNORMAL HIGH (ref 0.0–0.4)
Eos: 5 %
Hematocrit: 41.3 % (ref 37.5–51.0)
Hemoglobin: 13.5 g/dL (ref 13.0–17.7)
Immature Grans (Abs): 0.1 x10E3/uL (ref 0.0–0.1)
Immature Granulocytes: 1 %
Lymphocytes Absolute: 2 x10E3/uL (ref 0.7–3.1)
Lymphs: 22 %
MCH: 26 pg — ABNORMAL LOW (ref 26.6–33.0)
MCHC: 32.7 g/dL (ref 31.5–35.7)
MCV: 80 fL (ref 79–97)
Monocytes Absolute: 0.9 x10E3/uL (ref 0.1–0.9)
Monocytes: 10 %
Neutrophils Absolute: 5.3 x10E3/uL (ref 1.4–7.0)
Neutrophils: 61 %
Platelets: 235 x10E3/uL (ref 150–450)
RBC: 5.19 x10E6/uL (ref 4.14–5.80)
RDW: 13.2 % (ref 11.6–15.4)
WBC: 8.8 x10E3/uL (ref 3.4–10.8)

## 2024-03-11 LAB — PHOSPHORUS: Phosphorus: 4 mg/dL (ref 2.8–4.1)

## 2024-03-11 LAB — PSA: Prostate Specific Ag, Serum: 0.6 ng/mL (ref 0.0–4.0)

## 2024-03-11 LAB — LIPID PANEL W/O CHOL/HDL RATIO
Cholesterol, Total: 163 mg/dL (ref 100–199)
HDL: 65 mg/dL (ref >39)
LDL Chol Calc (NIH): 86 mg/dL (ref 0–99)
Triglycerides: 57 mg/dL (ref 0–149)
VLDL Cholesterol Cal: 12 mg/dL (ref 5–40)

## 2024-03-11 LAB — TSH: TSH: 1.26 u[IU]/mL (ref 0.450–4.500)

## 2024-03-11 LAB — MAGNESIUM: Magnesium: 2.4 mg/dL — ABNORMAL HIGH (ref 1.6–2.3)

## 2024-03-11 LAB — VITAMIN D 25 HYDROXY (VIT D DEFICIENCY, FRACTURES): Vit D, 25-Hydroxy: 43.9 ng/mL (ref 30.0–100.0)

## 2024-03-15 ENCOUNTER — Other Ambulatory Visit (INDEPENDENT_AMBULATORY_CARE_PROVIDER_SITE_OTHER): Payer: Self-pay | Admitting: Vascular Surgery

## 2024-03-15 DIAGNOSIS — I739 Peripheral vascular disease, unspecified: Secondary | ICD-10-CM

## 2024-03-17 ENCOUNTER — Ambulatory Visit (INDEPENDENT_AMBULATORY_CARE_PROVIDER_SITE_OTHER): Admitting: Vascular Surgery

## 2024-03-17 ENCOUNTER — Encounter (INDEPENDENT_AMBULATORY_CARE_PROVIDER_SITE_OTHER): Payer: Self-pay | Admitting: Vascular Surgery

## 2024-03-17 ENCOUNTER — Other Ambulatory Visit (INDEPENDENT_AMBULATORY_CARE_PROVIDER_SITE_OTHER)

## 2024-03-17 VITALS — BP 124/72 | HR 62 | Resp 18 | Ht 65.0 in | Wt 208.0 lb

## 2024-03-17 DIAGNOSIS — I739 Peripheral vascular disease, unspecified: Secondary | ICD-10-CM | POA: Diagnosis not present

## 2024-03-17 DIAGNOSIS — Z9889 Other specified postprocedural states: Secondary | ICD-10-CM

## 2024-03-17 DIAGNOSIS — J449 Chronic obstructive pulmonary disease, unspecified: Secondary | ICD-10-CM | POA: Diagnosis not present

## 2024-03-17 DIAGNOSIS — I70223 Atherosclerosis of native arteries of extremities with rest pain, bilateral legs: Secondary | ICD-10-CM

## 2024-03-17 DIAGNOSIS — E782 Mixed hyperlipidemia: Secondary | ICD-10-CM

## 2024-03-17 NOTE — Progress Notes (Unsigned)
 Subjective:    Patient ID: Mike [name]., male    DOB: Apr 16, 1956, 68 y.o.   MRN: [id number] No chief complaint on file.   Mike [name] is a 68 year old male who presents to clinic today for postop follow-up from the procedures listed below.  His last procedure was on 02/24/2024.  Patient endorses his legs feel much better today.  He has no issues with ambulation, walking, exercise.  He endorses today that he is no longer smoking.  He denies any pain to his lower extremities either at rest or with ambulation.  He denies any bilateral lower extremity swelling today.   Date of Surgery: 02/24/2024   Surgeon:  Cordella Shawl   Pre-operative Diagnosis: Atherosclerotic occlusive disease bilateral lower extremities with left lower extremity with rest pain and ulceration.   Post-operative diagnosis:  Same   Procedure(s) Performed:             1.  Introduction catheter into left lower extremity 3rd order catheter placement               2.    Contrast injection left lower extremity for distal runoff             3.  Percutaneous transluminal angioplasty and stent placement superficial femoral and popliteal arteries to 6 mm             4.  Percutaneous transluminal angioplasty and stent placement left anterior tibial to 4 mm             5.  Star close closure right common femoral arteriotomy             6.   Ultrasound-guided access to the left anterior tibial at the level of the ankle  Date of Surgery: 01/20/2024,10:20 AM   Surgeon:Schnier, Cordella MATSU    Pre-operative Diagnosis: Atherosclerotic occlusive disease bilateral lower extremities with rest pain of the left lower extremity   Post-operative diagnosis:  Same   Procedure(s) Performed:             1.  Abdominal aortogram             2.  Selective injection of the left lower extremity third order catheter placement             3.  Ultrasound-guided access to the right common femoral artery             4.  Celt right common  femoral artery      Review of Systems  Constitutional: Negative.   Cardiovascular:  Positive for leg swelling.  Musculoskeletal:  Positive for myalgias.  All other systems reviewed and are negative.      Objective:   Physical Exam Vitals reviewed.  Constitutional:      Appearance: Normal appearance. He is normal weight.  HENT:     Head: Normocephalic.  Eyes:     Pupils: Pupils are equal, round, and reactive to light.  Cardiovascular:     Rate and Rhythm: Normal rate and regular rhythm.     Pulses: Normal pulses.     Heart sounds: Normal heart sounds.  Pulmonary:     Effort: Pulmonary effort is normal.     Breath sounds: Normal breath sounds.  Abdominal:     General: Abdomen is flat. Bowel sounds are normal.     Palpations: Abdomen is soft.  Musculoskeletal:        General: Normal range of motion.  Skin:    General: Skin  is warm and dry.     Capillary Refill: Capillary refill takes 2 to 3 seconds.  Neurological:     General: No focal deficit present.     Mental Status: He is alert and oriented to person, place, and time. Mental status is at baseline.  Psychiatric:        Mood and Affect: Mood normal.        Behavior: Behavior normal.        Thought Content: Thought content normal.        Judgment: Judgment normal.     There were no vitals taken for this visit.  Past Medical History:  Diagnosis Date   Arthritis    Benign enlargement of prostate    CAD (coronary artery disease)    Chronic kidney disease    History of seizures as a child    Due to head injury   Hyperlipidemia    Hypertension    Lumbago    Myocardial infarction (HCC)    Urinary hesitancy     Social History   Socioeconomic History   Marital status: Divorced    Spouse name: Not on file   Number of children: 1   Years of education: Not on file   Highest education level: Some college, no degree  Occupational History   Occupation: retired  Tobacco Use   Smoking status: Former     Current packs/day: 0.00    Average packs/day: 1 pack/day for 30.0 years (30.0 ttl pk-yrs)    Types: Cigarettes    Start date: 04/12/1986    Quit date: 04/12/2016    Years since quitting: 7.9   Smokeless tobacco: Never   Tobacco comments:    since 15, max 1 ppd x10  Vaping Use   Vaping status: Never Used  Substance and Sexual Activity   Alcohol use: No    Alcohol/week: 0.0 standard drinks of alcohol   Drug use: No   Sexual activity: Yes    Birth control/protection: None  Other Topics Concern   Not on file  Social History Narrative   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    Social Drivers of Health   Financial Resource Strain: Low Risk  (09/09/2023)   Overall Financial Resource Strain (CARDIA)    Difficulty of Paying Living Expenses: Not hard at all  Food Insecurity: No Food Insecurity (09/09/2023)   Hunger Vital Sign    Worried About Running Out of Food in the Last Year: Never true    Ran Out of Food in the Last Year: Never true  Transportation Needs: No Transportation Needs (09/09/2023)   PRAPARE - Administrator, Civil Service (Medical): No    Lack of Transportation (Non-Medical): No  Physical Activity: Sufficiently Active (09/09/2023)   Exercise Vital Sign    Days of Exercise per Week: 7 days    Minutes of Exercise per Session: 120 min  Stress: No Stress Concern Present (09/09/2023)   [name] of Occupational Health - Occupational Stress Questionnaire    Feeling of Stress : Not at all  Social Connections: Socially Isolated (09/09/2023)   Social Connection and Isolation Panel    Frequency of Communication with Friends and Family: Once a week    Frequency of Social Gatherings with Friends and Family: More than three times a week    Attends Religious Services: Never    Database Administrator or Organizations: No    Attends Banker Meetings: Never  Marital Status: Divorced  Catering Manager Violence: Not At Risk (09/09/2023)    Humiliation, Afraid, Rape, and Kick questionnaire    Fear of Current or Ex-Partner: No    Emotionally Abused: No    Physically Abused: No    Sexually Abused: No    Past Surgical History:  Procedure Laterality Date   COLONOSCOPY WITH PROPOFOL  N/A 10/01/2019   Procedure: COLONOSCOPY WITH PROPOFOL ;  Surgeon: Unk Corinn Skiff, MD;  Location: [facility];  Service: Gastroenterology;  Laterality: N/A;   coronary artery stent     LOWER EXTREMITY ANGIOGRAPHY Left 01/20/2024   Procedure: Lower Extremity Angiography;  Surgeon: Jama Cordella MATSU, MD;  Location: [facility];  Service: Cardiovascular;  Laterality: Left;   LOWER EXTREMITY ANGIOGRAPHY Left 02/24/2024   Procedure: Lower Extremity Angiography;  Surgeon: Jama Cordella MATSU, MD;  Location: [facility];  Service: Cardiovascular;  Laterality: Left;   LOWER EXTREMITY INTERVENTION Left 01/20/2024   Procedure: LOWER EXTREMITY INTERVENTION;  Surgeon: Jama Cordella MATSU, MD;  Location: [facility];  Service: Cardiovascular;  Laterality: Left;   LOWER EXTREMITY INTERVENTION Left 02/24/2024   Procedure: LOWER EXTREMITY INTERVENTION;  Surgeon: Jama Cordella MATSU, MD;  Location: [facility];  Service: Cardiovascular;  Laterality: Left;    Family History  Problem Relation Age of Onset   Hypertension Mother    Breast cancer Mother    Hypertension Father    Stroke Father    Stroke Maternal Grandfather     Allergies  Allergen Reactions   Shellfish Allergy Anaphylaxis   Peanuts [Peanut Oil] Rash   Tuna [Fish Allergy] Hives       Latest Ref Rng & Units 03/10/2024    9:40 AM 09/08/2023    9:47 AM 03/07/2023    9:39 AM  CBC  WBC 3.4 - 10.8 x10E3/uL 8.8  6.4  6.2   Hemoglobin 13.0 - 17.7 g/dL 86.4  86.8  86.4   Hematocrit 37.5 - 51.0 % 41.3  41.0  43.2   Platelets 150 - 450 x10E3/uL 235  203  223       CMP     Component Value Date/Time   NA 137 03/10/2024 0940   K 4.7 03/10/2024 0940   CL 100  03/10/2024 0940   CO2 21 03/10/2024 0940   GLUCOSE 130 (H) 03/10/2024 0940   BUN 35 (H) 03/10/2024 0940   CREATININE 1.88 (H) 03/10/2024 0940   CALCIUM 9.6 03/10/2024 0940   PROT 6.8 03/10/2024 0940   ALBUMIN 4.0 03/10/2024 0940   AST 38 03/10/2024 0940   ALT 23 03/10/2024 0940   ALKPHOS 135 (H) 03/10/2024 0940   BILITOT 0.6 03/10/2024 0940   EGFR 38 (L) 03/10/2024 0940   GFRNONAA 43 (L) 02/24/2024 0927     No results found.     Assessment & Plan:   1. Atherosclerosis of native artery of both lower extremities with rest pain (HCC) (Primary) The patient returns to the office for followup regarding atherosclerotic changes of the lower extremities and review of the noninvasive studies.   There have been no interval changes in lower extremity symptoms. No interval shortening of the patient's claudication distance or development of rest pain symptoms. No new ulcers or wounds have occurred since the last visit.  There have been no significant changes to the patient's overall health care.  The patient denies amaurosis fugax or recent TIA symptoms. There are no documented recent neurological changes noted. There is no history of DVT, PE  or superficial thrombophlebitis. The patient denies recent episodes of angina or shortness of breath.   ABI Rt = 1.22 and Lt=0.88  (previous ABI's Rt=1.30 and Lt=1.90) Duplex ultrasound of the Bilateral lower extremities with normal wave forms   Patient will follow-up with vein and vascular surgery in 3 months with repeat bilateral lower extremity arterial duplex ultrasounds with ABIs.  2. Chronic obstructive pulmonary disease, unspecified COPD type (HCC) Continue pulmonary medications and aerosols as already ordered, these medications have been reviewed and there are no changes at this time.   3. Mixed hyperlipidemia Continue statin as ordered and reviewed, no changes at this time   Current Outpatient Medications on File Prior to Visit   Medication Sig Dispense Refill   albuterol  (VENTOLIN  HFA) 108 (90 Base) MCG/ACT inhaler INHALE TWO PUFFS BY MOUTH INTO LUNGS EVERY 6 HOURS AS NEEDED FOR WHEEZING OR FOR SHORTNESS OF BREATH 18 g 6   aspirin  EC 81 MG tablet Take 81 mg by mouth daily.      clopidogrel (PLAVIX) 75 MG tablet Take 1 tablet (75 mg total) by mouth daily. 90 tablet 1   EPINEPHrine  0.3 mg/0.3 mL IJ SOAJ injection Inject 0.3 mg into the muscle as needed for anaphylaxis. 2 each 3   lisinopril  (ZESTRIL ) 40 MG tablet Take 1 tablet (40 mg total) by mouth daily. 90 tablet 1   metoprolol  tartrate (LOPRESSOR ) 25 MG tablet TAKE ONE TABLET (25 MG TOTAL) BY MOUTH TWICE DAILY 180 tablet 0   simvastatin  (ZOCOR ) 40 MG tablet TAKE ONE TABLET (40 MG TOTAL) BY MOUTH DAILY AT 6PM 90 tablet 0   tadalafil  (CIALIS ) 20 MG tablet Take 0.5-1 tablets (10-20 mg total) by mouth every other day as needed for erectile dysfunction. 10 tablet 11   tamsulosin  (FLOMAX ) 0.4 MG CAPS capsule TAKE ONE CAPSULE (0.4 MG TOTAL) BY MOUTH DAILY 90 capsule 0   No current facility-administered medications on file prior to visit.    There are no Patient Instructions on file for this visit. No follow-ups on file.   Gwendlyn JONELLE Shank, NP

## 2024-03-18 LAB — VAS US ABI WITH/WO TBI
Left ABI: 0.88
Right ABI: 1.22

## 2024-03-20 ENCOUNTER — Other Ambulatory Visit: Payer: Self-pay | Admitting: Family Medicine

## 2024-03-22 NOTE — Telephone Encounter (Signed)
 Requested Prescriptions  Pending Prescriptions Disp Refills   lisinopril  (ZESTRIL ) 40 MG tablet [Pharmacy Med Name: lisinopril  40 mg tablet] 90 tablet 0    Sig: TAKE ONE TABLET (40 MG TOTAL) BY MOUTH ONCE DAILY     Cardiovascular:  ACE Inhibitors Failed - 03/22/2024  2:19 PM      Failed - Cr in normal range and within 180 days    Creatinine, Ser  Date Value Ref Range Status  03/10/2024 1.88 (H) 0.76 - 1.27 mg/dL Final         Passed - K in normal range and within 180 days    Potassium  Date Value Ref Range Status  03/10/2024 4.7 3.5 - 5.2 mmol/L Final         Passed - Patient is not pregnant      Passed - Last BP in normal range    BP Readings from Last 1 Encounters:  03/17/24 124/72         Passed - Valid encounter within last 6 months    Recent Outpatient Visits           1 week ago Benign hypertensive renal disease   [facility] [place], Megan P, DO   6 months ago Routine general medical examination at a health care facility   [facility] [place], Megan P, DO

## 2024-03-23 ENCOUNTER — Other Ambulatory Visit: Payer: Self-pay | Admitting: Family Medicine

## 2024-03-23 NOTE — Telephone Encounter (Unsigned)
 Copied from CRM [id number]. Topic: Clinical - Medication Refill >> Mar 23, 2024  5:10 PM Everette C wrote: Medication: lisinopril  (ZESTRIL ) 40 MG tablet [[id number]]  Has the patient contacted their pharmacy? Yes (Agent: If no, request that the patient contact the pharmacy for the refill. If patient does not wish to contact the pharmacy document the reason why and proceed with request.) (Agent: If yes, when and what did the pharmacy advise?)  This is the patient's preferred pharmacy:  [facility] [place] [address] [place] [address] Phone: [phone] Fax: [phone]  Is this the correct pharmacy for this prescription? Yes If no, delete pharmacy and type the correct one.   Has the prescription been filled recently? Yes  Is the patient out of the medication? Yes  Has the patient been seen for an appointment in the last year OR does the patient have an upcoming appointment? Yes  Can we respond through MyChart? No  Agent: Please be advised that Rx refills may take up to 3 business days. We ask that you follow-up with your pharmacy.

## 2024-03-23 NOTE — Telephone Encounter (Unsigned)
 Copied from CRM [id number]. Topic: Clinical - Medication Refill >> Mar 23, 2024  5:10 PM Everette C wrote: Medication: lisinopril  (ZESTRIL ) 40 MG tablet [[id number]]  Has the patient contacted their pharmacy? {yes/no:20286} (Agent: If no, request that the patient contact the pharmacy for the refill. If patient does not wish to contact the pharmacy document the reason why and proceed with request.) (Agent: If yes, when and what did the pharmacy advise?)  This is the patient's preferred pharmacy:  [facility] [place] [address] [place] [address] Phone: [phone] Fax: [phone]  Is this the correct pharmacy for this prescription? {yes/no:20286} If no, delete pharmacy and type the correct one.   Has the prescription been filled recently? {yes/no:20286}  Is the patient out of the medication? {yes/no:20286}  Has the patient been seen for an appointment in the last year OR does the patient have an upcoming appointment? {yes/no:20286}  Can we respond through MyChart? {yes/no:20286}  Agent: Please be advised that Rx refills may take up to 3 business days. We ask that you follow-up with your pharmacy.

## 2024-03-25 NOTE — Telephone Encounter (Signed)
 Requested Prescriptions  Pending Prescriptions Disp Refills   lisinopril  (ZESTRIL ) 40 MG tablet 90 tablet 0     Cardiovascular:  ACE Inhibitors Failed - 03/25/2024  1:42 PM      Failed - Cr in normal range and within 180 days    Creatinine, Ser  Date Value Ref Range Status  03/10/2024 1.88 (H) 0.76 - 1.27 mg/dL Final         Passed - K in normal range and within 180 days    Potassium  Date Value Ref Range Status  03/10/2024 4.7 3.5 - 5.2 mmol/L Final         Passed - Patient is not pregnant      Passed - Last BP in normal range    BP Readings from Last 1 Encounters:  03/17/24 124/72         Passed - Valid encounter within last 6 months    Recent Outpatient Visits           2 weeks ago Benign hypertensive renal disease   [place] [place], Megan P, DO   6 months ago Routine general medical examination at a health care facility   [facility], Megan P, DO

## 2024-03-25 NOTE — Telephone Encounter (Signed)
 Requested Prescriptions  Refused Prescriptions Disp Refills   lisinopril  (ZESTRIL ) 40 MG tablet 90 tablet 0     Cardiovascular:  ACE Inhibitors Failed - 03/25/2024  1:41 PM      Failed - Cr in normal range and within 180 days    Creatinine, Ser  Date Value Ref Range Status  03/10/2024 1.88 (H) 0.76 - 1.27 mg/dL Final         Passed - K in normal range and within 180 days    Potassium  Date Value Ref Range Status  03/10/2024 4.7 3.5 - 5.2 mmol/L Final         Passed - Patient is not pregnant      Passed - Last BP in normal range    BP Readings from Last 1 Encounters:  03/17/24 124/72         Passed - Valid encounter within last 6 months    Recent Outpatient Visits           2 weeks ago Benign hypertensive renal disease   [facility] [place], Megan P, DO   6 months ago Routine general medical examination at a health care facility   [facility], Megan P, DO

## 2024-03-31 ENCOUNTER — Other Ambulatory Visit

## 2024-03-31 DIAGNOSIS — N289 Disorder of kidney and ureter, unspecified: Secondary | ICD-10-CM

## 2024-04-01 LAB — BASIC METABOLIC PANEL WITH GFR
BUN/Creatinine Ratio: 17 (ref 10–24)
BUN: 24 mg/dL (ref 8–27)
CO2: 22 mmol/L (ref 20–29)
Calcium: 9.2 mg/dL (ref 8.6–10.2)
Chloride: 104 mmol/L (ref 96–106)
Creatinine, Ser: 1.44 mg/dL — ABNORMAL HIGH (ref 0.76–1.27)
Glucose: 127 mg/dL — ABNORMAL HIGH (ref 70–99)
Potassium: 4.3 mmol/L (ref 3.5–5.2)
Sodium: 139 mmol/L (ref 134–144)
eGFR: 53 mL/min/1.73 — ABNORMAL LOW (ref >59)

## 2024-04-12 ENCOUNTER — Ambulatory Visit: Payer: Self-pay | Admitting: Family Medicine

## 2024-05-21 ENCOUNTER — Other Ambulatory Visit: Payer: Self-pay | Admitting: Family Medicine

## 2024-06-05 ENCOUNTER — Other Ambulatory Visit: Payer: Self-pay | Admitting: Family Medicine

## 2024-06-07 NOTE — Telephone Encounter (Signed)
 Rx 18g 6RF- too soon Requested Prescriptions  Pending Prescriptions Disp Refills   albuterol  (VENTOLIN  HFA) 108 (90 Base) MCG/ACT inhaler [Pharmacy Med Name: albuterol  sulfate HFA 90 mcg/actuation aerosol inhaler] 6.7 g 6    Sig: INHALE TWO PUFFS BY MOUTH INTO THE LUNGS EVERY 6 HOURS AS NEEDED FOR SHORTNESS OF BREATH OR WHEEZING     Pulmonology:  Beta Agonists 2 Passed - 06/07/2024  2:10 PM      Passed - Last BP in normal range    BP Readings from Last 1 Encounters:  03/17/24 124/72         Passed - Last Heart Rate in normal range    Pulse Readings from Last 1 Encounters:  03/17/24 62         Passed - Valid encounter within last 12 months    Recent Outpatient Visits           2 months ago Benign hypertensive renal disease   [facility] [place], Megan P, DO   9 months ago Routine general medical examination at a health care facility   [facility], Megan P, DO

## 2024-06-17 ENCOUNTER — Other Ambulatory Visit (INDEPENDENT_AMBULATORY_CARE_PROVIDER_SITE_OTHER): Payer: Self-pay | Admitting: Nurse Practitioner

## 2024-06-17 DIAGNOSIS — I739 Peripheral vascular disease, unspecified: Secondary | ICD-10-CM

## 2024-06-18 ENCOUNTER — Encounter (INDEPENDENT_AMBULATORY_CARE_PROVIDER_SITE_OTHER)

## 2024-06-18 ENCOUNTER — Ambulatory Visit (INDEPENDENT_AMBULATORY_CARE_PROVIDER_SITE_OTHER): Admitting: Vascular Surgery

## 2024-06-21 ENCOUNTER — Other Ambulatory Visit: Payer: Self-pay | Admitting: Family Medicine

## 2024-06-22 NOTE — Telephone Encounter (Signed)
 Requested Prescriptions  Pending Prescriptions Disp Refills   lisinopril  (ZESTRIL ) 40 MG tablet [Pharmacy Med Name: lisinopril  40 mg tablet] 90 tablet 0    Sig: TAKE ONE TABLET (40 MG TOTAL) BY MOUTH ONCE DAILY     Cardiovascular:  ACE Inhibitors Failed - 06/22/2024 12:26 PM      Failed - Cr in normal range and within 180 days    Creatinine, Ser  Date Value Ref Range Status  03/31/2024 1.44 (H) 0.76 - 1.27 mg/dL Final         Passed - K in normal range and within 180 days    Potassium  Date Value Ref Range Status  03/31/2024 4.3 3.5 - 5.2 mmol/L Final         Passed - Patient is not pregnant      Passed - Last BP in normal range    BP Readings from Last 1 Encounters:  03/17/24 124/72         Passed - Valid encounter within last 6 months    Recent Outpatient Visits           3 months ago Benign hypertensive renal disease   [place] [facility] [place], Megan P, DO   9 months ago Routine general medical examination at a health care facility   [facility] [place], [place], DO

## 2024-09-09 ENCOUNTER — Encounter: Admitting: Family Medicine

## 2024-09-14 ENCOUNTER — Ambulatory Visit
